# Patient Record
Sex: Male | Born: 1960 | Race: White | Hispanic: No | Marital: Married | Smoking: Current some day smoker
Health system: Southern US, Community
[De-identification: ages and names within clinical notes are randomized; demographics above are authoritative.]

## PROBLEM LIST (undated history)

## (undated) DIAGNOSIS — E119 Type 2 diabetes mellitus without complications: Secondary | ICD-10-CM

## (undated) DIAGNOSIS — I69391 Dysphagia following cerebral infarction: Secondary | ICD-10-CM

## (undated) DIAGNOSIS — I69328 Other speech and language deficits following cerebral infarction: Secondary | ICD-10-CM

## (undated) DIAGNOSIS — I639 Cerebral infarction, unspecified: Secondary | ICD-10-CM

## (undated) DIAGNOSIS — R51 Headache: Secondary | ICD-10-CM

## (undated) DIAGNOSIS — T8859XA Other complications of anesthesia, initial encounter: Secondary | ICD-10-CM

## (undated) DIAGNOSIS — R569 Unspecified convulsions: Secondary | ICD-10-CM

## (undated) DIAGNOSIS — T4145XA Adverse effect of unspecified anesthetic, initial encounter: Secondary | ICD-10-CM

## (undated) DIAGNOSIS — I10 Essential (primary) hypertension: Secondary | ICD-10-CM

## (undated) HISTORY — DX: Cerebral infarction, unspecified: I63.9

## (undated) HISTORY — PX: CYSTECTOMY: SUR359

## (undated) HISTORY — PX: TONSILLECTOMY: SUR1361

## (undated) HISTORY — DX: Other speech and language deficits following cerebral infarction: I69.328

## (undated) HISTORY — DX: Unspecified convulsions: R56.9

## (undated) HISTORY — DX: Type 2 diabetes mellitus without complications: E11.9

## (undated) HISTORY — DX: Dysphagia following cerebral infarction: I69.391

## (undated) HISTORY — PX: HERNIA REPAIR: SHX51

## (undated) HISTORY — PX: WISDOM TOOTH EXTRACTION: SHX21

## (undated) HISTORY — DX: Headache: R51

## (undated) HISTORY — DX: Essential (primary) hypertension: I10

---

## 2005-06-30 ENCOUNTER — Ambulatory Visit: Payer: Self-pay | Admitting: Internal Medicine

## 2005-09-15 HISTORY — PX: CHOLECYSTECTOMY: SHX55

## 2006-03-03 ENCOUNTER — Ambulatory Visit: Payer: Self-pay | Admitting: Internal Medicine

## 2006-04-13 ENCOUNTER — Encounter: Admission: RE | Admit: 2006-04-13 | Discharge: 2006-04-13 | Payer: Self-pay | Admitting: Endocrinology

## 2006-04-17 ENCOUNTER — Ambulatory Visit: Payer: Self-pay | Admitting: Internal Medicine

## 2006-06-04 ENCOUNTER — Ambulatory Visit (HOSPITAL_COMMUNITY): Admission: RE | Admit: 2006-06-04 | Discharge: 2006-06-04 | Payer: Self-pay | Admitting: General Surgery

## 2006-07-01 ENCOUNTER — Encounter (INDEPENDENT_AMBULATORY_CARE_PROVIDER_SITE_OTHER): Payer: Self-pay | Admitting: *Deleted

## 2006-07-01 ENCOUNTER — Ambulatory Visit (HOSPITAL_COMMUNITY): Admission: RE | Admit: 2006-07-01 | Discharge: 2006-07-02 | Payer: Self-pay | Admitting: General Surgery

## 2006-10-21 ENCOUNTER — Ambulatory Visit: Payer: Self-pay | Admitting: Internal Medicine

## 2006-10-21 LAB — CONVERTED CEMR LAB
ALT: 21 units/L (ref 0–53)
AST: 14 units/L (ref 0–37)
Albumin: 4.5 g/dL (ref 3.5–5.2)
Basophils Absolute: 0 10*3/uL (ref 0.0–0.1)
CO2: 24 meq/L (ref 19–32)
Calcium: 9.3 mg/dL (ref 8.4–10.5)
Eosinophils Relative: 2 % (ref 0–5)
Folate: >20 ng/mL
HCT: 45.9 % (ref 39.0–52.0)
Lymphocytes Relative: 39 % (ref 12–46)
Neutro Abs: 4 10*3/uL (ref 1.7–7.7)
Neutrophils Relative %: 50 % (ref 43–77)
Phenytoin Lvl: 10.9 ug/mL (ref 10.0–20.0)
Platelets: 309 10*3/uL (ref 150–400)
Potassium: 4.4 meq/L (ref 3.5–5.3)
RDW: 13 % (ref 11.5–14.0)
Sodium: 139 meq/L (ref 135–145)
Total Protein: 7.6 g/dL (ref 6.0–8.3)

## 2007-03-02 ENCOUNTER — Encounter: Payer: Self-pay | Admitting: Internal Medicine

## 2007-03-02 DIAGNOSIS — R209 Unspecified disturbances of skin sensation: Secondary | ICD-10-CM

## 2007-03-02 DIAGNOSIS — G40309 Generalized idiopathic epilepsy and epileptic syndromes, not intractable, without status epilepticus: Secondary | ICD-10-CM | POA: Insufficient documentation

## 2007-03-02 DIAGNOSIS — I6350 Cerebral infarction due to unspecified occlusion or stenosis of unspecified cerebral artery: Secondary | ICD-10-CM | POA: Insufficient documentation

## 2007-03-05 ENCOUNTER — Encounter (INDEPENDENT_AMBULATORY_CARE_PROVIDER_SITE_OTHER): Payer: Self-pay | Admitting: *Deleted

## 2007-08-03 ENCOUNTER — Encounter: Payer: Self-pay | Admitting: Internal Medicine

## 2008-05-16 ENCOUNTER — Encounter (INDEPENDENT_AMBULATORY_CARE_PROVIDER_SITE_OTHER): Payer: Self-pay | Admitting: *Deleted

## 2008-09-11 ENCOUNTER — Encounter: Payer: Self-pay | Admitting: Internal Medicine

## 2009-02-15 ENCOUNTER — Encounter: Payer: Self-pay | Admitting: Internal Medicine

## 2010-01-28 ENCOUNTER — Encounter: Payer: Self-pay | Admitting: Internal Medicine

## 2010-04-02 ENCOUNTER — Encounter (INDEPENDENT_AMBULATORY_CARE_PROVIDER_SITE_OTHER): Payer: Self-pay | Admitting: *Deleted

## 2010-06-09 ENCOUNTER — Emergency Department (HOSPITAL_COMMUNITY): Admission: EM | Admit: 2010-06-09 | Discharge: 2010-06-09 | Payer: Self-pay | Admitting: Emergency Medicine

## 2010-10-15 NOTE — Letter (Signed)
Summary: Primary Care Appointment Letter  [facility]  [address] [place], [place] [address]   Phone: [phone]  Fax: [phone]    04/02/2010 MRN: [id number]  Bradley Davila [address] [place], [place]  [address]  Dear [name],   Your Primary Care Physician Willow Ora, MD has indicated that:    __X_____it is time to schedule an appointment.    _______you missed your appointment on______ and need to call and          reschedule.    _______you need to have lab work done.    _______you need to schedule an appointment discuss lab or test results.    _______you need to call to reschedule your appointment that is                       scheduled on _________.     Please call our office as soon as possible. Our phone number is [phone]          _________. Please press option 1. Our office is open 8a-12noon and 1p-5p, Monday through Friday.     Thank you,    [facility] Sched[facility]

## 2010-10-15 NOTE — Letter (Signed)
Summary: [facility]  [facility]   Imported By: Lanelle Bal 03/25/2010 12:26:45  _____________________________________________________________________  External Attachment:    Type:   Image     Comment:   External Document

## 2010-11-28 LAB — DIFFERENTIAL
Basophils Absolute: 0 10*3/uL (ref 0.0–0.1)
Lymphocytes Relative: 9 % — ABNORMAL LOW (ref 12–46)
Neutro Abs: 12.9 10*3/uL — ABNORMAL HIGH (ref 1.7–7.7)

## 2010-11-28 LAB — BASIC METABOLIC PANEL
BUN: 13 mg/dL (ref 6–23)
Creatinine, Ser: 1.02 mg/dL (ref 0.4–1.5)
GFR calc non Af Amer: >60 mL/min (ref >60)
Potassium: 4 mEq/L (ref 3.5–5.1)

## 2010-11-28 LAB — CBC
HCT: 45.3 % (ref 39.0–52.0)
Platelets: 303 10*3/uL (ref 150–400)
RDW: 13.4 % (ref 11.5–15.5)
WBC: 14.8 10*3/uL — ABNORMAL HIGH (ref 4.0–10.5)

## 2010-11-28 LAB — GLUCOSE, CAPILLARY: Glucose-Capillary: 96 mg/dL (ref 70–99)

## 2011-01-31 NOTE — Op Note (Signed)
NAME:  NYRON, MOZER NO.:  [id number]   MEDICAL RECORD NO.:  [id number]          PATIENT TYPE:  OIB   LOCATION:  1607                         FACILITY:  [facility]   PHYSICIAN:  Leonie Man, M.D.   DATE OF BIRTH:  08-23-1961   DATE OF PROCEDURE:  07/01/2006  DATE OF DISCHARGE:  07/02/2006                                 OPERATIVE REPORT   PREOPERATIVE DIAGNOSIS:  Chronic calculous cholecystitis.   POSTOPERATIVE DIAGNOSIS:  Chronic calculous cholecystitis.   PROCEDURE:  Laparoscopic cholecystectomy with intraoperative cholangiogram.   SURGEON:  Leonie Man, M.D.   ASSISTANT:  Baruch Merl, M.D.   ANESTHESIA:  General.   INDICATION:  The patient is a 50 year old man with a congenital tongue  deformity, who presents with persistent epigastric pain, nausea and  vomiting, ultrasonic evaluation showing multiple gallstones within the  gallbladder.  The patient's liver function studies are within normal limits.  He comes to the operating room now after risks and potential benefits of  surgery have been discussed, all questions answered and consent obtained.   PROCEDURE:  Following the induction of satisfactory anesthesia, the patient  was positioned supinely.  The abdomen was prepped and draped to be included  in a sterile operative field.  Open laparoscopy was created at the umbilicus  with insufflation of the peritoneal cavity to a maximum of 14 mmHg pressure  using carbon dioxide.  Camera was inserted and visual exploration of the  abdomen carried out.  Gallbladder was moderately chronically scarred.  Liver  edges were slightly rounded.  There were no lesions on the liver.  The  duodenal sweep appeared to be normal.  All small or large intestine viewed  appeared to be normal.   Epigastric and lateral ports were placed; the gallbladder was then grasped  and retracted cephalad.  Dissection was carried down to the region of the  ampulla with isolation of the  cystic artery and cystic duct.  The cystic  duct was clipped proximally and cystic artery was doubly clipped.  Cystic  duct cholangiogram was then carried out with 1/2-strength Renografin under  fluoroscopic control, resulting cholangiogram showing free flow of contrast  into the duodenum, no filling defects within the extrahepatic ducts.  The  cholangiocatheter was then removed from the gallbladder and the cystic duct  was doubly clipped and transected, the cystic artery doubly clipped and  transected in the gallbladder dissected free from the liver bed using  electrocautery and maintaining hemostasis throughout the course of the  dissection.  At the end of the dissection, the gallbladder is placed in an  Endopouch and retrieved through the umbilical port without difficulty.  The  right upper quadrant and subhepatic space were thoroughly irrigated with  multiple aliquots of normal saline, additional bleeding points treated with  electrocautery.  The pneumoperitoneum was then allowed to deflate and the  trocars removed under direct vision.  Sponge and instrument counts were  verified.  All wounds were closed in layers as follows with the 3-0 Vicryl  and 4-0 Monocryl for the  umbilicus.  The epigastric and lateral flank  wounds were both closed with 4-  0 Monocryl.  All wounds were reinforced with Steri-Strips and sterile  dressings applied, anesthetic reversed and the patient removed from the  operating room to the recovery room in stable condition.  He tolerated the  procedure well.      Leonie Man, M.D.  Electronically Signed     PB/MEDQ  D:  07/01/2006  T:  07/03/2006  Job:  [id number]

## 2011-04-17 ENCOUNTER — Ambulatory Visit (INDEPENDENT_AMBULATORY_CARE_PROVIDER_SITE_OTHER): Payer: BC Managed Care – PPO | Admitting: Family Medicine

## 2011-04-17 ENCOUNTER — Encounter: Payer: Self-pay | Admitting: Family Medicine

## 2011-04-17 DIAGNOSIS — I6350 Cerebral infarction due to unspecified occlusion or stenosis of unspecified cerebral artery: Secondary | ICD-10-CM

## 2011-04-17 DIAGNOSIS — Z8673 Personal history of transient ischemic attack (TIA), and cerebral infarction without residual deficits: Secondary | ICD-10-CM

## 2011-04-17 DIAGNOSIS — H9190 Unspecified hearing loss, unspecified ear: Secondary | ICD-10-CM | POA: Insufficient documentation

## 2011-04-17 DIAGNOSIS — L989 Disorder of the skin and subcutaneous tissue, unspecified: Secondary | ICD-10-CM

## 2011-04-17 DIAGNOSIS — R21 Rash and other nonspecific skin eruption: Secondary | ICD-10-CM

## 2011-04-17 DIAGNOSIS — G40309 Generalized idiopathic epilepsy and epileptic syndromes, not intractable, without status epilepticus: Secondary | ICD-10-CM

## 2011-04-17 MED ORDER — EPINEPHRINE 0.3 MG/0.3ML IJ DEVI: 0.3000 mg | Freq: Once | INTRAMUSCULAR | Status: DC

## 2011-04-17 MED ORDER — NYSTATIN 100000 UNIT/GM EX CREA: TOPICAL_CREAM | Freq: Two times a day (BID) | CUTANEOUS | Status: AC

## 2011-04-17 NOTE — Progress Notes (Deleted)
  Subjective:    Patient ID: Bradley Davila, male    DOB: October 13, 1960, 50 y.o.   MRN: [id number]  HPI    Review of Systems     Objective:   Physical Exam        Assessment & Plan:

## 2011-04-17 NOTE — Patient Instructions (Signed)
See Shirlee Limerick about your referral before your leave today. Use the nystatin twice a day on your armpits.  When the rash is better, schedule a office visit for me to take of the spot on your back and armpit.  We'll request your records.  Take care.  Glad to see you.  I would get a flu shot each fall.

## 2011-04-18 ENCOUNTER — Encounter: Payer: Self-pay | Admitting: Family Medicine

## 2011-04-18 NOTE — Assessment & Plan Note (Signed)
Requesting records.   

## 2011-04-18 NOTE — Assessment & Plan Note (Signed)
Likely irritated SK with skin tags.  Return for removal after axillary rash resolved .

## 2011-04-18 NOTE — Assessment & Plan Note (Signed)
Per neuro 

## 2011-04-18 NOTE — Assessment & Plan Note (Signed)
Refer for ENT eval.  Use irrigation at home for wax.

## 2011-04-18 NOTE — Progress Notes (Signed)
New pt to est care.  Requesting records.  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  Didn't hit dev milestones and developed SZ d/o.  Last gran mal 05/2011.  Followed by Neuro at [facility].  Dysphagia and speech changed.  Dec in grip at baseline.  Trouble with closing mouth--> drooling.  Now functionally L handed.  H/o occ mild dysphagia, but no recent choking.   Skin lesions.  Wants eval.  Dec in hearing, prev with high frequency loss on screen, but I don't have records yet.  No change in hearing with wax removal prev  PMH and SH reviewed  ROS: See HPI, otherwise noncontributory.  Meds, vitals, and allergies reviewed.   Nad, hard of hearing, speech at baseline per family ncat Mmm rrr ctab Inc in tone on R side but with sym DTRs.  Sensation grossly intact x4 Red rash in B axilla with satellite lesions. Skin tags noted in axilla SKs noted on back but 1 lesions has variable pigmentation.  On L side of back, ~41mm across.   Tm obscured by wax x2

## 2011-04-18 NOTE — Assessment & Plan Note (Signed)
Likely fungal.  Use nystatin and return as above.

## 2011-04-20 ENCOUNTER — Encounter: Payer: Self-pay | Admitting: Family Medicine

## 2011-05-11 ENCOUNTER — Encounter: Payer: Self-pay | Admitting: Family Medicine

## 2011-05-11 DIAGNOSIS — G809 Cerebral palsy, unspecified: Secondary | ICD-10-CM | POA: Insufficient documentation

## 2011-07-03 ENCOUNTER — Encounter: Payer: Self-pay | Admitting: Family Medicine

## 2013-03-21 DIAGNOSIS — G40209 Localization-related (focal) (partial) symptomatic epilepsy and epileptic syndromes with complex partial seizures, not intractable, without status epilepticus: Secondary | ICD-10-CM | POA: Diagnosis not present

## 2013-03-30 ENCOUNTER — Ambulatory Visit (INDEPENDENT_AMBULATORY_CARE_PROVIDER_SITE_OTHER)
Admission: RE | Admit: 2013-03-30 | Discharge: 2013-03-30 | Disposition: A | Discharge status: Home / Self Care | Payer: BC Managed Care – PPO | Source: Ambulatory Visit | Attending: Family Medicine | Admitting: Family Medicine

## 2013-03-30 ENCOUNTER — Ambulatory Visit (INDEPENDENT_AMBULATORY_CARE_PROVIDER_SITE_OTHER): Payer: BC Managed Care – PPO | Admitting: Family Medicine

## 2013-03-30 ENCOUNTER — Encounter: Payer: Self-pay | Admitting: Family Medicine

## 2013-03-30 VITALS — BP 120/70 | HR 85 | Temp 98.2°F | Ht 66.0 in | Wt 237.2 lb

## 2013-03-30 DIAGNOSIS — M25569 Pain in unspecified knee: Secondary | ICD-10-CM | POA: Diagnosis not present

## 2013-03-30 DIAGNOSIS — M25561 Pain in right knee: Secondary | ICD-10-CM

## 2013-03-30 MED ORDER — MELOXICAM 15 MG PO TABS: 15.0000 mg | ORAL_TABLET | Freq: Every day | ORAL | Status: DC

## 2013-03-30 MED ORDER — EPINEPHRINE 0.3 MG/0.3ML IJ SOAJ: 0.3000 mg | Freq: Once | INTRAMUSCULAR | Status: DC

## 2013-03-30 NOTE — Progress Notes (Signed)
Nature conservation officer at [facility] [address] [place] [address] Phone: [phone] Fax: [phone]  Date:  03/30/2013   Name:  Bradley Davila   DOB:  [birth year]   MRN:  [id number] Gender: male Age: 52 y.o.  Primary Physician:  Crawford Givens, MD  Evaluating MD: Hannah Beat, MD   Chief Complaint: Knee Pain   History of Present Illness:  Bradley Davila is a 52 y.o. pleasant patient who presents with the following:  68 yp s/p childhood CVA and CP with R knee pain.  R knee: Hurting kind of all over.  Has been walking to lose weight.  Walking seems to make it hurt more, hurts afterward.   Sometimes knee will swell Ice to help with swelling No prior operations.  No mechanical buckling, falling.  No known trauma    Patient Active Problem List   Diagnosis Date Noted  . Cerebral palsy 05/11/2011  . Hearing loss 04/17/2011  . Rash 04/17/2011  . Skin lesion 04/17/2011  . 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 STROKE 03/02/2007    Past Medical History  Diagnosis Date  . 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 Seizures     h/o grand mal and staring episodes, followed by [facility] neuro  . Headache(784.0)   . Dysphagia as late effect of stroke   . 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 Cholecystectomy    . Cystectomy      on buttock, s/p removal    History   Social History  . Marital Status: Married    Spouse Name: N/A    Number of Children: N/A  . Years of Education: N/A   Occupational History  . Not on file.   Social History Main Topics  . Smoking status: Never Smoker   . Smokeless tobacco: Not on file  . Alcohol Use: No  . Drug Use: No  . Sexually Active: Not on file   Other Topics Concern  . Not on file   Social History Narrative   Married 2005, 1 daughter   Disabled due to cva    Family History    Problem Relation Age of Onset  . Hydrocephalus Mother   . Liver cancer Father   . Alcohol abuse Father   . Alcohol abuse Sister   . Heart disease Brother   . Colon cancer Neg Hx   . Prostate cancer Neg Hx     Allergies  Allergen Reactions  . Benadryl (Diphenhydramine Hcl)     Caused seizures.   . Yellow Jacket Venom (Honey Bee Venom)     Facial swelling after sting  . Carbamazepine     Inc in seizures  . Lamotrigine     Inc in seizures  . Phenobarbital     rash    Medication list has been reviewed and updated.  Outpatient Prescriptions Prior to Visit  Medication Sig Dispense Refill  . EPINEPHrine (EPIPEN) 0.3 mg/0.3 mL DEVI Inject 0.3 mLs (0.3 mg total) into the muscle once.  2 Device  1  . levETIRAcetam (KEPPRA) 1000 MG tablet Take 1,000 mg by mouth. Take one in am and 1 1/2 in evening       . oxcarbazepine (TRILEPTAL) 600 MG tablet Take 600 mg by mouth 2 (two) times daily.        Marland Kitchen  Misc Natural Products (GINKOGIN PO) Take 120 mg by mouth daily.         No facility-administered medications prior to visit.    Review of Systems:   GEN: No fevers, chills. Nontoxic. Primarily MSK c/o today. MSK: Detailed in the HPI GI: tolerating PO intake without difficulty Neuro: No numbness, parasthesias, or tingling associated. Otherwise the pertinent positives of the ROS are noted above.    Physical Examination: BP 120/70  Pulse 85  Temp(Src) 98.2 F (36.8 C) (Oral)  Ht 5\' 6"  (1.676 m)  Wt 237 lb 4 oz (107.616 kg)  BMI 38.31 kg/m2  SpO2 97%  Ideal Body Weight: Weight in (lb) to have BMI = 25: 154.6   GEN: WDWN, NAD, Non-toxic, Alert & Oriented x 3 HEENT: Atraumatic, Normocephalic.  Ears and Nose: No external deformity. EXTR: No clubbing/cyanosis/edema NEURO: Normal gait.  PSYCH: Normally interactive. Conversant. Not depressed or anxious appearing.  Calm demeanor.   Knee:  R Gait: Normal heel toe pattern ROM: 0-110 Effusion: mild-mod effusion Echymosis or edema:  none Patellar tendon NT Painful PLICA: neg Patellar grind: + Medial and lateral patellar facet loading: mild t medial and lateral joint lines: medial > lateral pain Mcmurray's pain Flexion-pinch pain Varus and valgus stress: stable Lachman: neg Ant and Post drawer: neg Hip abduction, IR, ER: WNL Hip flexion str: 5/5 Hip abd: 5/5 Quad: 5/5 VMO atrophy:No Hamstring concentric and eccentric: 5/5   Dg Knee Ap/lat W/sunrise Right  03/30/2013   *RADIOLOGY REPORT*  Clinical Data: Right knee pain after fall.  DG KNEE - 3 VIEWS  Comparison: None.  Findings: No acute bony or joint abnormality is identified.  Tiny osteophyte off the lateral patellar facet is incidentally noted. No joint effusion is seen.  IMPRESSION: No acute finding.   Original Report Authenticated By: Holley Dexter, M.D.    Assessment and Plan:  Right knee pain - Plan: DG Knee AP/LAT W/Sunrise Right  Conservative in this patient. More likely degenerative meniscal, some PF compartment component as well.   Ice, NSAIDs, fitted with a patellar-J brace  F/u 4-5 weeks if not improving  Orders Today:  Orders Placed This Encounter  Procedures  . DG Knee AP/LAT W/Sunrise Right    Standing Status: Future     Number of Occurrences: 1     Standing Expiration Date: 05/30/2014    Order Specific Question:  Reason for exam:    Answer:  R knee pain,    Order Specific Question:  Preferred imaging location?    Answer:  [facility]    Updated Medication List: (Includes new medications, updates to list, dose adjustments) Meds ordered this encounter  Medications  . meloxicam (MOBIC) 15 MG tablet    Sig: Take 1 tablet (15 mg total) by mouth daily.    Dispense:  30 tablet    Refill:  2  . EPINEPHrine (EPIPEN) 0.3 mg/0.3 mL SOAJ    Sig: Inject 0.3 mLs (0.3 mg total) into the muscle once.    Dispense:  2 Device    Refill:  0    Medications Discontinued: Medications Discontinued During This Encounter  Medication  Reason  . Misc Natural Products (GINKOGIN PO) Error  . EPINEPHrine (EPIPEN) 0.3 mg/0.3 mL DEVI       Signed, Delfino Friesen T. Suriah Peragine, MD 03/30/2013 3:57 PM

## 2013-07-15 ENCOUNTER — Ambulatory Visit (INDEPENDENT_AMBULATORY_CARE_PROVIDER_SITE_OTHER): Payer: BC Managed Care – PPO | Admitting: Family Medicine

## 2013-07-15 ENCOUNTER — Encounter: Payer: Self-pay | Admitting: Family Medicine

## 2013-07-15 VITALS — BP 122/82 | HR 95 | Temp 98.1°F | Wt 235.2 lb

## 2013-07-15 DIAGNOSIS — E119 Type 2 diabetes mellitus without complications: Secondary | ICD-10-CM | POA: Diagnosis not present

## 2013-07-15 DIAGNOSIS — R739 Hyperglycemia, unspecified: Secondary | ICD-10-CM

## 2013-07-15 DIAGNOSIS — R7309 Other abnormal glucose: Secondary | ICD-10-CM

## 2013-07-15 LAB — COMPREHENSIVE METABOLIC PANEL
ALT: 37 U/L (ref 0–53)
CO2: 28 mEq/L (ref 19–32)
Calcium: 9.2 mg/dL (ref 8.4–10.5)
Chloride: 99 mEq/L (ref 96–112)
GFR: 93.58 mL/min (ref >60.00)
Sodium: 135 mEq/L (ref 135–145)
Total Protein: 7.7 g/dL (ref 6.0–8.3)

## 2013-07-15 NOTE — Progress Notes (Signed)
Thirsty, sugars up to 200-300 at home, wife had checked.  Peeing more often.  Started in about 2 months ago.  No FCNAVD.  Had been drinking soda but they tried to cut that back.  Discussed path/phys and diet options.    Meds, vitals, and allergies reviewed.   ROS: See HPI.  Otherwise, noncontributory.  Drooling and speech at baseline nad rrr ctab abd soft Ext w/o edema

## 2013-07-15 NOTE — Patient Instructions (Signed)
Go to the lab on the way out.  We'll contact you with your lab report. Avoid sugary drinks and bread/pastries/chips/white pasta for now.

## 2013-07-17 ENCOUNTER — Other Ambulatory Visit: Payer: Self-pay | Admitting: Family Medicine

## 2013-07-17 DIAGNOSIS — E1169 Type 2 diabetes mellitus with other specified complication: Secondary | ICD-10-CM | POA: Insufficient documentation

## 2013-07-17 DIAGNOSIS — E119 Type 2 diabetes mellitus without complications: Secondary | ICD-10-CM

## 2013-07-17 DIAGNOSIS — E1165 Type 2 diabetes mellitus with hyperglycemia: Secondary | ICD-10-CM | POA: Insufficient documentation

## 2013-07-17 MED ORDER — METFORMIN HCL 500 MG PO TABS: ORAL_TABLET | ORAL | Status: DC

## 2013-07-17 NOTE — Assessment & Plan Note (Signed)
New dx, path/phys d/w pt and wife.  Will need low carb diet, close f/u; see notes on labs. >25 min spent with face to face with patient, >50% counseling and/or coordinating care.

## 2013-07-19 ENCOUNTER — Other Ambulatory Visit: Payer: Self-pay | Admitting: Family Medicine

## 2013-07-19 DIAGNOSIS — E119 Type 2 diabetes mellitus without complications: Secondary | ICD-10-CM

## 2013-08-23 ENCOUNTER — Ambulatory Visit: Payer: Medicare Other

## 2013-08-30 ENCOUNTER — Ambulatory Visit: Payer: Medicare Other

## 2013-09-06 ENCOUNTER — Ambulatory Visit: Payer: Medicare Other

## 2013-10-04 ENCOUNTER — Encounter: Payer: BC Managed Care – PPO | Attending: Family Medicine

## 2013-10-04 ENCOUNTER — Encounter (INDEPENDENT_AMBULATORY_CARE_PROVIDER_SITE_OTHER): Payer: Self-pay

## 2013-10-04 VITALS — Ht 66.0 in | Wt 227.0 lb

## 2013-10-04 DIAGNOSIS — Z713 Dietary counseling and surveillance: Secondary | ICD-10-CM | POA: Diagnosis not present

## 2013-10-04 DIAGNOSIS — E119 Type 2 diabetes mellitus without complications: Secondary | ICD-10-CM | POA: Diagnosis not present

## 2013-10-05 NOTE — Progress Notes (Signed)
Patient was seen on 10/04/13 for the first of a series of three diabetes self-management courses at [facility].  Current HbA1c: 10.0%  The following learning objectives were met by the patient during this class:  Describe diabetes  State some common risk factors for diabetes  Defines the role of glucose and insulin  Identifies type of diabetes and pathophysiology  Describe the relationship between diabetes and cardiovascular risk  State the members of the Healthcare Team  States the rationale for glucose monitoring  State when to test glucose  State their individual Target Range  State the importance of logging glucose readings  Describe how to interpret glucose readings  Identifies A1C target  Explain the correlation between A1c and eAG values  State symptoms and treatment of high blood glucose  State symptoms and treatment of low blood glucose  Explain proper technique for glucose testing  Identifies proper sharps disposal  Handouts given during class include:  Living Well with Diabetes book  Carb Counting and Meal Planning book  Meal Plan Card  Carbohydrate guide  Meal planning worksheet  Low Sodium Flavoring Tips  The diabetes portion plate  Z3G to eAG Conversion Chart  Diabetes Medications  Stress Management  Diabetes Recommended Care Schedule  Diabetes Success Plan  Core Class Satisfaction Survey  Follow-Up Plan:  Attend core 2

## 2013-10-11 ENCOUNTER — Other Ambulatory Visit (INDEPENDENT_AMBULATORY_CARE_PROVIDER_SITE_OTHER): Payer: BC Managed Care – PPO

## 2013-10-11 DIAGNOSIS — E119 Type 2 diabetes mellitus without complications: Secondary | ICD-10-CM | POA: Diagnosis not present

## 2013-10-11 LAB — HEMOGLOBIN A1C: HEMOGLOBIN A1C: 6.9 % — AB (ref 4.6–6.5)

## 2013-10-11 LAB — MICROALBUMIN / CREATININE URINE RATIO
Creatinine,U: 184.7 mg/dL
MICROALB UR: 3.3 mg/dL — AB (ref 0.0–1.9)
MICROALB/CREAT RATIO: 1.8 mg/g (ref 0.0–30.0)

## 2013-10-12 NOTE — Progress Notes (Signed)

## 2013-10-13 ENCOUNTER — Other Ambulatory Visit: Payer: Medicare Other

## 2013-10-17 ENCOUNTER — Encounter: Payer: Self-pay | Admitting: Family Medicine

## 2013-10-17 ENCOUNTER — Ambulatory Visit (INDEPENDENT_AMBULATORY_CARE_PROVIDER_SITE_OTHER): Payer: BC Managed Care – PPO | Admitting: Family Medicine

## 2013-10-17 VITALS — BP 118/74 | HR 68 | Temp 98.4°F | Wt 224.5 lb

## 2013-10-17 DIAGNOSIS — E119 Type 2 diabetes mellitus without complications: Secondary | ICD-10-CM | POA: Diagnosis not present

## 2013-10-17 DIAGNOSIS — G40209 Localization-related (focal) (partial) symptomatic epilepsy and epileptic syndromes with complex partial seizures, not intractable, without status epilepticus: Secondary | ICD-10-CM | POA: Diagnosis not present

## 2013-10-17 MED ORDER — METFORMIN HCL 500 MG PO TABS: ORAL_TABLET | ORAL | Status: DC

## 2013-10-17 NOTE — Patient Instructions (Addendum)
Call about getting an eye exam.   Recheck in about 3 months.  Cut back on the metformin (500mg  twice a day) if needed.  I would get a flu and pneumonia shot in the near future.  Take care.  Glad to see you.

## 2013-10-17 NOTE — Assessment & Plan Note (Signed)
Declined flu and PNA vaccine.  A1c improved, continue work on diet.  May need to cut metformin to 1000mg  a day, based on sugars and GI tolerance. Recheck in 3 months.  He agrees. Labs d/w pt.

## 2013-10-17 NOTE — Progress Notes (Signed)
Pre-visit discussion using our clinic review tool. No additional management support is needed unless otherwise documented below in the visit note.  Diabetes:  Using medications without difficulties:yes Hypoglycemic episodes:no Hyperglycemic episodes:no Feet problems:no Blood Sugars averaging: ~120-140s eye exam within last year: due, discussed.   A1c much improved.  Cut out soda and kool aid, feels much better.    Declined vaccination today, "I don't trust them."  PMH and SH reviewed  ROS: See HPI, otherwise noncontributory.  Meds, vitals, and allergies reviewed.    GEN: nad, alert and oriented, speech at baseline HEENT: mucous membranes moist NECK: supple w/o LA CV: rrr. PULM: ctab, no inc wob ABD: soft, +bs EXT: no edema  Diabetic foot exam: Normal inspection No skin breakdown No calluses  Normal DP pulses Normal sensation to light touch and monofilament Nails normal

## 2013-10-18 ENCOUNTER — Encounter: Payer: BC Managed Care – PPO | Attending: Family Medicine

## 2013-10-18 DIAGNOSIS — E119 Type 2 diabetes mellitus without complications: Secondary | ICD-10-CM | POA: Diagnosis not present

## 2013-10-18 DIAGNOSIS — Z713 Dietary counseling and surveillance: Secondary | ICD-10-CM | POA: Diagnosis not present

## 2013-10-18 NOTE — Progress Notes (Signed)
Patient was seen on 10/18/13 for the third of a series of three diabetes self-management courses at the [facility]. The following learning objectives were met by the patient during this class:    State the amount of activity recommended for healthy living   Describe activities suitable for individual needs   Identify ways to regularly incorporate activity into daily life   Identify barriers to activity and ways to over come these barriers  Identify diabetes medications being personally used and their primary action for lowering glucose and possible side effects   Describe role of stress on blood glucose and develop strategies to address psychosocial issues   Identify diabetes complications and ways to prevent them  Explain how to manage diabetes during illness   Evaluate success in meeting personal goal   Establish 2-3 goals that they will plan to diligently work on until they return for the  45-month follow-up visit  Goals:  Follow Diabetes Meal Plan as instructed  Aim for 15-30 mins of physical activity daily as tolerated  Bring food record and glucose log to your follow up visit  Your patient has established the following 4 month goals in their individualized success plan: I will count my carb choices at most meals and snacks I will increase my activity level at least 5 days a week I will take my diabetes medications as scheduled  I will stop smoking by 2 years  Your patient has identified these potential barriers to change:  None stated  Your patient has identified their diabetes self-care support plan as  None stated

## 2013-10-20 ENCOUNTER — Telehealth: Payer: Self-pay

## 2013-10-20 NOTE — Telephone Encounter (Signed)
Relevant patient education mailed to patient.  

## 2014-01-11 ENCOUNTER — Ambulatory Visit (INDEPENDENT_AMBULATORY_CARE_PROVIDER_SITE_OTHER): Payer: BC Managed Care – PPO | Admitting: Family Medicine

## 2014-01-11 ENCOUNTER — Encounter: Payer: Self-pay | Admitting: Family Medicine

## 2014-01-11 VITALS — BP 124/80 | HR 74 | Temp 98.4°F | Wt 227.0 lb

## 2014-01-11 DIAGNOSIS — R1032 Left lower quadrant pain: Secondary | ICD-10-CM | POA: Diagnosis not present

## 2014-01-11 MED ORDER — METRONIDAZOLE 500 MG PO TABS: 500.0000 mg | ORAL_TABLET | Freq: Three times a day (TID) | ORAL | Status: DC

## 2014-01-11 MED ORDER — CIPROFLOXACIN HCL 500 MG PO TABS
500.0000 mg | ORAL_TABLET | Freq: Two times a day (BID) | ORAL | Status: DC
Start: 2014-01-11 — End: 2014-02-24

## 2014-01-11 NOTE — Patient Instructions (Signed)
Start the antibiotics today.  Go to the lab on the way out.  We'll contact you with your lab report. Rosaria Ferries will call about your referral. Notify us if not improving in a few days.  If profound pain or bleeding, then to ER.  Take care.

## 2014-01-11 NOTE — Progress Notes (Signed)
Pre visit review using our clinic review tool, if applicable. No additional management support is needed unless otherwise documented below in the visit note.  Rectal bleeding.  He had some LLQ pain for a few days.  His sx resolved and then Monday of this week he had some blood in stool.  Going on for about three days.  Noted again today.  BRBPR.  No pain now.  No fevers.  No vomiting.  Hard stools recently.  No rectal pain.    Meds, vitals, and allergies reviewed.   ROS: See HPI.  Otherwise, noncontributory.  nad Speech at baseline rrr ctab abd soft, mildly ttp in LLQ w/o rebound, normal BS Ext well perfused.  Rectal exam w/o gross blood or hemorrhoids noted.

## 2014-01-11 NOTE — Assessment & Plan Note (Signed)
Concern for mild diverticulitis.  No hemorrhoid to assign the bleeding to.  D/w pt and wife.  Would start abx with routine cautions and refer pt to GI for colon cancer screening given the gross blood prev.  He agrees.

## 2014-01-12 LAB — CBC WITH DIFFERENTIAL/PLATELET
Basophils Absolute: 0.1 10*3/uL (ref 0.0–0.1)
Basophils Relative: 0.8 % (ref 0.0–3.0)
EOS PCT: 1.1 % (ref 0.0–5.0)
Eosinophils Absolute: 0.1 10*3/uL (ref 0.0–0.7)
HEMATOCRIT: 45.8 % (ref 39.0–52.0)
HEMOGLOBIN: 15.3 g/dL (ref 13.0–17.0)
LYMPHS ABS: 2.9 10*3/uL (ref 0.7–4.0)
Lymphocytes Relative: 24.6 % (ref 12.0–46.0)
MCHC: 33.4 g/dL (ref 30.0–36.0)
MCV: 86.8 fl (ref 78.0–100.0)
MONO ABS: 0.6 10*3/uL (ref 0.1–1.0)
MONOS PCT: 4.7 % (ref 3.0–12.0)
NEUTROS ABS: 8.1 10*3/uL — AB (ref 1.4–7.7)
Neutrophils Relative %: 68.8 % (ref 43.0–77.0)
Platelets: 383 10*3/uL (ref 150.0–400.0)
RBC: 5.28 Mil/uL (ref 4.22–5.81)
RDW: 13.6 % (ref 11.5–14.6)
WBC: 11.8 10*3/uL — AB (ref 4.5–10.5)

## 2014-01-12 LAB — COMPREHENSIVE METABOLIC PANEL
ALK PHOS: 78 U/L (ref 39–117)
ALT: 23 U/L (ref 0–53)
AST: 18 U/L (ref 0–37)
Albumin: 4.1 g/dL (ref 3.5–5.2)
BILIRUBIN TOTAL: 0.2 mg/dL — AB (ref 0.3–1.2)
BUN: 13 mg/dL (ref 6–23)
CO2: 30 meq/L (ref 19–32)
CREATININE: 1.1 mg/dL (ref 0.4–1.5)
Calcium: 9.2 mg/dL (ref 8.4–10.5)
Chloride: 101 mEq/L (ref 96–112)
GFR: 74.88 mL/min (ref >60.00)
Glucose, Bld: 106 mg/dL — ABNORMAL HIGH (ref 70–99)
Potassium: 4.5 mEq/L (ref 3.5–5.1)
SODIUM: 137 meq/L (ref 135–145)
Total Protein: 8 g/dL (ref 6.0–8.3)

## 2014-01-18 ENCOUNTER — Encounter: Payer: Self-pay | Admitting: Internal Medicine

## 2014-02-24 ENCOUNTER — Ambulatory Visit (INDEPENDENT_AMBULATORY_CARE_PROVIDER_SITE_OTHER): Payer: BC Managed Care – PPO | Admitting: Internal Medicine

## 2014-02-24 ENCOUNTER — Encounter: Payer: Self-pay | Admitting: Internal Medicine

## 2014-02-24 VITALS — BP 110/70 | HR 66 | Ht 66.0 in | Wt 227.4 lb

## 2014-02-24 DIAGNOSIS — E119 Type 2 diabetes mellitus without complications: Secondary | ICD-10-CM

## 2014-02-24 DIAGNOSIS — K625 Hemorrhage of anus and rectum: Secondary | ICD-10-CM

## 2014-02-24 DIAGNOSIS — R109 Unspecified abdominal pain: Secondary | ICD-10-CM

## 2014-02-24 MED ORDER — MOVIPREP 100 G PO SOLR: 1.0000 | Freq: Once | ORAL | Status: DC

## 2014-02-24 NOTE — Patient Instructions (Addendum)
You have been scheduled for a CT scan of the abdomen and pelvis at [facility] ([address]---this is in the same building as Press photographer).   You are scheduled on 03/02/2014 at 11:00am. You should arrive 15 minutes prior to your appointment time for registration. Please follow the written instructions below on the day of your exam:  WARNING: IF YOU ARE ALLERGIC TO IODINE/X-RAY DYE, PLEASE NOTIFY RADIOLOGY IMMEDIATELY AT [phone]! YOU WILL BE GIVEN A 13 HOUR PREMEDICATION PREP.  1) Do not eat or drink anything after 7:00am (4 hours prior to your test) 2) You have been given 2 bottles of oral contrast to drink. The solution may taste better if refrigerated, but do NOT add ice or any other liquid to this solution. Shake well before drinking.    Drink 1 bottle of contrast @ 9:00am (2 hours prior to your exam)  Drink 1 bottle of contrast @ 10:00am(1 hour prior to your exam)  You may take any medications as prescribed with a small amount of water except for the following: Metformin, Glucophage, Glucovance, Avandamet, Riomet, Fortamet, Actoplus Met, Janumet, Glumetza or Metaglip. The above medications must be held the day of the exam AND 48 hours after the exam.  The purpose of you drinking the oral contrast is to aid in the visualization of your intestinal tract. The contrast solution may cause some diarrhea. Before your exam is started, you will be given a small amount of fluid to drink. Depending on your individual set of symptoms, you may also receive an intravenous injection of x-ray contrast/dye. Plan on being at [facility] for 30 minutes or long, depending on the type of exam you are having performed.  If you have any questions regarding your exam or if you need to reschedule, you may call the CT department at [phone] between the hours of 8:00 am and 5:00 pm, Monday-Friday.   You have been scheduled for a colonoscopy with propofol. Please follow written  instructions given to you at your visit today.  Please pick up your prep kit at the pharmacy within the next 1-3 days. If you use inhalers (even only as needed), please bring them with you on the day of your procedure. Your physician has requested that you go to www.startemmi.com and enter the access code given to you at your visit today. This web site gives a general overview about your procedure. However, you should still follow specific instructions given to you by our office regarding your preparation for the procedure.   ________________________________________________________________________

## 2014-02-24 NOTE — Progress Notes (Signed)
HISTORY OF PRESENT ILLNESS:  Bradley Davila is a 53 y.o. 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 Damita Dunnings as a new GI patient regarding left lower quadrant pain, rectal bleeding, and the need for colonoscopy. He is accompanied by his wife. Patient reports having undergone a colonoscopy greater than 15 years ago. Apparently no abnormalities. In December 2014 he developed left lower quadrant pain. His diet was modified for a few days and that resolved. The pain returned in March along with rectal bleeding. Since that time he has had intermittent waves of lower abdominal discomfort. No nausea, vomiting, or weight loss. Review of outside laboratories from April 2015 revealed essentially normal CBC with differential and comprehensive metabolic panel. Hemoglobin A1c 6.9  REVIEW OF SYSTEMS:  All non-GI ROS negative except for depression, hearing problems, itching, muscle cramps, sleeping problems Past Medical History  Diagnosis Date  . 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 Seizures     h/o grand mal and staring episodes, followed by [facility] neuro  . Headache(784.0)   . Dysphagia as late effect of stroke   . 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 Cholecystectomy    . Cystectomy      on buttock, s/p removal    Social History Bradley Davila  reports that he has quit smoking. He has never used smokeless tobacco. He reports that he does not drink alcohol or use illicit drugs.  family history includes Alcohol abuse in his father and sister; Heart disease in his brother; Hydrocephalus in his mother; Liver cancer in his father. There is no history of Colon cancer or Prostate cancer.  Allergies  Allergen Reactions  . Benadryl [Diphenhydramine Hcl]    Caused seizures.   Jaquelyn Bitter Jacket Venom [Honey Bee Venom]     Facial swelling after sting  . Carbamazepine     Inc in seizures  . Lamotrigine     Inc in seizures  . Metformin And Related     Tolerates 1557m but not 20035ma day  . Phenobarbital     rash       PHYSICAL EXAMINATION: Vital signs: BP 110/70  Pulse 66  Ht _0  (1.676 m)  Wt 227 lb 6.4 oz (103.148 kg)  BMI 36.72 kg/m2  Constitutional: generally well-appearing, no acute distress Psychiatric: alert and oriented x3, cooperative Eyes: extraocular movements intact, anicteric, conjunctiva pink Mouth: oral pharynx moist, no lesions. Poor dentition Neck: supple no lymphadenopathy Cardiovascular: heart regular rate and rhythm, no murmur Lungs: clear to auscultation bilaterally Abdomen: soft, nontender, nondistended, no obvious ascites, no peritoneal signs, normal bowel sounds, no organomegaly Rectal: Deferred until colonoscopy Extremities: no lower extremity edema bilaterally Skin: no lesions on visible extremities Neuro:  Slurred speech.   ASSESSMENT:  #1. Six-month history of intermittent and progressive lower abdominal pain #2. Intermittent rectal bleeding #3. [place] colon[place] greater than [place] ago [place] #4. Multiple medical problems including diabetes  PLAN:  #1. Contrast-enhanced CT scan of the abdomen and pelvis #2. Colonoscopy thereafter. Movi prep prescribed.The nature of the procedure, as well as the risks, benefits, and alternatives were carefully and thoroughly reviewed with the patient. Ample time for discussion and questions allowed. The patient understood, was satisfied, and agreed to proceed. #3.  Would hold metformin the day of the procedure #4. Ongoing general care with Dr. Damita Dunnings

## 2014-02-28 ENCOUNTER — Encounter: Payer: Self-pay | Admitting: Internal Medicine

## 2014-03-02 ENCOUNTER — Ambulatory Visit (INDEPENDENT_AMBULATORY_CARE_PROVIDER_SITE_OTHER)
Admission: RE | Admit: 2014-03-02 | Discharge: 2014-03-02 | Disposition: A | Discharge status: Home / Self Care | Payer: BC Managed Care – PPO | Source: Ambulatory Visit | Attending: Internal Medicine | Admitting: Internal Medicine

## 2014-03-02 DIAGNOSIS — R109 Unspecified abdominal pain: Secondary | ICD-10-CM | POA: Diagnosis not present

## 2014-03-02 DIAGNOSIS — K429 Umbilical hernia without obstruction or gangrene: Secondary | ICD-10-CM | POA: Diagnosis not present

## 2014-03-02 MED ORDER — IOHEXOL 300 MG/ML  SOLN
100.0000 mL | Freq: Once | INTRAMUSCULAR | Status: AC | PRN
  Administered 2014-03-02: 100 mL via INTRAVENOUS

## 2014-03-15 ENCOUNTER — Telehealth: Payer: Self-pay | Admitting: Internal Medicine

## 2014-03-15 NOTE — Telephone Encounter (Signed)
Yes pt will need another previsit for updated instructions and to sign updated consents. Please schedule pt for previsit.

## 2014-03-21 ENCOUNTER — Encounter: Payer: Self-pay | Admitting: Internal Medicine

## 2014-03-21 ENCOUNTER — Encounter: Payer: BC Managed Care – PPO | Admitting: Internal Medicine

## 2014-03-21 NOTE — Telephone Encounter (Signed)
Left message for pt. Previsit scheduled for  Friday 06-02-14 at [place] to call back if this is not a good day or time.

## 2014-03-30 ENCOUNTER — Other Ambulatory Visit: Payer: Self-pay | Admitting: Family Medicine

## 2014-03-30 ENCOUNTER — Other Ambulatory Visit (INDEPENDENT_AMBULATORY_CARE_PROVIDER_SITE_OTHER): Payer: BC Managed Care – PPO

## 2014-03-30 DIAGNOSIS — E119 Type 2 diabetes mellitus without complications: Secondary | ICD-10-CM

## 2014-03-30 DIAGNOSIS — E1365 Other specified diabetes mellitus with hyperglycemia: Secondary | ICD-10-CM

## 2014-03-30 LAB — LIPID PANEL
Cholesterol: 200 mg/dL (ref 0–200)
HDL: 40.1 mg/dL (ref >39.00)
LDL Cholesterol: 132 mg/dL — ABNORMAL HIGH (ref 0–99)
NonHDL: 159.9
TRIGLYCERIDES: 140 mg/dL (ref 0.0–149.0)
Total CHOL/HDL Ratio: 5
VLDL: 28 mg/dL (ref 0.0–40.0)

## 2014-03-30 LAB — HEMOGLOBIN A1C: Hgb A1c MFr Bld: 6.9 % — ABNORMAL HIGH (ref 4.6–6.5)

## 2014-03-31 LAB — MICROALBUMIN / CREATININE URINE RATIO
CREATININE, URINE: 237 mg/dL
Microalb Creat Ratio: 13.3 mg/g (ref 0.0–30.0)
Microalb, Ur: 3.15 mg/dL — ABNORMAL HIGH (ref 0.00–1.89)

## 2014-04-02 ENCOUNTER — Encounter: Payer: Self-pay | Admitting: Family Medicine

## 2014-04-13 ENCOUNTER — Ambulatory Visit (INDEPENDENT_AMBULATORY_CARE_PROVIDER_SITE_OTHER): Payer: BC Managed Care – PPO | Admitting: Family Medicine

## 2014-04-13 ENCOUNTER — Encounter: Payer: Self-pay | Admitting: Family Medicine

## 2014-04-13 VITALS — BP 114/76 | HR 80 | Temp 98.3°F | Wt 225.8 lb

## 2014-04-13 DIAGNOSIS — R21 Rash and other nonspecific skin eruption: Secondary | ICD-10-CM

## 2014-04-13 DIAGNOSIS — H6121 Impacted cerumen, right ear: Secondary | ICD-10-CM

## 2014-04-13 DIAGNOSIS — E119 Type 2 diabetes mellitus without complications: Secondary | ICD-10-CM

## 2014-04-13 DIAGNOSIS — H6120 Impacted cerumen, unspecified ear: Secondary | ICD-10-CM | POA: Diagnosis not present

## 2014-04-13 MED ORDER — FLUOCINONIDE 0.05 % EX CREA: 1.0000 "application " | TOPICAL_CREAM | Freq: Two times a day (BID) | CUTANEOUS | Status: DC

## 2014-04-13 MED ORDER — LISINOPRIL 2.5 MG PO TABS: 2.5000 mg | ORAL_TABLET | Freq: Every day | ORAL | Status: DC

## 2014-04-13 NOTE — Progress Notes (Signed)
Pre visit review using our clinic review tool, if applicable. No additional management support is needed unless otherwise documented below in the visit note.  Continues to have irritation on the chest wall from drool.  Still with episodic axillary irritation, worse with deodorants. Tried mult types.  Used betamethasone with some relief.  Asking about options.   DM2.  A1c controlled.  Labs d/w pt. LDL not at goal and MALB up slightly.  D/w pt. MALB likely more important. No HTN.  Not on ACE yet.  Due for eye exam, discussed.    R ear feels clogged.  PMH and SH reviewed  ROS: See HPI, otherwise noncontributory.  Meds, vitals, and allergies reviewed.   nad ncat R ear canal clogged with wax, partially removed with curette and irrigation, hearing improved per patient report.  No complications. Drooling, at baseline Neck supple.  rrr ctab Blanching irritated rash on the chest and B axilla.  abd soft Ext w/o edema

## 2014-04-13 NOTE — Patient Instructions (Signed)
Use debrox and warm water in your right ear.   Start taking lisinopril and use the new cream on your armpit and chest.  If it doesn't help a lot and if the rash doesn't resolve, then stop using it.  Take care.

## 2014-04-14 ENCOUNTER — Encounter: Payer: Self-pay | Admitting: Family Medicine

## 2014-04-14 DIAGNOSIS — H6120 Impacted cerumen, unspecified ear: Secondary | ICD-10-CM | POA: Insufficient documentation

## 2014-04-14 NOTE — Assessment & Plan Note (Signed)
Partially resolved, he'll use debrox and irritation in the shower at home.  He agrees.

## 2014-04-14 NOTE — Assessment & Plan Note (Addendum)
Add on ACE, lisinopril 2.5mg  a day.  D/w pt.  We can work on LDL later, depending on his course. D/w pt.  He agrees. >25 minutes spent in face to face time with patient, >50% spent in counselling or coordination of care.  PNA vaccine encouraged.

## 2014-04-14 NOTE — Assessment & Plan Note (Signed)
Change to lidex with cautions, if not improved, then stop med.  He agrees.

## 2014-04-19 DIAGNOSIS — G40209 Localization-related (focal) (partial) symptomatic epilepsy and epileptic syndromes with complex partial seizures, not intractable, without status epilepticus: Secondary | ICD-10-CM | POA: Diagnosis not present

## 2014-06-07 ENCOUNTER — Ambulatory Visit (AMBULATORY_SURGERY_CENTER): Payer: BC Managed Care – PPO

## 2014-06-07 VITALS — Ht 66.0 in | Wt 226.0 lb

## 2014-06-07 DIAGNOSIS — R109 Unspecified abdominal pain: Secondary | ICD-10-CM

## 2014-06-07 NOTE — Progress Notes (Signed)
No allergies to eggs or soy No home oxygen No past problems with anesthesia No diet/weigth loss meds  Has email  Emmi instructions given for colonoscopy

## 2014-06-16 ENCOUNTER — Encounter: Payer: Self-pay | Admitting: Internal Medicine

## 2014-06-16 ENCOUNTER — Telehealth: Payer: Self-pay

## 2014-06-16 ENCOUNTER — Ambulatory Visit (AMBULATORY_SURGERY_CENTER): Payer: BC Managed Care – PPO | Admitting: Internal Medicine

## 2014-06-16 VITALS — BP 157/73 | HR 81 | Temp 96.5°F | Resp 24 | Ht 66.0 in | Wt 226.0 lb

## 2014-06-16 DIAGNOSIS — Z8673 Personal history of transient ischemic attack (TIA), and cerebral infarction without residual deficits: Secondary | ICD-10-CM | POA: Diagnosis not present

## 2014-06-16 DIAGNOSIS — R1032 Left lower quadrant pain: Secondary | ICD-10-CM

## 2014-06-16 DIAGNOSIS — E119 Type 2 diabetes mellitus without complications: Secondary | ICD-10-CM | POA: Diagnosis not present

## 2014-06-16 DIAGNOSIS — K625 Hemorrhage of anus and rectum: Secondary | ICD-10-CM

## 2014-06-16 DIAGNOSIS — R1030 Lower abdominal pain, unspecified: Secondary | ICD-10-CM | POA: Diagnosis not present

## 2014-06-16 DIAGNOSIS — D122 Benign neoplasm of ascending colon: Secondary | ICD-10-CM

## 2014-06-16 DIAGNOSIS — D120 Benign neoplasm of cecum: Secondary | ICD-10-CM

## 2014-06-16 DIAGNOSIS — D125 Benign neoplasm of sigmoid colon: Secondary | ICD-10-CM | POA: Diagnosis not present

## 2014-06-16 DIAGNOSIS — K635 Polyp of colon: Secondary | ICD-10-CM

## 2014-06-16 DIAGNOSIS — R109 Unspecified abdominal pain: Secondary | ICD-10-CM | POA: Diagnosis not present

## 2014-06-16 MED ORDER — SODIUM CHLORIDE 0.9 % IV SOLN: 500.0000 mL | INTRAVENOUS | Status: DC

## 2014-06-16 NOTE — Telephone Encounter (Signed)
Lm on vm 

## 2014-06-16 NOTE — Patient Instructions (Signed)
YOU HAD AN ENDOSCOPIC PROCEDURE TODAY AT [facility]: Refer to the procedure report that was given to you for any specific questions about what was found during the examination.  If the procedure report does not answer your questions, please call your gastroenterologist to clarify.  If you requested that your care partner not be given the details of your procedure findings, then the procedure report has been included in a sealed envelope for you to review at your convenience later.  YOU SHOULD EXPECT: Some feelings of bloating in the abdomen. Passage of more gas than usual.  Walking can help get rid of the air that was put into your GI tract during the procedure and reduce the bloating. If you had a lower endoscopy (such as a colonoscopy or flexible sigmoidoscopy) you may notice spotting of blood in your stool or on the toilet paper. If you underwent a bowel prep for your procedure, then you may not have a normal bowel movement for a few days.  DIET: Your first meal following the procedure should be a light meal and then it is ok to progress to your normal diet.  A half-sandwich or bowl of soup is an example of a good first meal.  Heavy or fried foods are harder to digest and may make you feel nauseous or bloated.  Likewise meals heavy in dairy and vegetables can cause extra gas to form and this can also increase the bloating.  Drink plenty of fluids but you should avoid alcoholic beverages for 24 hours.  ACTIVITY: Your care partner should take you home directly after the procedure.  You should plan to take it easy, moving slowly for the rest of the day.  You can resume normal activity the day after the procedure however you should NOT DRIVE or use heavy machinery for 24 hours (because of the sedation medicines used during the test).    SYMPTOMS TO REPORT IMMEDIATELY: A gastroenterologist can be reached at any hour.  During normal business hours, 8:30 AM to 5:00 PM Monday through Friday,  call [phone].  After hours and on weekends, please call the GI answering service at [phone] who will take a message and have the physician on call contact you.   Following lower endoscopy (colonoscopy or flexible sigmoidoscopy):  Excessive amounts of blood in the stool  Significant tenderness or worsening of abdominal pains  Swelling of the abdomen that is new, acute  Fever of 100F or higher    FOLLOW UP: If any biopsies were taken you will be contacted by phone or by letter within the next 1-3 weeks.  Call your gastroenterologist if you have not heard about the biopsies in 3 weeks.  Our staff will call the home number listed on your records the next business day following your procedure to check on you and address any questions or concerns that you may have at that time regarding the information given to you following your procedure. This is a courtesy call and so if there is no answer at the home number and we have not heard from you through the emergency physician on call, we will assume that you have returned to your regular daily activities without incident.  Polyps, diverticulosis, high fiber diet information given.  Dr. Henrene Pastor will advise you about next colonoscopy after pathology is reviewed.  Dr. Blanch Media office will get you an appointment with a general surgeon for consult re: hernia.  SIGNATURES/CONFIDENTIALITY: You and/or your care partner have signed paperwork  which will be entered into your electronic medical record.  These signatures attest to the fact that that the information above on your After Visit Summary has been reviewed and is understood.  Full responsibility of the confidentiality of this discharge information lies with you and/or your care-partner.

## 2014-06-16 NOTE — Progress Notes (Signed)
A/ox3 pleased with MAC, report to April RN 

## 2014-06-16 NOTE — Op Note (Signed)
[facility]  [address] [place], [id number]   COLONOSCOPY PROCEDURE REPORT  PATIENT: Bradley Davila, Bradley Davila  MR#: [id number] BIRTHDATE: March 23, 1961 , 57  yrs. old GENDER: male ENDOSCOPIST: Eustace Quail, MD REFERRED AF:[id number] Duncan, M.D. PROCEDURE DATE:  06/16/2014 PROCEDURE:   Colonoscopy with snare polypectomy x 4 First Screening Colonoscopy - Avg.  risk and is 50 yrs.  old or older - No.  Prior Negative Screening - Now for repeat screening. N/A  History of Adenoma - Now for follow-up colonoscopy & has been > or = to 3 yrs.  N/A  Polyps Removed Today? Yes. ASA CLASS:   Class II INDICATIONS:abdominal pain in the lower left quadrant and rectal bleeding.   Negative CT; Inguinal hernias MEDICATIONS: Monitored anesthesia care and Propofol 300 mg IV  DESCRIPTION OF PROCEDURE:   After the risks benefits and alternatives of the procedure were thoroughly explained, informed consent was obtained.  The digital rectal exam revealed no abnormalities of the rectum.   The LB [id number]  endoscope was introduced through the anus and advanced to the cecum, which was identified by both the appendix and ileocecal valve. No adverse events experienced.   The quality of the prep was excellent, using MoviPrep  The instrument was then slowly withdrawn as the colon was fully examined.  COLON FINDINGS: Four polyps ranging between 3-73mm in size were found in the sigmoid colon, ascending colon, and cecum (2).  A polypectomy was performed with a cold snare.  The resection was complete, the polyp tissue was completely retrieved and sent to histology.   There was moderate diverticulosis noted in the sigmoid colon.   The examination was otherwise normal.  Retroflexed views revealed internal hemorrhoids. The time to cecum=1 minutes 23 seconds.  Withdrawal time=13 minutes 48 seconds.  The scope was withdrawn and the procedure completed. COMPLICATIONS: There were no immediate  complications.  ENDOSCOPIC IMPRESSION: 1.   Four polyps were found in the sigmoid colon, ascending colon, and cecum; polypectomy was performed with a cold snare 2.   Moderate diverticulosis was noted in the sigmoid colon 3.   The examination was otherwise normal  RECOMMENDATIONS: 1. Repeat colonoscopy in 5 years if polyp adenomatous; otherwise 10 years 2. General surgical referral "left lower quadrant discomfort, question related to inguinal hernia"  eSigned:  Eustace Quail, MD 06/16/2014 12:49 PM   cc: Elsie Stain, MD and The Patient

## 2014-06-16 NOTE — Progress Notes (Signed)
Called to room to assist during endoscopic procedure.  Patient ID and intended procedure confirmed with present staff. Received instructions for my participation in the procedure from the performing physician.  

## 2014-06-19 ENCOUNTER — Telehealth: Payer: Self-pay | Admitting: *Deleted

## 2014-06-19 NOTE — Telephone Encounter (Signed)
  Follow up Call-  Call back number 06/16/2014  Post procedure Call Back phone  # [phone]  Permission to leave phone message No     Patient questions:  Do you have a fever, pain , or abdominal swelling? No. Pain Score  0 *  Have you tolerated food without any problems? Yes.    Have you been able to return to your normal activities? Yes.    Do you have any questions about your discharge instructions: Diet   No. Medications  No. Follow up visit  No.  Do you have questions or concerns about your Care? No.  Actions: * If pain score is 4 or above: No action needed, pain <4.

## 2014-06-21 ENCOUNTER — Encounter: Payer: Self-pay | Admitting: Internal Medicine

## 2014-09-04 ENCOUNTER — Other Ambulatory Visit: Payer: Self-pay | Admitting: Family Medicine

## 2014-09-14 LAB — HM DIABETES EYE EXAM

## 2014-10-02 ENCOUNTER — Encounter: Payer: Self-pay | Admitting: Family Medicine

## 2014-10-02 ENCOUNTER — Ambulatory Visit (INDEPENDENT_AMBULATORY_CARE_PROVIDER_SITE_OTHER): Payer: Commercial Managed Care - HMO | Admitting: Family Medicine

## 2014-10-02 VITALS — BP 106/74 | HR 86 | Temp 98.5°F | Wt 235.2 lb

## 2014-10-02 DIAGNOSIS — E119 Type 2 diabetes mellitus without complications: Secondary | ICD-10-CM

## 2014-10-02 MED ORDER — LISINOPRIL 2.5 MG PO TABS: 2.5000 mg | ORAL_TABLET | Freq: Every day | ORAL | Status: DC

## 2014-10-02 MED ORDER — METFORMIN HCL 500 MG PO TABS: ORAL_TABLET | ORAL | Status: DC

## 2014-10-02 NOTE — Patient Instructions (Signed)
Go to the lab on the way out.  We'll contact you with your lab report. Don't change your meds for now.  Let's get together in about 5-6 months with labs ahead of time.  Take care.  Glad to see you.

## 2014-10-02 NOTE — Progress Notes (Signed)
Pre visit review using our clinic review tool, if applicable. No additional management support is needed unless otherwise documented below in the visit note.  Prev seen at [facility] for [place] d/o, note reviewed and no changes in meds made at that visit.  Compliant with meds.    Diabetes:  Using medications without difficulties:yes, 1 metformin in Am and 1-2 in Pm.  Hypoglycemic episodes:no Hyperglycemic episodes:no Feet problems:no Blood Sugars averaging: usually ~100 or lower in AM, 130-190 later in the day eye exam within last year: yes, 09/14/14.  Due for f/u labs.    Some itching in the B axilla and midaxillary line and the back of the neck, prn lidex helps, no new soaps or triggers known. Seems to be worse in winter.  He limits use of lidex.  D/w pt about steroid cautions.   PMH and SH reviewed  Meds, vitals, and allergies reviewed.   ROS: See HPI.  Otherwise negative.    GEN: nad, alert and oriented, speech at baseline HEENT: mucous membranes moist NECK: supple w/o LA CV: rrr. PULM: ctab, no inc wob ABD: soft, +bs EXT: no edema SKIN: no acute rash  Diabetic foot exam: Normal inspection No skin breakdown No calluses  Normal DP pulses Normal sensation to light touch and monofilament Nails normal

## 2014-10-03 ENCOUNTER — Encounter: Payer: Self-pay | Admitting: Family Medicine

## 2014-10-03 LAB — MICROALBUMIN / CREATININE URINE RATIO
Creatinine,U: 148.9 mg/dL
MICROALB UR: 3.9 mg/dL — AB (ref 0.0–1.9)
Microalb Creat Ratio: 2.6 mg/g (ref 0.0–30.0)

## 2014-10-03 LAB — HEMOGLOBIN A1C: HEMOGLOBIN A1C: 7.3 % — AB (ref 4.6–6.5)

## 2014-10-03 NOTE — Assessment & Plan Note (Signed)
Recheck labs today, will recheck lipids later on since he isn't fasting.  No change in meds today.  Diet d/w pt.  Vaccination d/w pt, declined.   See notes on labs.  Still okay to continue prn topical steroid for now.  Benefit>risk, no chronic skin changes from steroid noted.  >25 minutes spent in face to face time with patient, >50% spent in counselling or coordination of care.

## 2014-12-13 DIAGNOSIS — G40209 Localization-related (focal) (partial) symptomatic epilepsy and epileptic syndromes with complex partial seizures, not intractable, without status epilepticus: Secondary | ICD-10-CM | POA: Diagnosis not present

## 2015-03-30 ENCOUNTER — Encounter: Payer: Self-pay | Admitting: Family Medicine

## 2015-03-30 ENCOUNTER — Ambulatory Visit (INDEPENDENT_AMBULATORY_CARE_PROVIDER_SITE_OTHER): Payer: Commercial Managed Care - HMO | Admitting: Family Medicine

## 2015-03-30 VITALS — BP 124/68 | HR 96 | Temp 98.4°F | Wt 231.2 lb

## 2015-03-30 DIAGNOSIS — E1165 Type 2 diabetes mellitus with hyperglycemia: Secondary | ICD-10-CM | POA: Diagnosis not present

## 2015-03-30 DIAGNOSIS — M7041 Prepatellar bursitis, right knee: Secondary | ICD-10-CM | POA: Diagnosis not present

## 2015-03-30 DIAGNOSIS — M7651 Patellar tendinitis, right knee: Secondary | ICD-10-CM

## 2015-03-30 DIAGNOSIS — IMO0001 Reserved for inherently not codable concepts without codable children: Secondary | ICD-10-CM

## 2015-03-30 DIAGNOSIS — E119 Type 2 diabetes mellitus without complications: Secondary | ICD-10-CM

## 2015-03-30 LAB — HEMOGLOBIN A1C: Hgb A1c MFr Bld: 8 % — ABNORMAL HIGH (ref 4.6–6.5)

## 2015-03-30 NOTE — Patient Instructions (Signed)
Go to the lab on the way out.  We'll contact you with your lab report. Use a jumpers knee band and ice the area.   This should get better.  Take care.  Glad to see you.

## 2015-03-30 NOTE — Progress Notes (Addendum)
Pre visit review using our clinic review tool, if applicable. No additional management support is needed unless otherwise documented below in the visit note.  Dm2.  He quit kool aid but is drinking soda.  Variable sugars at home. occ up to 200s.  Due for A1c.  D/w pt.   ED noted.    R knee pain.  Better now.  Anterior inferior pain.  Burning pain.  Started about 1 month ago.  No rash.  No bruising.  Was slightly puffy prev.  Tried an old knee brace, hot and cold patches, w/o much relief.  Getting off his knee helped.  He felt an occ knee pop and then the knee pain improved.    PMH and SH reviewed  ROS: See HPI, otherwise noncontributory.  Meds, vitals, and allergies reviewed.   nad ncat Speech at baseline R knee with normal inspection, not bruised or puffy.   ACL MCL LCL feel solid.  No locking or clicking Normal ROM, no pain on patellar manipulation.  R quad ligament tender and sore with testing.

## 2015-04-02 ENCOUNTER — Encounter: Payer: Self-pay | Admitting: *Deleted

## 2015-04-02 ENCOUNTER — Encounter: Payer: Self-pay | Admitting: Family Medicine

## 2015-04-02 DIAGNOSIS — M7650 Patellar tendinitis, unspecified knee: Secondary | ICD-10-CM | POA: Insufficient documentation

## 2015-04-02 NOTE — Assessment & Plan Note (Signed)
Worsened.  A1c worse, needs work on diet and recheck in 3 months before a visit.  No change in meds at this point.  See notes on labs.

## 2015-04-02 NOTE — Assessment & Plan Note (Signed)
Get OTC knee strap and should improve.  D/w pt.  Can ice as needed.

## 2015-06-23 ENCOUNTER — Other Ambulatory Visit: Payer: Self-pay | Admitting: Family Medicine

## 2015-06-23 DIAGNOSIS — IMO0001 Reserved for inherently not codable concepts without codable children: Secondary | ICD-10-CM

## 2015-06-23 DIAGNOSIS — E1165 Type 2 diabetes mellitus with hyperglycemia: Principal | ICD-10-CM

## 2015-06-28 ENCOUNTER — Other Ambulatory Visit (INDEPENDENT_AMBULATORY_CARE_PROVIDER_SITE_OTHER): Payer: Commercial Managed Care - HMO

## 2015-06-28 DIAGNOSIS — IMO0001 Reserved for inherently not codable concepts without codable children: Secondary | ICD-10-CM

## 2015-06-28 DIAGNOSIS — E1165 Type 2 diabetes mellitus with hyperglycemia: Secondary | ICD-10-CM

## 2015-06-28 LAB — LIPID PANEL
CHOLESTEROL: 226 mg/dL — AB (ref 0–200)
HDL: 41.1 mg/dL (ref >39.00)
NONHDL: 185.36
TRIGLYCERIDES: 205 mg/dL — AB (ref 0.0–149.0)
Total CHOL/HDL Ratio: 6
VLDL: 41 mg/dL — ABNORMAL HIGH (ref 0.0–40.0)

## 2015-06-28 LAB — COMPREHENSIVE METABOLIC PANEL
ALK PHOS: 78 U/L (ref 39–117)
ALT: 30 U/L (ref 0–53)
AST: 18 U/L (ref 0–37)
Albumin: 4.1 g/dL (ref 3.5–5.2)
BILIRUBIN TOTAL: 0.3 mg/dL (ref 0.2–1.2)
BUN: 14 mg/dL (ref 6–23)
CO2: 30 mEq/L (ref 19–32)
CREATININE: 1.07 mg/dL (ref 0.40–1.50)
Calcium: 9.5 mg/dL (ref 8.4–10.5)
Chloride: 100 mEq/L (ref 96–112)
GFR: 76.08 mL/min (ref >60.00)
Glucose, Bld: 154 mg/dL — ABNORMAL HIGH (ref 70–99)
Potassium: 4.3 mEq/L (ref 3.5–5.1)
SODIUM: 138 meq/L (ref 135–145)
Total Protein: 7.8 g/dL (ref 6.0–8.3)

## 2015-06-28 LAB — LDL CHOLESTEROL, DIRECT: Direct LDL: 163 mg/dL

## 2015-06-28 LAB — HEMOGLOBIN A1C: Hgb A1c MFr Bld: 7.5 % — ABNORMAL HIGH (ref 4.6–6.5)

## 2015-06-29 ENCOUNTER — Other Ambulatory Visit: Payer: Commercial Managed Care - HMO

## 2015-07-03 ENCOUNTER — Ambulatory Visit: Payer: Commercial Managed Care - HMO | Admitting: Family Medicine

## 2015-07-05 ENCOUNTER — Ambulatory Visit (INDEPENDENT_AMBULATORY_CARE_PROVIDER_SITE_OTHER): Payer: Commercial Managed Care - HMO | Admitting: Family Medicine

## 2015-07-05 ENCOUNTER — Encounter: Payer: Self-pay | Admitting: Family Medicine

## 2015-07-05 VITALS — BP 108/60 | HR 83 | Temp 98.6°F | Wt 229.0 lb

## 2015-07-05 DIAGNOSIS — E1165 Type 2 diabetes mellitus with hyperglycemia: Secondary | ICD-10-CM

## 2015-07-05 DIAGNOSIS — IMO0001 Reserved for inherently not codable concepts without codable children: Secondary | ICD-10-CM

## 2015-07-05 DIAGNOSIS — E119 Type 2 diabetes mellitus without complications: Secondary | ICD-10-CM | POA: Diagnosis not present

## 2015-07-05 NOTE — Progress Notes (Signed)
Pre visit review using our clinic review tool, if applicable. No additional management support is needed unless otherwise documented below in the visit note.  He is going to see the neuro clinic again in 2 months.    Diabetes:  Using medications without difficulties:yes Hypoglycemic episodes: rarely, if prolonged fasting Hyperglycemic episodes: occ but improved from prev.  Feet problems: rare tingling but some more cramping.   Blood Sugars averaging: usually lower than prev this summer.   eye exam within last year: yes A1c some better with diet changes.  Not at goal but improved.   More water, less kool aid.   We agreed to recheck in a few months w/o med change.   Weight is down some.  D/w pt.   Flu shot declined.    We talked about possible statin use in the future.    Meds, vitals, and allergies reviewed.   ROS: See HPI.  Otherwise negative.    GEN: nad, alert and oriented, speech at baseline.   HEENT: mucous membranes moist NECK: supple w/o LA CV: rrr. PULM: ctab, no inc wob ABD: soft, +bs EXT: no edema SKIN: no acute rash

## 2015-07-05 NOTE — Patient Instructions (Signed)
Recheck in about 3-4 months, labs before the visit.  Sugar was some better, but not to goal yet.  Keep working on your [employer] and diet.   Take care.  Glad to see you.

## 2015-07-05 NOTE — Assessment & Plan Note (Signed)
A1c some better with diet changes.  Not at goal but improved.   More water, less kool aid recently.  We agreed to recheck in a few months w/o med change.   Weight is down some.  D/w pt.   Flu shot declined.   He agrees with plan.  He'll likely need statin added at some point, but the plan was to get A1c down first.  He agrees.  Labs d/w pt, esp re: chol.

## 2015-10-02 ENCOUNTER — Other Ambulatory Visit (INDEPENDENT_AMBULATORY_CARE_PROVIDER_SITE_OTHER): Payer: Commercial Managed Care - HMO

## 2015-10-02 DIAGNOSIS — E119 Type 2 diabetes mellitus without complications: Secondary | ICD-10-CM

## 2015-10-02 LAB — HEMOGLOBIN A1C: Hgb A1c MFr Bld: 8.1 % — ABNORMAL HIGH (ref 4.6–6.5)

## 2015-10-05 ENCOUNTER — Encounter: Payer: Self-pay | Admitting: Family Medicine

## 2015-10-05 ENCOUNTER — Ambulatory Visit: Payer: Commercial Managed Care - HMO | Admitting: Family Medicine

## 2015-10-05 ENCOUNTER — Ambulatory Visit (INDEPENDENT_AMBULATORY_CARE_PROVIDER_SITE_OTHER): Payer: Commercial Managed Care - HMO | Admitting: Family Medicine

## 2015-10-05 VITALS — BP 110/84 | HR 80 | Temp 98.4°F | Wt 229.8 lb

## 2015-10-05 DIAGNOSIS — Z119 Encounter for screening for infectious and parasitic diseases, unspecified: Secondary | ICD-10-CM

## 2015-10-05 DIAGNOSIS — E1165 Type 2 diabetes mellitus with hyperglycemia: Secondary | ICD-10-CM

## 2015-10-05 DIAGNOSIS — IMO0001 Reserved for inherently not codable concepts without codable children: Secondary | ICD-10-CM

## 2015-10-05 NOTE — Assessment & Plan Note (Signed)
Diet is "fair"  "it was the holidays" A1c up, d/w pt.   Limited exercise.   Recheck in about 3 months.  He'll work on diet and exercise in the meantime.  He agrees.  Okay for outpatient f/u.

## 2015-10-05 NOTE — Progress Notes (Signed)
Pre visit review using our clinic review tool, if applicable. No additional management support is needed unless otherwise documented below in the visit note.  Diabetes:  Using medications without difficulties:yes, on lower dose of metformin.  See allergy list.   Hypoglycemic episodes:no Hyperglycemic episodes:no Feet problems: tingling and itching when his sugar is higher.  Blood Sugars averaging:  Usually under 120 in the AM, higher later in the day.  eye exam within last year: f/u pending.   Diet is "fair"  "it was the holidays" A1c up, d/w pt.   Limited exercise.    HIV prev neg 2005 per patient report.  Pt opts in for HCV screening.  D/w pt re: routine screening.    Declined flu and PNA vaccine, encouraged both.   PMH and SH reviewed  Meds, vitals, and allergies reviewed.   ROS: See HPI.  Otherwise negative.    GEN: nad, alert and oriented, speech at baseline.   HEENT: mucous membranes moist NECK: supple w/o LA CV: rrr. PULM: ctab, no inc wob ABD: soft, +bs EXT: no edema SKIN: no acute rash  Diabetic foot exam: Normal inspection No skin breakdown No calluses  Normal DP pulses Dec sensation to light touch and monofilament on R foot at baseline- that is likely not from DM2.  Normal sensation on the L foot.  Nails normal

## 2015-10-05 NOTE — Patient Instructions (Signed)
Don't change your meds for now.  Recheck labs in about 3 months before a visit.  I'll await your eye appointment notes.  Keep work on your [employer], diet and exercise as tolerated.  Take care.

## 2016-01-24 ENCOUNTER — Other Ambulatory Visit: Payer: Self-pay | Admitting: Family Medicine

## 2016-05-09 ENCOUNTER — Other Ambulatory Visit: Payer: Self-pay | Admitting: *Deleted

## 2016-05-09 MED ORDER — GLUCOSE BLOOD VI STRP: ORAL_STRIP | 3 refills | Status: DC

## 2016-05-09 MED ORDER — TRUE METRIX LEVEL 1 LOW VI SOLN: 3 refills | Status: DC

## 2016-05-09 MED ORDER — TRUEPLUS LANCETS 33G MISC: 3 refills | Status: DC

## 2016-05-09 MED ORDER — BD SWAB SINGLE USE REGULAR PADS: MEDICATED_PAD | 3 refills | Status: DC

## 2016-05-09 MED ORDER — TRUE METRIX AIR GLUCOSE METER W/DEVICE KIT: 1.0000 | PACK | Freq: Every day | 0 refills | Status: DC

## 2016-05-21 ENCOUNTER — Other Ambulatory Visit: Payer: Self-pay | Admitting: Family Medicine

## 2016-05-22 NOTE — Telephone Encounter (Signed)
Patient advised to schedule OV prior to further refills.

## 2016-09-04 DIAGNOSIS — G40209 Localization-related (focal) (partial) symptomatic epilepsy and epileptic syndromes with complex partial seizures, not intractable, without status epilepticus: Secondary | ICD-10-CM | POA: Diagnosis not present

## 2016-09-04 DIAGNOSIS — F419 Anxiety disorder, unspecified: Secondary | ICD-10-CM | POA: Diagnosis not present

## 2016-11-14 ENCOUNTER — Telehealth: Payer: Self-pay | Admitting: Family Medicine

## 2016-11-14 NOTE — Telephone Encounter (Signed)
Spoke to pt. Would like me to call back after 5 to schedule AWV

## 2016-12-16 NOTE — Telephone Encounter (Signed)
Spoke to pt. He will have his wife call tomorrow to schedule cpe/awv.  Does not need to see Lesia.

## 2017-01-02 ENCOUNTER — Ambulatory Visit (INDEPENDENT_AMBULATORY_CARE_PROVIDER_SITE_OTHER): Payer: Medicare HMO | Admitting: Family Medicine

## 2017-01-02 ENCOUNTER — Encounter: Payer: Self-pay | Admitting: Family Medicine

## 2017-01-02 ENCOUNTER — Other Ambulatory Visit (INDEPENDENT_AMBULATORY_CARE_PROVIDER_SITE_OTHER): Payer: Medicare HMO

## 2017-01-02 VITALS — BP 128/82 | HR 87 | Temp 98.4°F | Wt 226.2 lb

## 2017-01-02 DIAGNOSIS — E119 Type 2 diabetes mellitus without complications: Secondary | ICD-10-CM

## 2017-01-02 DIAGNOSIS — M7651 Patellar tendinitis, right knee: Secondary | ICD-10-CM | POA: Diagnosis not present

## 2017-01-02 DIAGNOSIS — IMO0001 Reserved for inherently not codable concepts without codable children: Secondary | ICD-10-CM

## 2017-01-02 DIAGNOSIS — E1165 Type 2 diabetes mellitus with hyperglycemia: Secondary | ICD-10-CM

## 2017-01-02 DIAGNOSIS — E118 Type 2 diabetes mellitus with unspecified complications: Secondary | ICD-10-CM | POA: Diagnosis not present

## 2017-01-02 DIAGNOSIS — Z1159 Encounter for screening for other viral diseases: Secondary | ICD-10-CM

## 2017-01-02 DIAGNOSIS — G40309 Generalized idiopathic epilepsy and epileptic syndromes, not intractable, without status epilepticus: Secondary | ICD-10-CM

## 2017-01-02 DIAGNOSIS — IMO0002 Reserved for concepts with insufficient information to code with codable children: Secondary | ICD-10-CM

## 2017-01-02 DIAGNOSIS — G40909 Epilepsy, unspecified, not intractable, without status epilepticus: Secondary | ICD-10-CM

## 2017-01-02 LAB — HEMOGLOBIN A1C: Hgb A1c MFr Bld: 8.8 % — ABNORMAL HIGH (ref 4.6–6.5)

## 2017-01-02 MED ORDER — MELOXICAM 15 MG PO TABS: 15.0000 mg | ORAL_TABLET | ORAL | 3 refills | Status: DC | PRN

## 2017-01-02 MED ORDER — METFORMIN HCL 500 MG PO TABS: ORAL_TABLET | ORAL | 3 refills | Status: DC

## 2017-01-02 NOTE — Patient Instructions (Addendum)
Call about an eye exam when possible.   Recheck labs in about 3 months before a visit.  Take meloxicam with food.  Use an OTC knee sleeve.   Take care.  Glad to see you.

## 2017-01-02 NOTE — Progress Notes (Signed)
Diabetes:  Using medications without difficulties: he is taking max tolerated dose of metformin, decrease the dose as needed to limit diarrhea.  Hypoglycemic episodes: no Hyperglycemic episodes: occ elevations, up to 180, rarely over 200 Feet problems: some cramping, pain, tingling.   Blood Sugars averaging: see above eye exam within last year: d/w pt, due.   A1c up, d/w pt.   Diet d/w pt, variable adherent.  He is going to get more active and is considering weight watchers.  Agreed to recheck in 3 months.   He declined pneumonia vaccine.  R knee pain. He is taking meloxicam as needed for knee pain. Ongoing, sometimes worse than others. No trauma. No bruising.  Seizure disorder. Has been seen at [facility]. Would like to transfer closer. No new events. Compliant with medications. He was happy with his care at [facility] but it would be easier for him to get established with M.D. who was closer.  Discussed with patient.  PMH and SH reviewed  Meds, vitals, and allergies reviewed.   ROS: Per HPI unless specifically indicated in ROS section. No FCNAVD.    GEN: nad, alert and oriented, speech at baseline.  HEENT: mucous membranes moist NECK: supple w/o LA CV: rrr. PULM: ctab, no inc wob ABD: soft, +bs EXT: no edema SKIN: no acute rash Right knee with normal range of motion but some crepitus noted. Joint lines not tender to palpation but he has some discomfort with patellar manipulation medially and laterally. The patellar tendon is slightly tender. Not puffy otherwise. Not bruised.  Diabetic foot exam: Normal inspection No skin breakdown No calluses  Normal DP pulses Normal sensation to light touch but dec sens to monofilament prev Nails normal

## 2017-01-02 NOTE — Progress Notes (Signed)
Pre visit review using our clinic review tool, if applicable. No additional management support is needed unless otherwise documented below in the visit note. 

## 2017-01-04 DIAGNOSIS — Z1159 Encounter for screening for other viral diseases: Secondary | ICD-10-CM | POA: Insufficient documentation

## 2017-01-04 NOTE — Assessment & Plan Note (Signed)
Reasonable to continue meloxicam. Can ice as needed. Using the sleep. Update me if not better.

## 2017-01-04 NOTE — Assessment & Plan Note (Signed)
History of. No recent events. Compliant with medications. He wants to get set up a local doctor. Refer. No change in medications. Appreciate neurology input.

## 2017-01-04 NOTE — Assessment & Plan Note (Signed)
See notes on labs. 

## 2017-01-04 NOTE — Assessment & Plan Note (Signed)
A1c up, d/w pt.   Diet d/w pt, variable adherent.  He is going to get more active and is considering weight watchers.  Agreed to recheck in 3 months.   We'll adjust his medications at that point if he is not improved. He did want to start extra medications today.

## 2017-01-06 ENCOUNTER — Encounter: Payer: Self-pay | Admitting: *Deleted

## 2017-01-06 ENCOUNTER — Telehealth: Payer: Self-pay | Admitting: *Deleted

## 2017-01-06 LAB — HEPATITIS C ANTIBODY: HCV Ab: NEGATIVE

## 2017-01-06 NOTE — Telephone Encounter (Signed)
Received a faxed form from [employer] requesting clarification on Meloxicam Form is on your desk.

## 2017-01-07 NOTE — Telephone Encounter (Signed)
I will work on the hardcopy.  Thanks. 

## 2017-01-08 ENCOUNTER — Other Ambulatory Visit: Payer: Self-pay | Admitting: *Deleted

## 2017-01-11 ENCOUNTER — Other Ambulatory Visit: Payer: Self-pay | Admitting: Family Medicine

## 2017-01-11 MED ORDER — MELOXICAM 15 MG PO TABS: 15.0000 mg | ORAL_TABLET | ORAL | 3 refills | Status: DC | PRN

## 2017-01-13 NOTE — Telephone Encounter (Signed)
[facility] pharmacy left requesting cb with frequency of how often can take meloxicam. Ref # [id number].

## 2017-01-14 MED ORDER — MELOXICAM 15 MG PO TABS: 15.0000 mg | ORAL_TABLET | Freq: Every day | ORAL | 3 refills | Status: DC | PRN

## 2017-01-14 NOTE — Addendum Note (Signed)
Addended by: Tonia Ghent on: 01/14/2017 11:41 AM   Modules accepted: Orders

## 2017-01-14 NOTE — Telephone Encounter (Signed)
Resent.  Thanks.

## 2017-03-10 ENCOUNTER — Ambulatory Visit (INDEPENDENT_AMBULATORY_CARE_PROVIDER_SITE_OTHER): Payer: Medicare HMO | Admitting: Neurology

## 2017-03-10 ENCOUNTER — Encounter: Payer: Self-pay | Admitting: Neurology

## 2017-03-10 VITALS — BP 130/82 | HR 83 | Resp 20 | Ht 66.0 in | Wt 224.0 lb

## 2017-03-10 DIAGNOSIS — G40409 Other generalized epilepsy and epileptic syndromes, not intractable, without status epilepticus: Secondary | ICD-10-CM | POA: Diagnosis not present

## 2017-03-10 DIAGNOSIS — G808 Other cerebral palsy: Secondary | ICD-10-CM | POA: Insufficient documentation

## 2017-03-10 MED ORDER — LEVETIRACETAM 1000 MG PO TABS: ORAL_TABLET | ORAL | 3 refills | Status: DC

## 2017-03-10 MED ORDER — OXCARBAZEPINE 600 MG PO TABS: 600.0000 mg | ORAL_TABLET | Freq: Two times a day (BID) | ORAL | 3 refills | Status: DC

## 2017-03-10 NOTE — Progress Notes (Signed)
Provider:  Larey Seat, M D  Referring Provider: Tonia Ghent, MD Primary Care Physician:  Tonia Ghent, MD  Chief Complaint  Patient presents with  . New Patient (Initial Visit)    has seizures every 3 weeks, on keppra and trileptal    HPI:  Bradley Davila is a 56 y.o. male  Is seen here as a referral/ revisit  from Dr. Damita Dunnings for A transfer of care. I have seen Mr. 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 In addition he has diabetes and has developed depression, he underwent a cholecystectomy while still being my patient in 2007. In the meantime he had been followed by my former Environmental manager O'Donovan  at [facility], but would like to return to the general neurology practice here for reasons of proximity, as well as easier emergency care. Dr. Ginny Forth noted that the patient's seizure disorder has been very well controlled and has actually just refilled medications for the last years without any additional diagnostic studies being undertaken. For this reason I will continue his current medications.   I will try to obtain his paper records form years ago- if they still exist.   Review of Systems: Out of a complete 14 system review, the patient complains of only the following symptoms, and all other reviewed systems are negative.  Last seizure witnessed 07/08/2013.   Social History   Social History  . Marital status: Married    Spouse name: Clarene Critchley  . Number of children: 1  . Years of education: N/A   Occupational History  .  Not Employed   Social History Main Topics  . Smoking status: Former Research scientist (life sciences)  . Smokeless tobacco: Never Used  . Alcohol use No  . Drug use: No  . Sexual activity: Not on file   Other Topics Concern  . Not on file   Social History Narrative   Married  2005, 1 daughter   Disabled due to cva    Family History  Problem Relation Age of Onset  . Hydrocephalus Mother   . Liver cancer Father   . Alcohol abuse Father   . Alcohol abuse Sister   . Heart disease Brother   . Colon cancer Neg Hx   . Prostate cancer Neg Hx     Past Medical History:  Diagnosis Date  . 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 Diabetes mellitus without complication ([place])   . Dysphagia as late effect of stroke   . Headache(784.0)   . Hypertension   . Seizures ([place])    h/o grand mal and staring episodes, followed by [facility] neuro  . 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 Stroke [facility])    childhood    Past Surgical History:  Procedure Laterality Date  . CHOLECYSTECTOMY  2007  . CYSTECTOMY     on buttock, s/p removal    Current Outpatient Prescriptions  Medication Sig Dispense Refill  . Alcohol Swabs (B-D SINGLE USE SWABS REGULAR) PADS Use as instructed to test blood sugar once daily or as needed.  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 100 each 3  . Blood Glucose Calibration (TRUE METRIX LEVEL 1) Low SOLN Use as instructed to test glucometer.  Diagnosis:  E11.69  Non insulin dependent. 1 each 3  . Blood Glucose Monitoring Suppl (TRUE METRIX AIR GLUCOSE METER) w/Device KIT Use as instructed to check blood sugar once daily or as needed.Diagnosis: E11.65Non insulin dependent.    . Blood Glucose Monitoring Suppl (TRUE METRIX AIR GLUCOSE METER) w/Device KIT 1 kit by In Vitro route daily. Use as instructed to check blood sugar once daily or as needed.  Diagnosis: E11.65  Non insulin dependent. 1 kit 0  . EPINEPHrine 0.3 mg/0.3 mL IJ SOAJ injection Inject 0.3 mg into the muscle as needed.    . fluocinonide cream (LIDEX) 0.05 % Apply 1 application topically 2 (two) times daily. 30 g 1  . glucose blood (TRUE METRIX BLOOD GLUCOSE TEST) test strip Use as instructed to test blood sugar  once daily or as needed.  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 100 each 3  . levETIRAcetam (KEPPRA) 1000 MG tablet Take 1,000 mg by mouth. Take one in am and 1 1/2 in evening     . lisinopril (ZESTRIL) 2.5 MG tablet Take 1 tablet (2.5 mg total) by mouth daily. 90 tablet 3  . meloxicam (MOBIC) 15 MG tablet Take 1 tablet (15 mg total) by mouth daily as needed for pain (with food). 90 tablet 3  . metFORMIN (GLUCOPHAGE) 500 MG tablet TAKE 1 TABLET IN THE MORNING  AND TAKE 1 TO 2 TABLETS IN THE EVENING 270 tablet 3  . oxcarbazepine (TRILEPTAL) 600 MG tablet Take 600 mg by mouth 2 (two) times daily.      . TRUEPLUS LANCETS 33G MISC Use as instructed to test blood sugar once daily or as needed.  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 100 each 3   No current facility-administered medications for this visit.     Allergies as of 03/10/2017 - Review Complete 03/10/2017  Allergen Reaction Noted  . Benadryl [diphenhydramine hcl]  04/17/2011  . Yellow jacket venom [honey bee venom]  04/17/2011  . Carbamazepine  05/11/2011  . Lamotrigine  05/11/2011  . Metformin and related  10/17/2013  . Phenobarbital  05/11/2011    Vitals: BP 130/82   Pulse 83   Resp 20   Ht 5\' 6"  (1.676 m)   Wt 224 lb (101.6 kg)   BMI 36.15 kg/m  Last Weight:  Wt Readings from Last 1 Encounters:  03/10/17 224 lb (101.6 kg)   Last Height:   Ht Readings from Last 1 Encounters:  03/10/17 5\' 6"  (1.676 m)    Physical exam:  General: The patient is awake, alert and appears not in acute distress. The patient is cleanly dressed. . Head: Normocephalic, atraumatic. Neck is supple. Mallampati 4, neck circumference: 17.5 Cardiovascular:  Regular rate and rhythm , without  murmurs or carotid bruit, and without distended neck veins. Respiratory: Lungs are clear to auscultation. Skin:  Without evidence of edema, or rash Trunk: BMI is severely  elevated and patient  has a leaning  Posture. Right sided weakness.    Neurologic exam : The patient is awake and alert, oriented to place and time.  Memory subjective  described as intact. There is a normal attention span & concentration ability. Speech is fluent with dysarthria, dysphonia but not aphasia. Mood and affect are appropriate.  Cranial nerves: Pupils are equal and briskly reactive to light. Extraocular movements ; the patient's eyes do not track movement in a coordinated fashion, he does have some nystagmus horizontally little eye bobbing with gaze to the right, up toward  and downward gaze however seem to be coordinated and conjugate. Visual fields by finger perimetry are intact. Hearing to finger rub intact.  Facial sensation intact to fine touch. Facial motor weakness on the left  tongue and uvula move midline. Tongue protrusion into either cheek is devaited to the right . Shoulder shrug is normal.   Motor exam: Mr. Gwenlyn Found has a increased muscle tone over the hemiparetic right body side, which would be his dominant side.  Sensory:  Fine touch, pinprick and vibration were affected throughout the right body.   Coordination: Rapid alternating movements in the fingers/hands were normal. Finger-to-nose maneuver  normal without evidence of ataxia, dysmetria or tremor.  Gait and station: Patient walks without assistance.   Deep tendon reflexes: in the upper and lower extremities are spastic on the left.   Assessment:  After physical and neurologic examination, review of laboratory studies, imaging, neurophysiology testing and pre-existing records, assessment is that of :  1)Dysphonia, dysarthria and a tendency to drool affects the left facial side and  mild spasticity affects right body side.  2) Epilepsy well controlled on the current medication regimen which I will continue epilepsy is also thought to be a sequela of cerebral palsy. The patient takes oxcarbazepine 600 mg twice daily lisinopril for proteinuria / blood pressure 2.5 mg daily , metformin  1 tablet in the morning 2 tablets at night,  meloxicam 15 mg once a day for pain, only taken with food. Keppra generic levetiracetam 1000 mg 1 tablet in the morning and 1.5 tablets in the evening.  Plan:  Treatment plan and additional workup :  Keppra and Oxcarbazepine to be refilled.     Asencion Partridge Yazen Rosko MD 03/10/2017

## 2017-03-31 ENCOUNTER — Other Ambulatory Visit (INDEPENDENT_AMBULATORY_CARE_PROVIDER_SITE_OTHER): Payer: Medicare HMO

## 2017-03-31 DIAGNOSIS — E119 Type 2 diabetes mellitus without complications: Secondary | ICD-10-CM

## 2017-03-31 DIAGNOSIS — Z1159 Encounter for screening for other viral diseases: Secondary | ICD-10-CM

## 2017-03-31 LAB — COMPREHENSIVE METABOLIC PANEL
ALK PHOS: 62 U/L (ref 39–117)
ALT: 18 U/L (ref 0–53)
AST: 12 U/L (ref 0–37)
Albumin: 4 g/dL (ref 3.5–5.2)
BILIRUBIN TOTAL: 0.3 mg/dL (ref 0.2–1.2)
BUN: 17 mg/dL (ref 6–23)
CALCIUM: 9.2 mg/dL (ref 8.4–10.5)
CO2: 27 meq/L (ref 19–32)
Chloride: 100 mEq/L (ref 96–112)
Creatinine, Ser: 0.9 mg/dL (ref 0.40–1.50)
GFR: 92.31 mL/min (ref >60.00)
Glucose, Bld: 167 mg/dL — ABNORMAL HIGH (ref 70–99)
POTASSIUM: 4.2 meq/L (ref 3.5–5.1)
Sodium: 136 mEq/L (ref 135–145)
TOTAL PROTEIN: 7.2 g/dL (ref 6.0–8.3)

## 2017-03-31 LAB — LDL CHOLESTEROL, DIRECT: Direct LDL: 172 mg/dL

## 2017-03-31 LAB — LIPID PANEL
Cholesterol: 237 mg/dL — ABNORMAL HIGH (ref 0–200)
HDL: 40 mg/dL (ref >39.00)
NonHDL: 197.36
TRIGLYCERIDES: 313 mg/dL — AB (ref 0.0–149.0)
Total CHOL/HDL Ratio: 6
VLDL: 62.6 mg/dL — AB (ref 0.0–40.0)

## 2017-03-31 LAB — HEMOGLOBIN A1C: Hgb A1c MFr Bld: 7.9 % — ABNORMAL HIGH (ref 4.6–6.5)

## 2017-04-01 LAB — HEPATITIS C ANTIBODY: HCV Ab: NEGATIVE

## 2017-04-03 ENCOUNTER — Ambulatory Visit: Payer: Medicare HMO | Admitting: Family Medicine

## 2017-04-10 ENCOUNTER — Encounter: Payer: Self-pay | Admitting: Family Medicine

## 2017-04-10 ENCOUNTER — Ambulatory Visit (INDEPENDENT_AMBULATORY_CARE_PROVIDER_SITE_OTHER): Payer: Medicare HMO | Admitting: Family Medicine

## 2017-04-10 VITALS — BP 122/80 | HR 88 | Temp 98.5°F | Wt 223.5 lb

## 2017-04-10 DIAGNOSIS — H6121 Impacted cerumen, right ear: Secondary | ICD-10-CM | POA: Diagnosis not present

## 2017-04-10 DIAGNOSIS — L989 Disorder of the skin and subcutaneous tissue, unspecified: Secondary | ICD-10-CM

## 2017-04-10 DIAGNOSIS — E118 Type 2 diabetes mellitus with unspecified complications: Secondary | ICD-10-CM

## 2017-04-10 DIAGNOSIS — E1165 Type 2 diabetes mellitus with hyperglycemia: Secondary | ICD-10-CM

## 2017-04-10 DIAGNOSIS — Z9119 Patient's noncompliance with other medical treatment and regimen: Secondary | ICD-10-CM | POA: Diagnosis not present

## 2017-04-10 DIAGNOSIS — Z91199 Patient's noncompliance with other medical treatment and regimen due to unspecified reason: Secondary | ICD-10-CM

## 2017-04-10 DIAGNOSIS — L299 Pruritus, unspecified: Secondary | ICD-10-CM | POA: Diagnosis not present

## 2017-04-10 DIAGNOSIS — K429 Umbilical hernia without obstruction or gangrene: Secondary | ICD-10-CM | POA: Diagnosis not present

## 2017-04-10 DIAGNOSIS — IMO0002 Reserved for concepts with insufficient information to code with codable children: Secondary | ICD-10-CM

## 2017-04-10 NOTE — Patient Instructions (Addendum)
Irrigate your ear canals during a shower.  Bradley Davila will call about your referral. Take care.  Glad to see you.  Recheck in about 3 months labs ahead of time.

## 2017-04-10 NOTE — Progress Notes (Signed)
Diabetes:  Using medications without difficulties: yes Hypoglycemic episodes: no Hyperglycemic episodes: no Feet problems: some tingling and pain in the R foot, not acute, present most of the time.  No L foot sx.   Blood Sugars averaging: ~180 at baseline.  eye exam within last year: due, d/w pt.  Scheduled for September.    HLD.  Declined statin start, d/w pt.    Allergy sx, worse outside, presumed to be from grass.  Diffuse itching.  He also has variable R ear hearing changes, which may be ETD/allergy related/due to cerumen impaction.   He is skin lesion noted on the left thigh. His about 1 cm across. Not tender. It has been present for years.  PMH and SH reviewed.   Vital signs, Meds and allergies reviewed.  ROS: Per HPI unless specifically indicated in ROS section   GEN: nad, alert and oriented, speech at baseline. HEENT: mucous membranes moist- bilateral cerumen impaction noted. I was able to remove approximately 100% on the R, 75% L.  this was with irrigation and curette. He did not have any injury to the left ear canal but the ear canal itself was chapped likely from having wax pressed against the tissue for an extended period of time. He felt much better and it made sense to only remove a portion of the wax so that he could continue irrigation at home over the next few days. NECK: supple w/o LA CV: rrr. PULM: ctab, no inc wob ABD: soft, +bs EXT: no edema SKIN: no acute rash Umbilical hernia noted, soft.  1 cm uniform smooth bordered brown lesion noted on the left medial thigh. Not tender. No ulceration.

## 2017-04-12 DIAGNOSIS — K429 Umbilical hernia without obstruction or gangrene: Secondary | ICD-10-CM | POA: Insufficient documentation

## 2017-04-12 DIAGNOSIS — Z91199 Patient's noncompliance with other medical treatment and regimen due to unspecified reason: Secondary | ICD-10-CM | POA: Insufficient documentation

## 2017-04-12 DIAGNOSIS — Z9119 Patient's noncompliance with other medical treatment and regimen: Secondary | ICD-10-CM | POA: Insufficient documentation

## 2017-04-12 DIAGNOSIS — L299 Pruritus, unspecified: Secondary | ICD-10-CM | POA: Insufficient documentation

## 2017-04-12 MED ORDER — LORATADINE 5 MG/5ML PO SYRP: 2.0000 mg | ORAL_SOLUTION | Freq: Every day | ORAL | Status: DC | PRN

## 2017-04-12 NOTE — Assessment & Plan Note (Signed)
See above. See after visit summary.

## 2017-04-12 NOTE — Assessment & Plan Note (Signed)
Presumed to be from environmental allergy. Discussed with patient and wife about options. Reasonable to try a low dose of Claritin to see if he can tolerate it and to see if it helps. He has a pretty clear history in that he will have itching after exposure to cut grass when working in the yard. He had previous intolerance to Benadryl but may be able to tolerate Claritin.

## 2017-04-12 NOTE — Assessment & Plan Note (Signed)
Benign-appearing, observe. Discussed with patient. He agrees.

## 2017-04-12 NOTE — Assessment & Plan Note (Signed)
Nontender. Easily reduced. Refer to surgery.

## 2017-04-12 NOTE — Assessment & Plan Note (Addendum)
A1c improved. Continue current medications. He worked on diet in the meantime. Labs discussed with patient in general. Declined statin start. Recheck in a few months. He agrees. >25 minutes spent in face to face time with patient, >50% spent in counselling or coordination of care.

## 2017-04-12 NOTE — Assessment & Plan Note (Signed)
This was a pleasant conversation but for the record the patient has declined routine vaccination such as pneumonia vaccine on multiple occasions. For charting purposes, and for record-keeping, he is noncompliant in this aspect.

## 2017-05-27 ENCOUNTER — Other Ambulatory Visit: Payer: Self-pay | Admitting: Surgery

## 2017-05-27 DIAGNOSIS — K429 Umbilical hernia without obstruction or gangrene: Secondary | ICD-10-CM | POA: Diagnosis not present

## 2017-07-05 ENCOUNTER — Other Ambulatory Visit: Payer: Self-pay | Admitting: Family Medicine

## 2017-07-05 DIAGNOSIS — E118 Type 2 diabetes mellitus with unspecified complications: Principal | ICD-10-CM

## 2017-07-05 DIAGNOSIS — E1165 Type 2 diabetes mellitus with hyperglycemia: Secondary | ICD-10-CM

## 2017-07-05 DIAGNOSIS — IMO0002 Reserved for concepts with insufficient information to code with codable children: Secondary | ICD-10-CM

## 2017-07-10 ENCOUNTER — Other Ambulatory Visit (INDEPENDENT_AMBULATORY_CARE_PROVIDER_SITE_OTHER): Payer: Medicare HMO

## 2017-07-10 DIAGNOSIS — E1165 Type 2 diabetes mellitus with hyperglycemia: Secondary | ICD-10-CM

## 2017-07-10 DIAGNOSIS — E118 Type 2 diabetes mellitus with unspecified complications: Secondary | ICD-10-CM | POA: Diagnosis not present

## 2017-07-10 DIAGNOSIS — IMO0002 Reserved for concepts with insufficient information to code with codable children: Secondary | ICD-10-CM

## 2017-07-10 LAB — HEMOGLOBIN A1C: HEMOGLOBIN A1C: 8.6 % — AB (ref 4.6–6.5)

## 2017-07-13 ENCOUNTER — Other Ambulatory Visit: Payer: Self-pay

## 2017-07-13 NOTE — Telephone Encounter (Signed)
[employer] faxes r/x clarification on patient's meloxicam.   This writer returned fax to [phone] stating that Meloxicam 15mg  1 po qd prn pain (with food) #90, 3ref sent in on 01/14/17.  Unsure of what clarification is needed, all info is present.

## 2017-07-14 ENCOUNTER — Other Ambulatory Visit: Payer: Self-pay | Admitting: *Deleted

## 2017-07-14 ENCOUNTER — Ambulatory Visit (INDEPENDENT_AMBULATORY_CARE_PROVIDER_SITE_OTHER): Payer: Medicare HMO | Admitting: Family Medicine

## 2017-07-14 ENCOUNTER — Encounter: Payer: Self-pay | Admitting: Family Medicine

## 2017-07-14 DIAGNOSIS — E118 Type 2 diabetes mellitus with unspecified complications: Secondary | ICD-10-CM

## 2017-07-14 DIAGNOSIS — E1165 Type 2 diabetes mellitus with hyperglycemia: Secondary | ICD-10-CM

## 2017-07-14 DIAGNOSIS — IMO0002 Reserved for concepts with insufficient information to code with codable children: Secondary | ICD-10-CM

## 2017-07-14 MED ORDER — METFORMIN HCL ER (MOD) 1000 MG PO TB24: 1000.0000 mg | ORAL_TABLET | Freq: Every day | ORAL | 3 refills | Status: DC

## 2017-07-14 MED ORDER — GLUCOSE BLOOD VI STRP: ORAL_STRIP | 3 refills | Status: DC

## 2017-07-14 NOTE — Patient Instructions (Signed)
Try 1000 then 2000mg  of extended release metformin.  See what you can do with diet and exercise.  Recheck in about 3 months.  Take care.  Glad to see you.

## 2017-07-14 NOTE — Progress Notes (Signed)
Diabetes:  Using medications without difficulties: still with some diarrhea with metformin.  D/w about extended release formulation.   Hypoglycemic episodes:no sx Hyperglycemic episodes: no sx Feet problems: some tinging prev but not recently.   Blood Sugars averaging: not checked often eye exam within last year: had to cancel his scheduled OV.  He'll call about f/u.   D/w pt about diet and exercise.  Encouraged.   A1c d/w pt.   D/w pt about taste changes that may be med related and he'll f/u with neuro about that.   He has surgery pending, d/w pt esp about flu shot.  vaccination declined by patient.    Meds, vitals, and allergies reviewed.   ROS: Per HPI unless specifically indicated in ROS section   GEN: nad, alert and oriented, speech at baseline.   HEENT: mucous membranes moist NECK: supple w/o LA CV: rrr. PULM: ctab, no inc wob ABD: soft, +bs, abdominal hernia noted. Soft. EXT: no edema  Diabetic foot exam: Normal inspection No skin breakdown No calluses  Normal DP pulses Dec sensation to light touch and monofilament on L foot noted.  Nails normal

## 2017-07-15 NOTE — Assessment & Plan Note (Signed)
eye exam within last year: had to cancel his scheduled eye exam.  He'll call about f/u.   D/w pt about diet and exercise.  Encouraged.   A1c d/w pt.   D/w pt about taste changes that may be med related and he'll f/u with neuro about that.   He has surgery pending, d/w pt esp about flu shot.  vaccination declined by patient.   Reasonable to try extended-release metformin to see if he can tolerate that better. That along with more work on diet and exercise may improve his A1c. See orders. All discussed. All questions answered.  See AVS.

## 2017-07-20 ENCOUNTER — Other Ambulatory Visit: Payer: Self-pay | Admitting: Family Medicine

## 2017-07-20 ENCOUNTER — Telehealth: Payer: Self-pay | Admitting: *Deleted

## 2017-07-20 NOTE — Telephone Encounter (Signed)
Copied from [facility] #[id number]. Topic: Quick Communication - See Telephone Encounter >> Jul 20, 2017  4:16 PM Cecelia Byars, NT wrote: CRM for notification. See Telephone encounter for: [facility] needs [facility] sent [facility] [place] [facility] does not [facility] the metformin extended release ,[facility] 500 mcg  of the extended release   Please call wife back at [phone] 07/20/17.

## 2017-07-20 NOTE — Telephone Encounter (Signed)
See phone note from [facility] regarding medication and [facility].

## 2017-07-21 ENCOUNTER — Telehealth: Payer: Self-pay | Admitting: Family Medicine

## 2017-07-21 MED ORDER — METFORMIN HCL ER (MOD) 1000 MG PO TB24: 1000.0000 mg | ORAL_TABLET | Freq: Every day | ORAL | 3 refills | Status: DC

## 2017-07-21 NOTE — Telephone Encounter (Signed)
Pharmacist called to ask if the pt's Rx for Glumetza can be changed to Glucophage ER, due to difficulty in obtaining Glumetza, and of the high cost of this medication.  Advised will send message to Dr. Damita Dunnings.

## 2017-07-21 NOTE — Telephone Encounter (Signed)
Patient notified by telephone that script has been sent to the pharmacy per Dr. Duncan. 

## 2017-07-21 NOTE — Telephone Encounter (Signed)
[facility] [place].  Thanks.

## 2017-07-22 MED ORDER — METFORMIN HCL ER 500 MG PO TB24: 1000.0000 mg | ORAL_TABLET | Freq: Every day | ORAL | 0 refills | Status: DC

## 2017-07-22 MED ORDER — METFORMIN HCL ER (MOD) 1000 MG PO TB24: 1000.0000 mg | ORAL_TABLET | Freq: Every day | ORAL | 3 refills | Status: DC

## 2017-07-22 NOTE — Telephone Encounter (Signed)
Yes, sent. Thanks.  ?

## 2017-07-22 NOTE — Telephone Encounter (Signed)
Please, yes. Sent.  Thanks.

## 2017-07-22 NOTE — Telephone Encounter (Signed)
Spoke to pharmacist Greggory Stallion) at [facility] and was advised that patient's insurance will not cover the 1000 ER, but will cover the 500 ER. Can new script be sent over for the 500 ER with directions?

## 2017-08-03 ENCOUNTER — Encounter (HOSPITAL_COMMUNITY): Payer: Self-pay

## 2017-08-03 ENCOUNTER — Other Ambulatory Visit: Payer: Self-pay

## 2017-08-03 ENCOUNTER — Encounter (HOSPITAL_COMMUNITY)
Admission: RE | Admit: 2017-08-03 | Discharge: 2017-08-03 | Disposition: A | Discharge status: Home / Self Care | Payer: Medicare HMO | Source: Ambulatory Visit | Attending: Surgery | Admitting: Surgery

## 2017-08-03 DIAGNOSIS — Z811 Family history of alcohol abuse and dependence: Secondary | ICD-10-CM | POA: Diagnosis not present

## 2017-08-03 DIAGNOSIS — Z809 Family history of malignant neoplasm, unspecified: Secondary | ICD-10-CM | POA: Diagnosis not present

## 2017-08-03 DIAGNOSIS — I69321 Dysphasia following cerebral infarction: Secondary | ICD-10-CM | POA: Diagnosis not present

## 2017-08-03 DIAGNOSIS — F419 Anxiety disorder, unspecified: Secondary | ICD-10-CM | POA: Diagnosis not present

## 2017-08-03 DIAGNOSIS — Z79899 Other long term (current) drug therapy: Secondary | ICD-10-CM | POA: Diagnosis not present

## 2017-08-03 DIAGNOSIS — Z833 Family history of diabetes mellitus: Secondary | ICD-10-CM | POA: Diagnosis not present

## 2017-08-03 DIAGNOSIS — Z86010 Personal history of colonic polyps: Secondary | ICD-10-CM | POA: Diagnosis not present

## 2017-08-03 DIAGNOSIS — Z794 Long term (current) use of insulin: Secondary | ICD-10-CM | POA: Diagnosis not present

## 2017-08-03 DIAGNOSIS — E119 Type 2 diabetes mellitus without complications: Secondary | ICD-10-CM | POA: Diagnosis not present

## 2017-08-03 DIAGNOSIS — I739 Peripheral vascular disease, unspecified: Secondary | ICD-10-CM | POA: Diagnosis not present

## 2017-08-03 DIAGNOSIS — Z8261 Family history of arthritis: Secondary | ICD-10-CM | POA: Diagnosis not present

## 2017-08-03 DIAGNOSIS — I10 Essential (primary) hypertension: Secondary | ICD-10-CM | POA: Diagnosis not present

## 2017-08-03 DIAGNOSIS — G40909 Epilepsy, unspecified, not intractable, without status epilepticus: Secondary | ICD-10-CM | POA: Diagnosis not present

## 2017-08-03 DIAGNOSIS — Z820 Family history of epilepsy and other diseases of the nervous system: Secondary | ICD-10-CM | POA: Diagnosis not present

## 2017-08-03 DIAGNOSIS — K429 Umbilical hernia without obstruction or gangrene: Secondary | ICD-10-CM | POA: Diagnosis not present

## 2017-08-03 DIAGNOSIS — Z87891 Personal history of nicotine dependence: Secondary | ICD-10-CM | POA: Diagnosis not present

## 2017-08-03 DIAGNOSIS — Z8249 Family history of ischemic heart disease and other diseases of the circulatory system: Secondary | ICD-10-CM | POA: Diagnosis not present

## 2017-08-03 DIAGNOSIS — Z9049 Acquired absence of other specified parts of digestive tract: Secondary | ICD-10-CM | POA: Diagnosis not present

## 2017-08-03 DIAGNOSIS — Z818 Family history of other mental and behavioral disorders: Secondary | ICD-10-CM | POA: Diagnosis not present

## 2017-08-03 DIAGNOSIS — F329 Major depressive disorder, single episode, unspecified: Secondary | ICD-10-CM | POA: Diagnosis not present

## 2017-08-03 HISTORY — DX: Adverse effect of unspecified anesthetic, initial encounter: T41.45XA

## 2017-08-03 HISTORY — DX: Other complications of anesthesia, initial encounter: T88.59XA

## 2017-08-03 LAB — GLUCOSE, CAPILLARY: Glucose-Capillary: 228 mg/dL — ABNORMAL HIGH (ref 65–99)

## 2017-08-03 LAB — BASIC METABOLIC PANEL
Anion gap: 9 (ref 5–15)
BUN: 12 mg/dL (ref 6–20)
CHLORIDE: 100 mmol/L — AB (ref 101–111)
CO2: 25 mmol/L (ref 22–32)
Calcium: 9.1 mg/dL (ref 8.9–10.3)
Creatinine, Ser: 0.93 mg/dL (ref 0.61–1.24)
GFR calc Af Amer: >60 mL/min (ref >60)
GFR calc non Af Amer: >60 mL/min (ref >60)
GLUCOSE: 194 mg/dL — AB (ref 65–99)
POTASSIUM: 4.2 mmol/L (ref 3.5–5.1)
Sodium: 134 mmol/L — ABNORMAL LOW (ref 135–145)

## 2017-08-03 LAB — CBC
HEMATOCRIT: 44.8 % (ref 39.0–52.0)
HEMOGLOBIN: 15.3 g/dL (ref 13.0–17.0)
MCH: 29.5 pg (ref 26.0–34.0)
MCHC: 34.2 g/dL (ref 30.0–36.0)
MCV: 86.3 fL (ref 78.0–100.0)
Platelets: 334 10*3/uL (ref 150–400)
RBC: 5.19 MIL/uL (ref 4.22–5.81)
RDW: 12.7 % (ref 11.5–15.5)
WBC: 10.6 10*3/uL — ABNORMAL HIGH (ref 4.0–10.5)

## 2017-08-03 NOTE — Pre-Procedure Instructions (Signed)
Bradley Davila  08/03/2017      [place] ([facility]), [place] - [place] [address] [place] ([place]) [place] [address] Phone: [phone] Fax: [phone]  [facility] [place], [place] [address] Phone: [phone] Fax: [phone]  [id number] [place] [address] [place] [address] Phone: [phone] Fax: [phone]    Your procedure is scheduled on Wednesday November 21.  Report to [facility] Admitting at 6:30 A.M.  Call this number if you have problems the morning of surgery:  [phone]   Remember:  Do not eat food or drink liquids after midnight.  Take these medicines the morning of surgery with A SIP OF WATER:   levetiracetam (Keppra) Loratadine (claritin) oxcarbazepine (trileptal)  STOP taking any Aspirin products (unless otherwise instructed by your surgeon), meloxicam (mobic), Aleve, Naproxen, Ibuprofen, Motrin, Advil, Goody's, BC's, all herbal medications, fish oil, and all vitamins  DO NOT TAKE metformin (glucophage) the day of surgery     How to Manage Your Diabetes Before and After Surgery  Why is it important to control my blood sugar before and after surgery? . Improving blood sugar levels before and after surgery helps healing and can limit problems. . A way of improving blood sugar control is eating a healthy diet by: o  Eating less sugar and carbohydrates o  Increasing activity/exercise o  Talking with your doctor about reaching your blood sugar goals . High blood sugars (greater than 180 mg/dL) can raise your risk of infections and slow your recovery, so you will need to focus on controlling your diabetes during the weeks before surgery. . Make sure that the doctor who takes care of your diabetes knows about your planned surgery including the date and location.  How do I manage my  blood sugar before surgery? . Check your blood sugar at least 4 times a day, starting 2 days before surgery, to make sure that the level is not too high or low. o Check your blood sugar the morning of your surgery when you wake up and every 2 hours until you get to the Short Stay unit. . If your blood sugar is less than 70 mg/dL, you will need to treat for low blood sugar: o Do not take insulin. o Treat a low blood sugar (less than 70 mg/dL) with  cup of clear juice (cranberry or apple), 4 glucose tablets, OR glucose gel. Recheck blood sugar in 15 minutes after treatment (to make sure it is greater than 70 mg/dL). If your blood sugar is not greater than 70 mg/dL on recheck, call [phone] o  for further instructions. . Report your blood sugar to the short stay nurse when you get to Short Stay.  . If you are admitted to the hospital after surgery: o Your blood sugar will be checked by the staff and you will probably be given insulin after surgery (instead of oral diabetes medicines) to make sure you have good blood sugar levels. o The goal for blood sugar control after surgery is 80-180 mg/dL.             Do not wear jewelry, make-up or nail polish.  Do not wear lotions, powders, or perfumes, or deoderant.  Do not shave 48 hours prior to surgery.  Men may shave face and neck.  Do not bring valuables to the hospital.  [facility] is not responsible for any belongings or valuables.  Contacts, dentures or bridgework may not be worn into surgery.  Leave your suitcase in the car.  After surgery it may be brought to your room.  For patients admitted to the hospital, discharge time will be determined by your treatment team.  Patients discharged the day of surgery will not be allowed to drive home.   Special instructions:    [facility]- Preparing For Surgery  Before surgery, you can play an important role. Because skin is not sterile, your skin needs to be as free of germs as  possible. You can reduce the number of germs on your skin by washing with CHG (chlorahexidine gluconate) Soap before surgery.  CHG is an antiseptic cleaner which kills germs and bonds with the skin to continue killing germs even after washing.  Please do not use if you have an allergy to CHG or antibacterial soaps. If your skin becomes reddened/irritated stop using the CHG.  Do not shave (including legs and underarms) for at least 48 hours prior to first CHG shower. It is OK to shave your face.  Please follow these instructions carefully.   1. Shower the NIGHT BEFORE SURGERY and the MORNING OF SURGERY with CHG.   2. If you chose to wash your hair, wash your hair first as usual with your normal shampoo.  3. After you shampoo, rinse your hair and body thoroughly to remove the shampoo.  4. Use CHG as you would any other liquid soap. You can apply CHG directly to the skin and wash gently with a scrungie or a clean washcloth.   5. Apply the CHG Soap to your body ONLY FROM THE NECK DOWN.  Do not use on open wounds or open sores. Avoid contact with your eyes, ears, mouth and genitals (private parts). Wash Face and genitals (private parts)  with your normal soap.  6. Wash thoroughly, paying special attention to the area where your surgery will be performed.  7. Thoroughly rinse your body with warm water from the neck down.  8. DO NOT shower/wash with your normal soap after using and rinsing off the CHG Soap.  9. Pat yourself dry with a CLEAN TOWEL.  10. Wear CLEAN PAJAMAS to bed the night before surgery, wear comfortable clothes the morning of surgery  11. Place CLEAN SHEETS on your bed the night of your first shower and DO NOT SLEEP WITH PETS.    Day of Surgery: Do not apply any deodorants/lotions. Please wear clean clothes to the hospital/surgery center.      Please read over the following fact sheets that you were given. Coughing and Deep Breathing and Surgical Site Infection  Prevention

## 2017-08-03 NOTE — Progress Notes (Signed)
PCP - Elsie Stain Cardiologist - denies Neurologist- formerly Dr. Ginny Forth (see care everywhere), currently sees Velia Meyer with [facility]. Last seen in June. Pt reports "small" seizures every few weeks, no grand mal seizure since October 2014.   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 Pt with speech impediment.   EKG - 08/03/2017   Sleep Study - pt refused to do sleep study previously. Has not been diagnosed with OSA.    Fasting Blood Sugar - 160s, pt reports occasionally going above 200. Pt currently switched to extended release metformin and states he hasn't figure out which time of day is most beneficial for him to take this medicine yet. Currently takes one in the morning and one after lunch. Last A1c 8.6. Pt educated to continue checking CBG prior to surgery and notify PCP if over 200 consistently.   Chart forwarded to anesthesia d/t elevated A1c, history of seizures.   Patient denies shortness of breath, fever, cough and chest pain at PAT appointment  Patient verbalized understanding of instructions that were given to them at the PAT appointment. Patient was also instructed that they will need to review over the PAT instructions again at home before surgery.

## 2017-08-03 NOTE — Progress Notes (Signed)
   08/03/17 1528  OBSTRUCTIVE SLEEP APNEA  Have you ever been diagnosed with sleep apnea through a sleep study? No  Do you snore loudly (loud enough to be heard through closed doors)?  0  Do you often feel tired, fatigued, or sleepy during the daytime (such as falling asleep during driving or talking to someone)? 0  Has anyone observed you stop breathing during your sleep? 0  Do you have, or are you being treated for high blood pressure? 1  BMI more than 35 kg/m2? 1  Age > 50 (1-yes) 1  Neck circumference greater than:Male 16 inches or larger, Male 17inches or larger? 1  Male Gender (Yes=1) 1  Obstructive Sleep Apnea Score 5  Score 5 or greater  Results sent to PCP

## 2017-08-04 NOTE — Progress Notes (Signed)
Anesthesia Chart Review:  Pt is a 56 year old male scheduled for umbilical hernia repair, insertion of mesh on 08/05/2017 with Coralie Keens, MD  - PCP is Elsie Stain, MD - Neurologist is Larey Seat, MD who sees pt for seizures. Initial visit 03/10/17 (previous neurology care at [facility]; notes in care everywhere).   PMH includes:  CVA (at birth, has speech and language deficits, dysphagia), HTN (not currently on meds), DM, seizures. Former smoker. BMI 36  Medications include: Keppra, metformin  BP 134/84   Pulse 66   Temp 36.8 C   Resp 20   Ht 5\' 6"  (1.676 m)   Wt 223 lb 14.4 oz (101.6 kg)   SpO2 97%   BMI 36.14 kg/m   Preoperative labs reviewed.   - Glucose 194. HbA1c was 8.6 on 07/10/17  EKG 08/03/17: NSR. Nonspecific ST abnormality   If no changes, I anticipate pt can proceed with surgery as scheduled.   Willeen Cass, FNP-BC [facility]/Anesthesiology Phone: [phone] 08/04/2017 9:59 AM

## 2017-08-04 NOTE — H&P (Signed)
Bradley Davila  Location: [facility] Patient #: [id number] DOB: February 24, 1961 Married / Language: English / Race: White Male   History of Present Illness   The patient is a 56 year old male who presents with an umbilical hernia. This is a pleasant gentleman referred by Dr. Elsie Stain for evaluation of a symptomatic umbilical hernia. He has had a hernia for several years. It is getting larger and causing discomfort. The discomfort is moderate in intensity and worse when lifting. He has had no obstructive symptoms. He denies nausea and vomiting. 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 He is otherwise without complaints. He has had a previous laparoscopic cholecystectomy in 2007   Past Surgical History  Colon Polyp Removal - Colonoscopy  Gallbladder Surgery - Laparoscopic   Diagnostic Studies History  Colonoscopy  1-5 years ago  Allergies CarBAMazepine *ANTICONVULSANTS*  DiphenhydrAMINE HCl *ANTIHISTAMINES*  LamoTRIgine *ANTICONVULSANTS*  MetFORMIN HCl *ANTIDIABETICS*  PHENobarbital *HYPNOTICS/SEDATIVES/SLEEP DISORDER AGENTS*  Venomil Honey Bee Venom *ALLERGENIC EXTRACTS/BIOLOGICALS MISC*  Allergies Reconciled   Social History Alcohol use  Occasional alcohol use. Caffeine use  Carbonated beverages. No drug use  Tobacco use  Current some day smoker.  Family History  Alcohol Abuse  Father, Mother, Sister. Arthritis  Mother. Cancer  Brother, Father. Depression  Sister. Diabetes Mellitus  Mother. Heart Disease  Brother. Seizure disorder  Sister.  Other Problems  Anxiety Disorder  Cerebrovascular Accident  Depression  Diabetes Mellitus  Diverticulosis  Seizure Disorder  Umbilical Hernia Repair     Review of Systems   General Present- Fatigue. Not Present- Appetite Loss, Chills, Fever, Night Sweats, Weight  Gain and Weight Loss. Skin Present- Dryness. Not Present- Change in Wart/Mole, Hives, Jaundice, New Lesions, Non-Healing Wounds, Rash and Ulcer. HEENT Present- Hearing Loss, Ringing in the Ears and Wears glasses/contact lenses. Not Present- Earache, Hoarseness, Nose Bleed, Oral Ulcers, Seasonal Allergies, Sinus Pain, Sore Throat, Visual Disturbances and Yellow Eyes. Respiratory Present- Snoring. Not Present- Bloody sputum, Chronic Cough, Difficulty Breathing and Wheezing. Breast Not Present- Breast Mass, Breast Pain, Nipple Discharge and Skin Changes. Cardiovascular Present- Leg Cramps. Not Present- Chest Pain, Difficulty Breathing Lying Down, Palpitations, Rapid Heart Rate, Shortness of Breath and Swelling of Extremities. Gastrointestinal Present- Bloating and Change in Bowel Habits. Not Present- Abdominal Pain, Bloody Stool, Chronic diarrhea, Constipation, Difficulty Swallowing, Excessive gas, Gets full quickly at meals, Hemorrhoids, Indigestion, Nausea, Rectal Pain and Vomiting. Male Genitourinary Present- Frequency. Not Present- Blood in Urine, Change in Urinary Stream, Impotence, Nocturia, Painful Urination, Urgency and Urine Leakage. Musculoskeletal Present- Back Pain, Joint Pain and Joint Stiffness. Not Present- Muscle Pain, Muscle Weakness and Swelling of Extremities. Neurological Present- Seizures. Not Present- Decreased Memory, Fainting, Headaches, Numbness, Tingling, Tremor, Trouble walking and Weakness. Psychiatric Present- Anxiety. Not Present- Bipolar, Change in Sleep Pattern, Depression, Fearful and Frequent crying. Hematology Not Present- Blood Thinners, Easy Bruising, Excessive bleeding, Gland problems, HIV and Persistent Infections.  Vitals   Weight: 225.4 lb Height: 67in Body Surface Area: 2.13 m Body Mass Index: 35.3 kg/m  Temp.: 98.40F  Pulse: 88 (Regular)  P.OX: 98% (Room air) BP: 124/84 (Sitting, Left Arm, Standard)    Physical Exam  General Mental  Status-Alert. General Appearance-Consistent with stated age. Hydration-Well hydrated. Voice-Normal.  Head and Neck Head-normocephalic, atraumatic with no lesions or palpable masses. Trachea-midline.  Eye Eyeball - Bilateral-Extraocular movements intact. Sclera/Conjunctiva - Bilateral-No scleral icterus.  Chest  and Lung Exam Chest and lung exam reveals -quiet, even and easy respiratory effort with no use of accessory muscles and on auscultation, normal breath sounds, no adventitious sounds and normal vocal resonance. Inspection Chest Wall - Normal. Back - normal.  Cardiovascular Cardiovascular examination reveals -normal heart sounds, regular rate and rhythm with no murmurs and normal pedal pulses bilaterally.  Abdomen Inspection Skin - Scar - no surgical scars. Hernias - Umbilical hernia - Reducible. Note: He has a moderate-sized hernia at the umbilicus. It is slightly difficult to reduce but is reducible. Palpation/Percussion Palpation and Percussion of the abdomen reveal - Soft, Non Tender, No Rebound tenderness, No Rigidity (guarding) and No hepatosplenomegaly. Auscultation Auscultation of the abdomen reveals - Bowel sounds normal.  Neurologic Neurologic evaluation reveals -alert and oriented x 3 with no impairment of recent or remote memory. Mental Status-Normal. Note: He has a speech deficit   Musculoskeletal - Did not examine.    Assessment & Plan   UMBILICAL HERNIA (Q76.2)  Impression: This is a enlarging symptomatic umbilical hernia. Repair with mesh is recommended. I discussed this with the patient and his wife in detail. We discussed both the laparoscopic and open repairs with mesh. He wished to proceed with an open umbilical hernia repair with mesh. We discussed the risks which includes but is not limited to bleeding, infection, injury to surrounding structures, use of mesh, postoperative recovery, hernia recurrence, cardiopulmonary  issues, neurologic issues, etc. He understands and wished to proceed with surgery which will be scheduled

## 2017-08-05 ENCOUNTER — Observation Stay (HOSPITAL_COMMUNITY)
Admission: RE | Admit: 2017-08-05 | Discharge: 2017-08-06 | Disposition: A | Discharge status: Home / Self Care | Payer: Medicare HMO | Source: Ambulatory Visit | Attending: Surgery | Admitting: Surgery

## 2017-08-05 ENCOUNTER — Encounter (HOSPITAL_COMMUNITY): Payer: Self-pay | Admitting: *Deleted

## 2017-08-05 ENCOUNTER — Telehealth: Payer: Self-pay | Admitting: Family Medicine

## 2017-08-05 ENCOUNTER — Ambulatory Visit (HOSPITAL_COMMUNITY): Payer: Medicare HMO | Admitting: Certified Registered Nurse Anesthetist

## 2017-08-05 ENCOUNTER — Encounter (HOSPITAL_COMMUNITY)
Admission: RE | Disposition: A | Discharge status: Home / Self Care | Payer: Self-pay | Source: Ambulatory Visit | Attending: Surgery

## 2017-08-05 ENCOUNTER — Ambulatory Visit (HOSPITAL_COMMUNITY): Payer: Medicare HMO | Admitting: Emergency Medicine

## 2017-08-05 DIAGNOSIS — F419 Anxiety disorder, unspecified: Secondary | ICD-10-CM | POA: Diagnosis not present

## 2017-08-05 DIAGNOSIS — E119 Type 2 diabetes mellitus without complications: Secondary | ICD-10-CM | POA: Diagnosis not present

## 2017-08-05 DIAGNOSIS — K429 Umbilical hernia without obstruction or gangrene: Principal | ICD-10-CM | POA: Diagnosis present

## 2017-08-05 DIAGNOSIS — G40909 Epilepsy, unspecified, not intractable, without status epilepticus: Secondary | ICD-10-CM | POA: Insufficient documentation

## 2017-08-05 DIAGNOSIS — Z8249 Family history of ischemic heart disease and other diseases of the circulatory system: Secondary | ICD-10-CM | POA: Insufficient documentation

## 2017-08-05 DIAGNOSIS — Z9049 Acquired absence of other specified parts of digestive tract: Secondary | ICD-10-CM | POA: Diagnosis not present

## 2017-08-05 DIAGNOSIS — I69321 Dysphasia following cerebral infarction: Secondary | ICD-10-CM | POA: Diagnosis not present

## 2017-08-05 DIAGNOSIS — L989 Disorder of the skin and subcutaneous tissue, unspecified: Secondary | ICD-10-CM | POA: Diagnosis not present

## 2017-08-05 DIAGNOSIS — Z811 Family history of alcohol abuse and dependence: Secondary | ICD-10-CM | POA: Insufficient documentation

## 2017-08-05 DIAGNOSIS — Z809 Family history of malignant neoplasm, unspecified: Secondary | ICD-10-CM | POA: Insufficient documentation

## 2017-08-05 DIAGNOSIS — Z87891 Personal history of nicotine dependence: Secondary | ICD-10-CM | POA: Insufficient documentation

## 2017-08-05 DIAGNOSIS — Z86010 Personal history of colonic polyps: Secondary | ICD-10-CM | POA: Insufficient documentation

## 2017-08-05 DIAGNOSIS — Z818 Family history of other mental and behavioral disorders: Secondary | ICD-10-CM | POA: Insufficient documentation

## 2017-08-05 DIAGNOSIS — Z820 Family history of epilepsy and other diseases of the nervous system: Secondary | ICD-10-CM | POA: Insufficient documentation

## 2017-08-05 DIAGNOSIS — Z794 Long term (current) use of insulin: Secondary | ICD-10-CM | POA: Insufficient documentation

## 2017-08-05 DIAGNOSIS — Z8261 Family history of arthritis: Secondary | ICD-10-CM | POA: Insufficient documentation

## 2017-08-05 DIAGNOSIS — Z79899 Other long term (current) drug therapy: Secondary | ICD-10-CM | POA: Diagnosis not present

## 2017-08-05 DIAGNOSIS — I639 Cerebral infarction, unspecified: Secondary | ICD-10-CM | POA: Diagnosis not present

## 2017-08-05 DIAGNOSIS — Z833 Family history of diabetes mellitus: Secondary | ICD-10-CM | POA: Insufficient documentation

## 2017-08-05 DIAGNOSIS — F329 Major depressive disorder, single episode, unspecified: Secondary | ICD-10-CM | POA: Diagnosis not present

## 2017-08-05 DIAGNOSIS — I10 Essential (primary) hypertension: Secondary | ICD-10-CM | POA: Insufficient documentation

## 2017-08-05 DIAGNOSIS — I739 Peripheral vascular disease, unspecified: Secondary | ICD-10-CM | POA: Insufficient documentation

## 2017-08-05 HISTORY — PX: INSERTION OF MESH: SHX5868

## 2017-08-05 HISTORY — PX: UMBILICAL HERNIA REPAIR: SHX196

## 2017-08-05 LAB — GLUCOSE, CAPILLARY
GLUCOSE-CAPILLARY: 192 mg/dL — AB (ref 65–99)
Glucose-Capillary: 182 mg/dL — ABNORMAL HIGH (ref 65–99)
Glucose-Capillary: 209 mg/dL — ABNORMAL HIGH (ref 65–99)
Glucose-Capillary: 192 mg/dL — ABNORMAL HIGH (ref 65–99)

## 2017-08-05 SURGERY — REPAIR, HERNIA, UMBILICAL, ADULT
Anesthesia: General | Site: Abdomen

## 2017-08-05 MED ORDER — DEXAMETHASONE SODIUM PHOSPHATE 10 MG/ML IJ SOLN
INTRAMUSCULAR | Status: AC
  Filled 2017-08-05: qty 1

## 2017-08-05 MED ORDER — TRAMADOL HCL 50 MG PO TABS: 50.0000 mg | ORAL_TABLET | Freq: Four times a day (QID) | ORAL | Status: DC | PRN

## 2017-08-05 MED ORDER — ONDANSETRON HCL 4 MG/2ML IJ SOLN
INTRAMUSCULAR | Status: AC
  Filled 2017-08-05: qty 2

## 2017-08-05 MED ORDER — PHENYLEPHRINE 40 MCG/ML (10ML) SYRINGE FOR IV PUSH (FOR BLOOD PRESSURE SUPPORT)
PREFILLED_SYRINGE | INTRAVENOUS | Status: AC
  Filled 2017-08-05: qty 10

## 2017-08-05 MED ORDER — CHLORHEXIDINE GLUCONATE CLOTH 2 % EX PADS: 6.0000 | MEDICATED_PAD | Freq: Once | CUTANEOUS | Status: DC

## 2017-08-05 MED ORDER — ENOXAPARIN SODIUM 40 MG/0.4ML ~~LOC~~ SOLN
40.0000 mg | SUBCUTANEOUS | Status: DC
  Administered 2017-08-06: 40 mg via SUBCUTANEOUS
  Filled 2017-08-05: qty 0.4

## 2017-08-05 MED ORDER — LEVETIRACETAM 500 MG PO TABS
1500.0000 mg | ORAL_TABLET | Freq: Every day | ORAL | Status: DC
  Administered 2017-08-05: 1500 mg via ORAL
  Filled 2017-08-05: qty 3

## 2017-08-05 MED ORDER — INSULIN ASPART 100 UNIT/ML ~~LOC~~ SOLN: 0.0000 [IU] | Freq: Every day | SUBCUTANEOUS | Status: DC

## 2017-08-05 MED ORDER — ONDANSETRON HCL 4 MG/2ML IJ SOLN
INTRAMUSCULAR | Status: DC | PRN
Start: 2017-08-05 — End: 2017-08-05
  Administered 2017-08-05: 4 mg via INTRAVENOUS

## 2017-08-05 MED ORDER — ROCURONIUM BROMIDE 100 MG/10ML IV SOLN
INTRAVENOUS | Status: DC | PRN
  Administered 2017-08-05: 30 mg via INTRAVENOUS

## 2017-08-05 MED ORDER — LIDOCAINE HCL (CARDIAC) 20 MG/ML IV SOLN
INTRAVENOUS | Status: DC | PRN
  Administered 2017-08-05: 100 mg via INTRAVENOUS

## 2017-08-05 MED ORDER — METHOCARBAMOL 500 MG PO TABS: 500.0000 mg | ORAL_TABLET | Freq: Four times a day (QID) | ORAL | Status: DC | PRN

## 2017-08-05 MED ORDER — OXYCODONE HCL 5 MG PO TABS: 5.0000 mg | ORAL_TABLET | Freq: Four times a day (QID) | ORAL | 0 refills | Status: DC | PRN

## 2017-08-05 MED ORDER — HYDROMORPHONE HCL 1 MG/ML IJ SOLN: 0.2500 mg | INTRAMUSCULAR | Status: DC | PRN

## 2017-08-05 MED ORDER — LIDOCAINE HCL (CARDIAC) 20 MG/ML IV SOLN: INTRAVENOUS | Status: DC | PRN

## 2017-08-05 MED ORDER — BUPIVACAINE-EPINEPHRINE 0.25% -1:200000 IJ SOLN
INTRAMUSCULAR | Status: AC
  Filled 2017-08-05: qty 1

## 2017-08-05 MED ORDER — DEXAMETHASONE SODIUM PHOSPHATE 10 MG/ML IJ SOLN
INTRAMUSCULAR | Status: DC | PRN
  Administered 2017-08-05: 5 mg via INTRAVENOUS

## 2017-08-05 MED ORDER — HYDROMORPHONE HCL 1 MG/ML IJ SOLN: 1.0000 mg | INTRAMUSCULAR | Status: DC | PRN

## 2017-08-05 MED ORDER — OXYCODONE HCL 5 MG PO TABS
5.0000 mg | ORAL_TABLET | ORAL | Status: DC | PRN
  Administered 2017-08-05: 10 mg via ORAL
  Administered 2017-08-05: 5 mg via ORAL
  Administered 2017-08-06: 10 mg via ORAL
  Filled 2017-08-05: qty 2
  Filled 2017-08-05: qty 1

## 2017-08-05 MED ORDER — OXCARBAZEPINE 300 MG PO TABS
600.0000 mg | ORAL_TABLET | Freq: Two times a day (BID) | ORAL | Status: DC
  Administered 2017-08-05 – 2017-08-06 (×2): 600 mg via ORAL
  Filled 2017-08-05 (×2): qty 2

## 2017-08-05 MED ORDER — CEFAZOLIN SODIUM-DEXTROSE 2-4 GM/100ML-% IV SOLN
INTRAVENOUS | Status: AC
  Filled 2017-08-05: qty 100

## 2017-08-05 MED ORDER — LEVETIRACETAM 500 MG PO TABS
1000.0000 mg | ORAL_TABLET | Freq: Every day | ORAL | Status: DC
  Administered 2017-08-06: 1000 mg via ORAL
  Filled 2017-08-05: qty 2

## 2017-08-05 MED ORDER — OXYCODONE HCL 5 MG PO TABS
ORAL_TABLET | ORAL | Status: AC
  Filled 2017-08-05: qty 2

## 2017-08-05 MED ORDER — PHENYLEPHRINE 40 MCG/ML (10ML) SYRINGE FOR IV PUSH (FOR BLOOD PRESSURE SUPPORT)
PREFILLED_SYRINGE | INTRAVENOUS | Status: DC | PRN
  Administered 2017-08-05: 120 ug via INTRAVENOUS
  Administered 2017-08-05 (×2): 80 ug via INTRAVENOUS

## 2017-08-05 MED ORDER — BUPIVACAINE-EPINEPHRINE (PF) 0.25% -1:200000 IJ SOLN
INTRAMUSCULAR | Status: AC
  Filled 2017-08-05: qty 30

## 2017-08-05 MED ORDER — SUCCINYLCHOLINE CHLORIDE 200 MG/10ML IV SOSY
PREFILLED_SYRINGE | INTRAVENOUS | Status: AC
  Filled 2017-08-05: qty 10

## 2017-08-05 MED ORDER — ROCURONIUM BROMIDE 10 MG/ML (PF) SYRINGE
PREFILLED_SYRINGE | INTRAVENOUS | Status: AC
  Filled 2017-08-05: qty 5

## 2017-08-05 MED ORDER — SODIUM CHLORIDE 0.9 % IV SOLN
INTRAVENOUS | Status: DC
Start: 2017-08-05 — End: 2017-08-06
  Administered 2017-08-05: 14:00:00 via INTRAVENOUS

## 2017-08-05 MED ORDER — 0.9 % SODIUM CHLORIDE (POUR BTL) OPTIME
TOPICAL | Status: DC | PRN
  Administered 2017-08-05: 1000 mL

## 2017-08-05 MED ORDER — FENTANYL CITRATE (PF) 250 MCG/5ML IJ SOLN
INTRAMUSCULAR | Status: AC
  Filled 2017-08-05: qty 5

## 2017-08-05 MED ORDER — CEFAZOLIN SODIUM-DEXTROSE 2-4 GM/100ML-% IV SOLN
2.0000 g | INTRAVENOUS | Status: AC
  Administered 2017-08-05: 2 g via INTRAVENOUS

## 2017-08-05 MED ORDER — SUGAMMADEX SODIUM 200 MG/2ML IV SOLN
INTRAVENOUS | Status: DC | PRN
  Administered 2017-08-05: 200 mg via INTRAVENOUS

## 2017-08-05 MED ORDER — ONDANSETRON HCL 4 MG/2ML IJ SOLN: 4.0000 mg | Freq: Four times a day (QID) | INTRAMUSCULAR | Status: DC | PRN

## 2017-08-05 MED ORDER — PROPOFOL 10 MG/ML IV BOLUS
INTRAVENOUS | Status: DC | PRN
  Administered 2017-08-05: 200 mg via INTRAVENOUS

## 2017-08-05 MED ORDER — LIDOCAINE 2% (20 MG/ML) 5 ML SYRINGE
INTRAMUSCULAR | Status: AC
  Filled 2017-08-05: qty 5

## 2017-08-05 MED ORDER — LACTATED RINGERS IV SOLN
INTRAVENOUS | Status: DC | PRN
  Administered 2017-08-05: 08:00:00 via INTRAVENOUS

## 2017-08-05 MED ORDER — INSULIN ASPART 100 UNIT/ML ~~LOC~~ SOLN
0.0000 [IU] | Freq: Three times a day (TID) | SUBCUTANEOUS | Status: DC
  Administered 2017-08-05: 5 [IU] via SUBCUTANEOUS
  Administered 2017-08-06: 3 [IU] via SUBCUTANEOUS

## 2017-08-05 MED ORDER — ONDANSETRON 4 MG PO TBDP: 4.0000 mg | ORAL_TABLET | Freq: Four times a day (QID) | ORAL | Status: DC | PRN

## 2017-08-05 MED ORDER — SUGAMMADEX SODIUM 200 MG/2ML IV SOLN
INTRAVENOUS | Status: AC
  Filled 2017-08-05: qty 2

## 2017-08-05 MED ORDER — FENTANYL CITRATE (PF) 100 MCG/2ML IJ SOLN
INTRAMUSCULAR | Status: DC | PRN
  Administered 2017-08-05: 100 ug via INTRAVENOUS

## 2017-08-05 MED ORDER — BUPIVACAINE-EPINEPHRINE 0.25% -1:200000 IJ SOLN
INTRAMUSCULAR | Status: DC | PRN
  Administered 2017-08-05: 20 mL

## 2017-08-05 MED ORDER — SUCCINYLCHOLINE CHLORIDE 200 MG/10ML IV SOSY
PREFILLED_SYRINGE | INTRAVENOUS | Status: DC | PRN
  Administered 2017-08-05: 140 mg via INTRAVENOUS

## 2017-08-05 MED ORDER — KETOROLAC TROMETHAMINE 30 MG/ML IJ SOLN
INTRAMUSCULAR | Status: DC | PRN
  Administered 2017-08-05: 30 mg via INTRAVENOUS

## 2017-08-05 MED ORDER — PROPOFOL 10 MG/ML IV BOLUS
INTRAVENOUS | Status: AC
  Filled 2017-08-05: qty 40

## 2017-08-05 MED ORDER — MIDAZOLAM HCL 2 MG/2ML IJ SOLN
INTRAMUSCULAR | Status: AC
  Filled 2017-08-05: qty 2

## 2017-08-05 SURGICAL SUPPLY — 46 items
ADH SKN CLS APL DERMABOND .7 (GAUZE/BANDAGES/DRESSINGS) ×1
BLADE CLIPPER SURG (BLADE) ×2 IMPLANT
BLADE SURG 10 STRL SS (BLADE) ×3 IMPLANT
BLADE SURG 15 STRL LF DISP TIS (BLADE) ×1 IMPLANT
BLADE SURG 15 STRL SS (BLADE) ×3
CANISTER SUCT 3000ML PPV (MISCELLANEOUS) IMPLANT
CHLORAPREP W/TINT 26ML (MISCELLANEOUS) ×3 IMPLANT
COVER SURGICAL LIGHT HANDLE (MISCELLANEOUS) ×3 IMPLANT
DECANTER SPIKE VIAL GLASS SM (MISCELLANEOUS) ×3 IMPLANT
DERMABOND ADVANCED (GAUZE/BANDAGES/DRESSINGS) ×2
DERMABOND ADVANCED .7 DNX12 (GAUZE/BANDAGES/DRESSINGS) ×1 IMPLANT
DRAPE LAPAROTOMY 100X72 PEDS (DRAPES) ×3 IMPLANT
DRAPE UTILITY XL STRL (DRAPES) ×3 IMPLANT
ELECT CAUTERY BLADE 6.4 (BLADE) ×3 IMPLANT
ELECT REM PT RETURN 9FT ADLT (ELECTROSURGICAL) ×3
ELECTRODE REM PT RTRN 9FT ADLT (ELECTROSURGICAL) ×1 IMPLANT
GLOVE BIOGEL PI IND STRL 6 (GLOVE) IMPLANT
GLOVE BIOGEL PI IND STRL 8.5 (GLOVE) IMPLANT
GLOVE BIOGEL PI INDICATOR 6 (GLOVE) ×2
GLOVE BIOGEL PI INDICATOR 8.5 (GLOVE) ×2
GLOVE SURG SIGNA 7.5 PF LTX (GLOVE) ×3 IMPLANT
GLOVE SURG SS PI 6.0 STRL IVOR (GLOVE) ×2 IMPLANT
GOWN STRL REUS W/ TWL LRG LVL3 (GOWN DISPOSABLE) ×1 IMPLANT
GOWN STRL REUS W/ TWL XL LVL3 (GOWN DISPOSABLE) ×1 IMPLANT
GOWN STRL REUS W/TWL LRG LVL3 (GOWN DISPOSABLE) ×3
GOWN STRL REUS W/TWL XL LVL3 (GOWN DISPOSABLE) ×3
KIT BASIN OR (CUSTOM PROCEDURE TRAY) ×3 IMPLANT
KIT ROOM TURNOVER OR (KITS) ×3 IMPLANT
MESH VENTRALEX ST 2.5 CRC MED (Mesh General) ×2 IMPLANT
NDL HYPO 25GX1X1/2 BEV (NEEDLE) ×1 IMPLANT
NEEDLE HYPO 25GX1X1/2 BEV (NEEDLE) ×3 IMPLANT
NS IRRIG 1000ML POUR BTL (IV SOLUTION) ×3 IMPLANT
PACK SURGICAL SETUP 50X90 (CUSTOM PROCEDURE TRAY) ×3 IMPLANT
PAD ARMBOARD 7.5X6 YLW CONV (MISCELLANEOUS) ×3 IMPLANT
PENCIL BUTTON HOLSTER BLD 10FT (ELECTRODE) ×3 IMPLANT
SPONGE LAP 18X18 X RAY DECT (DISPOSABLE) ×3 IMPLANT
SUT MNCRL AB 4-0 PS2 18 (SUTURE) ×3 IMPLANT
SUT NOVA NAB DX-16 0-1 5-0 T12 (SUTURE) ×5 IMPLANT
SUT VIC AB 3-0 SH 27 (SUTURE) ×3
SUT VIC AB 3-0 SH 27X BRD (SUTURE) ×1 IMPLANT
SYR CONTROL 10ML LL (SYRINGE) ×3 IMPLANT
TOWEL OR 17X24 6PK STRL BLUE (TOWEL DISPOSABLE) ×3 IMPLANT
TOWEL OR 17X26 10 PK STRL BLUE (TOWEL DISPOSABLE) ×3 IMPLANT
TUBE CONNECTING 12'X1/4 (SUCTIONS)
TUBE CONNECTING 12X1/4 (SUCTIONS) IMPLANT
YANKAUER SUCT BULB TIP NO VENT (SUCTIONS) IMPLANT

## 2017-08-05 NOTE — Op Note (Signed)
UMBILICAL HERNIA REPAIR, INSERTION OF MESH  Procedure Note  Bradley Davila 08/05/2017   Pre-op Diagnosis: UMBILICAL HERNIA     Post-op Diagnosis: same  Procedure(s): UMBILICAL HERNIA REPAIR INSERTION OF MESH  Surgeon(s): Coralie Keens, MD  Anesthesia: General  Staff:  Circulator: Paulette Blanch, RN Scrub Person: Delrae Rend Circulator Assistant: Fuller Plan, RN  Estimated Blood Loss: Minimal               Findings: The patient was found to have approximately a 2-3 cm fascial defect above the umbilicus containing omentum.  The hernia defect was repaired with a 6.4 round ventral patch from Bard  Procedure: The patient was brought to the operating room and identified as the correct patient.  He was placed supine on the operating table and general anesthesia was induced.  His abdomen was then prepped and draped in the usual sterile fashion.  I made a small longitudinal incision just above the umbilicus over the palpable hernia.  I took this down to the hernia sac.  I excised the sac and was found to contain omentum.  I freed up all the omental attachments to the peritoneum circumferentially around the fascial opening with the cautery.  I then brought a 6.4 cm round Prolene ventral patch from Bard onto the field.  It was placed through the fascial opening and then pulled up against the peritoneal surface with the stay ties.  I said the mesh in place circumferentially with #1 Novafil sutures.  I think that the stay ties.  I then closed the fascia over the top of the mesh with several figure-of-eight #1 Novafil sutures.  Wide coverage of the fascial defect appeared to be achieved.  I then anesthetized the skin and fascia with Marcaine.  Hemostasis appeared to be achieved.  I then closed the subtenons tissue with interrupted 3-0 Vicryl sutures and closed the skin with a running 4-0 Monocryl.  Dermabond was then applied.  The patient tolerated the procedure well.  All the counts were  correct at the end of the procedure.  The patient was then extubated in the operating room and taken in a stable condition to the recovery room.          [name] A   Date: 08/05/2017  Time: 9:13 AM

## 2017-08-05 NOTE — Anesthesia Procedure Notes (Signed)
Procedure Name: Intubation Date/Time: 08/05/2017 8:33 AM Performed by: Candis Shine, CRNA Pre-anesthesia Checklist: Patient identified, Emergency Drugs available, Suction available and Patient being monitored Patient Re-evaluated:Patient Re-evaluated prior to induction Oxygen Delivery Method: Circle System Utilized Preoxygenation: Pre-oxygenation with 100% oxygen Induction Type: IV induction Laryngoscope Size: Glidescope and 4 Grade View: Grade I Tube type: Oral Tube size: 7.5 mm Number of attempts: 1 Airway Equipment and Method: Stylet Placement Confirmation: ETT inserted through vocal cords under direct vision,  positive ETCO2 and breath sounds checked- equal and bilateral Secured at: 22 cm Tube secured with: Tape Dental Injury: Teeth and Oropharynx as per pre-operative assessment  Difficulty Due To: Difficulty was unanticipated and Difficult Airway- due to limited oral opening Comments: Elective glidescope intubation d/t small mouth opening/poor dentition

## 2017-08-05 NOTE — Transfer of Care (Signed)
Immediate Anesthesia Transfer of Care Note  Patient: Bradley Davila  Procedure(s) Performed: UMBILICAL HERNIA REPAIR (N/A Abdomen) INSERTION OF MESH (N/A Abdomen)  Patient Location: PACU  Anesthesia Type:General  Level of Consciousness: awake, alert  and oriented  Airway & Oxygen Therapy: Patient Spontanous Breathing and Patient connected to nasal cannula oxygen  Post-op Assessment: Report given to RN and Post -op Vital signs reviewed and stable  Post vital signs: Reviewed and stable  Last Vitals:  Vitals:   08/05/17 0702 08/05/17 0920  BP: (!) 166/86 131/78  Pulse: 88 88  Resp: 18 20  Temp: 36.7 C 36.4 C  SpO2: 97% 94%    Last Pain:  Vitals:   08/05/17 0702  TempSrc: Oral  PainSc:       Patients Stated Pain Goal: 2 (69/62/[id number])  Complications: No apparent anesthesia complications

## 2017-08-05 NOTE — Anesthesia Preprocedure Evaluation (Signed)
Anesthesia Evaluation  Patient identified by MRN, date of birth, ID band Patient awake    Reviewed: Allergy & Precautions, NPO status , reviewed documented beta blocker date and time   Airway Mallampati: II       Dental   Pulmonary former smoker,    breath sounds clear to auscultation       Cardiovascular hypertension, + Peripheral Vascular Disease   Rhythm:Regular Rate:Normal     Neuro/Psych    GI/Hepatic   Endo/Other  diabetes  Renal/GU      Musculoskeletal   Abdominal   Peds  Hematology   Anesthesia Other Findings   Reproductive/Obstetrics                             Anesthesia Physical Anesthesia Plan  ASA: III  Anesthesia Plan: General   Post-op Pain Management:    Induction: Intravenous  PONV Risk Score and Plan: 2 and Treatment may vary due to age or medical condition, Ondansetron, Dexamethasone and Midazolam  Airway Management Planned: Oral ETT  Additional Equipment:   Intra-op Plan:   Post-operative Plan: Extubation in OR  Informed Consent: I have reviewed the patients History and Physical, chart, labs and discussed the procedure including the risks, benefits and alternatives for the proposed anesthesia with the patient or authorized representative who has indicated his/her understanding and acceptance.   Dental advisory given  Plan Discussed with: Anesthesiologist and CRNA  Anesthesia Plan Comments:         Anesthesia Quick Evaluation

## 2017-08-05 NOTE — Discharge Instructions (Signed)
[facility] _______[facility] ° °UMBILICAL OR INGUINAL HERNIA REPAIR: POST OP INSTRUCTIONS ° °Always review your discharge instruction sheet given to you by the facility where your surgery was performed. °IF YOU HAVE DISABILITY OR FAMILY LEAVE FORMS, YOU MUST BRING THEM TO THE OFFICE FOR PROCESSING.   °DO NOT GIVE THEM TO YOUR DOCTOR. ° °1. A  prescription for pain medication may be given to you upon discharge.  Take your pain medication as prescribed, if needed.  If narcotic pain medicine is not needed, then you may take acetaminophen (Tylenol) or ibuprofen (Advil) as needed. °2. Take your usually prescribed medications unless otherwise directed. °If you need a refill on your pain medication, please contact your pharmacy.  They will contact our office to request authorization. Prescriptions will not be filled after 5 pm or on week-ends. °3. You should follow a light diet the first 24 hours after arrival home, such as soup and crackers, etc.  Be sure to include lots of fluids daily.  Resume your normal diet the day after surgery. °4.Most patients will experience some swelling and bruising around the umbilicus or in the groin and scrotum.  Ice packs and reclining will help.  Swelling and bruising can take several days to resolve.  °6. It is common to experience some constipation if taking pain medication after surgery.  Increasing fluid intake and taking a stool softener (such as Colace) will usually help or prevent this problem from occurring.  A mild laxative (Milk of Magnesia or Miralax) should be taken according to package directions if there are no bowel movements after 48 hours. °7. Unless discharge instructions indicate otherwise, you may remove your bandages 24-48 hours after surgery, and you may shower at that time.  You may have steri-strips (small skin tapes) in place directly over the incision.  These strips should be left on the skin for 7-10 days.  If your surgeon used skin glue on the  incision, you may shower in 24 hours.  The glue will flake off over the next 2-3 weeks.  Any sutures or staples will be removed at the office during your follow-up visit. °8. ACTIVITIES:  You may resume regular (light) daily activities beginning the next day--such as daily self-care, walking, climbing stairs--gradually increasing activities as tolerated.  You may have sexual intercourse when it is comfortable.  Refrain from any heavy lifting or straining until approved by your doctor. ° °a.You may drive when you are no longer taking prescription pain medication, you can comfortably wear a seatbelt, and you can safely maneuver your car and apply brakes. °b.RETURN TO WORK:   °_____________________________________________ ° °9.You should see your doctor in the office for a follow-up appointment approximately 2-3 weeks after your surgery.  Make sure that you call for this appointment within a day or two after you arrive home to insure a convenient appointment time. °10.OTHER INSTRUCTIONS: _________________________ °   _____________________________________ ° °WHEN TO CALL YOUR DOCTOR: °1. Fever over 101.0 °2. Inability to urinate °3. Nausea and/or vomiting °4. Extreme swelling or bruising °5. Continued bleeding from incision. °6. Increased pain, redness, or drainage from the incision ° °The clinic staff is available to answer your questions during regular business hours.  Please don’t hesitate to call and ask to speak to one of the nurses for clinical concerns.  If you have a medical emergency, go to the nearest emergency room or call 911.  A surgeon from [facility] is always on call at the hospital ° ° °  [address], [address], [place], [place]  [address] ? ° [address] [address], , [place]   [address] °[phone] ? [phone] ? FAX [phone] °Web site: [email or web address] °

## 2017-08-05 NOTE — Interval H&P Note (Signed)
History and Physical Interval Note:no change in H and P  08/05/2017 7:54 AM  Bradley Davila  has presented today for surgery, with the diagnosis of umbilical hernia  The various methods of treatment have been discussed with the patient and family. After consideration of risks, benefits and other options for treatment, the patient has consented to  Procedure(s): UMBILICAL HERNIA REPAIR WITH MESH (N/A) INSERTION OF MESH (N/A) as a surgical intervention .  The patient's history has been reviewed, patient examined, no change in status, stable for surgery.  I have reviewed the patient's chart and labs.  Questions were answered to the patient's satisfaction.     Brenisha Tsui A

## 2017-08-05 NOTE — Telephone Encounter (Signed)
OSA screening noted but sig neg responses:  Do you snore loudly (loud enough to be heard through closed doors)?  0  Do you often feel tired, fatigued, or sleepy during the daytime (such as falling asleep during driving or talking to someone)? 0   Has anyone observed you stop breathing during your sleep? 0   Can follow clinically.

## 2017-08-05 NOTE — Anesthesia Postprocedure Evaluation (Signed)
Anesthesia Post Note  Patient: Bradley Davila  Procedure(s) Performed: UMBILICAL HERNIA REPAIR (N/A Abdomen) INSERTION OF MESH (N/A Abdomen)     Patient location during evaluation: PACU Anesthesia Type: General Level of consciousness: awake Pain management: pain level controlled Vital Signs Assessment: post-procedure vital signs reviewed and stable Respiratory status: spontaneous breathing Cardiovascular status: stable Anesthetic complications: no    Last Vitals:  Vitals:   08/05/17 1020 08/05/17 1050  BP: 120/77 111/73  Pulse: 85 81  Resp: 15 17  Temp: 36.4 C   SpO2: 93% 99%    Last Pain:  Vitals:   08/05/17 1020  TempSrc:   PainSc: Asleep                 Rodnisha Blomgren

## 2017-08-06 ENCOUNTER — Other Ambulatory Visit: Payer: Self-pay

## 2017-08-06 DIAGNOSIS — G40909 Epilepsy, unspecified, not intractable, without status epilepticus: Secondary | ICD-10-CM | POA: Diagnosis not present

## 2017-08-06 DIAGNOSIS — Z79899 Other long term (current) drug therapy: Secondary | ICD-10-CM | POA: Diagnosis not present

## 2017-08-06 DIAGNOSIS — Z794 Long term (current) use of insulin: Secondary | ICD-10-CM | POA: Diagnosis not present

## 2017-08-06 DIAGNOSIS — F419 Anxiety disorder, unspecified: Secondary | ICD-10-CM | POA: Diagnosis not present

## 2017-08-06 DIAGNOSIS — Z9049 Acquired absence of other specified parts of digestive tract: Secondary | ICD-10-CM | POA: Diagnosis not present

## 2017-08-06 DIAGNOSIS — F329 Major depressive disorder, single episode, unspecified: Secondary | ICD-10-CM | POA: Diagnosis not present

## 2017-08-06 DIAGNOSIS — I69321 Dysphasia following cerebral infarction: Secondary | ICD-10-CM | POA: Diagnosis not present

## 2017-08-06 DIAGNOSIS — E119 Type 2 diabetes mellitus without complications: Secondary | ICD-10-CM | POA: Diagnosis not present

## 2017-08-06 DIAGNOSIS — K429 Umbilical hernia without obstruction or gangrene: Secondary | ICD-10-CM | POA: Diagnosis not present

## 2017-08-06 LAB — GLUCOSE, CAPILLARY: GLUCOSE-CAPILLARY: 166 mg/dL — AB (ref 65–99)

## 2017-08-06 MED ORDER — ACETAMINOPHEN 325 MG PO TABS: ORAL_TABLET | ORAL | Status: DC

## 2017-08-06 MED ORDER — ACETAMINOPHEN 325 MG PO TABS: ORAL_TABLET | ORAL | Status: AC

## 2017-08-06 MED ORDER — ACETAMINOPHEN 325 MG PO TABS: 650.0000 mg | ORAL_TABLET | Freq: Four times a day (QID) | ORAL | Status: DC | PRN

## 2017-08-06 NOTE — Progress Notes (Signed)
Patient discharged to home with instructions and prescription. 

## 2017-08-06 NOTE — Progress Notes (Signed)
1 Day Post-Op    CC: Umbilical hernia  Subjective: Patient is tolerating breakfast but not very hungry.  He reports he is up walking a couple times.  Sore but no significant issues.  Incision looks good.  Objective: Vital signs in last 24 hours: Temp:  [97.6 F (36.4 C)-98.5 F (36.9 C)] 98.5 F (36.9 C) (11/22 0616) Pulse Rate:  [81-107] 92 (11/22 0616) Resp:  [0-20] 18 (11/22 0616) BP: (111-155)/(71-85) 127/74 (11/22 0616) SpO2:  [91 %-99 %] 91 % (11/22 0616) Weight:  [101.6 kg (224 lb)] 101.6 kg (224 lb) (11/21 1322) Last BM Date: 08/05/17 932PO 653 IV 450 urine Afebrile, VSS Glucose is up  No labs   Intake/Output from previous day: 11/21 0701 - 11/22 0700 In: 1585.3 [P.O.:932; I.V.:653.3] Out: 453 [Urine:450; Blood:3] Intake/Output this shift: No intake/output data recorded.  General appearance: alert, cooperative and no distress Resp: clear to auscultation bilaterally GI: Large abdomen, slightly distended and sore.  Tolerating breakfast.  Lab Results:  Recent Labs    08/03/17 1550  WBC 10.6*  HGB 15.3  HCT 44.8  PLT 334    BMET Recent Labs    08/03/17 1550  NA 134*  K 4.2  CL 100*  CO2 25  GLUCOSE 194*  BUN 12  CREATININE 0.93  CALCIUM 9.1   PT/INR No results for input(s): LABPROT, INR in the last 72 hours.  No results for input(s): AST, ALT, ALKPHOS, BILITOT, PROT, ALBUMIN in the last 168 hours.   Lipase  No results found for: LIPASE   Prior to Admission medications   Medication Sig Start Date End Date Taking? Authorizing Provider  EPINEPHrine 0.3 mg/0.3 mL IJ SOAJ injection Inject 0.3 mg into the muscle as needed. 03/30/13  Yes Copland, Frederico Hamman, MD  fluocinonide cream (LIDEX) 3.53 % Apply 1 application topically 2 (two) times daily. 04/13/14  Yes Tonia Ghent, MD  levETIRAcetam (KEPPRA) 1000 MG tablet Take one in am and 1.5 tab.  in evening by mouth. Patient taking differently: Take 1,000-1,500 mg See admin instructions by mouth.  Take one tablet (1023m) in the morning and one and half tablets (15067m in the evening. 03/10/17  Yes Dohmeier, CaAsencion PartridgeMD  meloxicam (MOBIC) 15 MG tablet Take 1 tablet (15 mg total) by mouth daily as needed for pain (with food). 01/14/17  Yes DuTonia GhentMD  metFORMIN (GLUCOPHAGE-XR) 500 MG 24 hr tablet Take 2-4 tablets (1,000-2,000 mg total) daily with breakfast by mouth. 07/22/17  Yes DuTonia GhentMD  oxcarbazepine (TRILEPTAL) 600 MG tablet Take 1 tablet (600 mg total) by mouth 2 (two) times daily. 03/10/17  Yes Dohmeier, CaAsencion PartridgeMD  Alcohol Swabs (B-D SINGLE USE SWABS REGULAR) PADS Use as instructed to test blood sugar once daily or as needed.  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 05/09/16   DuTonia GhentMD  Blood Glucose Calibration (TRUE METRIX LEVEL 1) Low SOLN Use as instructed to test glucometer.  Diagnosis:  E11.69  Non insulin dependent. 05/09/16   DuTonia GhentMD  Blood Glucose Monitoring Suppl (TRUE METRIX AIR GLUCOSE METER) w/Device KIT Use as instructed to check blood sugar once daily or as needed.Diagnosis: E11.65Non insulin dependent.    [provider]  Blood Glucose Monitoring Suppl (TRUE METRIX AIR GLUCOSE METER) w/Device KIT 1 kit by In Vitro route daily. Use as instructed to check blood sugar once daily or as needed.  Diagnosis: E11.65  Non insulin dependent. 05/09/16   DuTonia GhentMD  glucose blood (TRUE METRIX BLOOD GLUCOSE TEST) test strip Use as instructed to test blood sugar once daily or as needed.  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 07/14/17   Tonia Ghent, MD  lisinopril (ZESTRIL) 2.5 MG tablet Take 1 tablet (2.5 mg total) by mouth daily. Patient not taking: Reported on 08/03/2017 10/02/14   Tonia Ghent, MD  loratadine (CLARITIN) 5 MG/5ML syrup Take 2-10 mLs (2-10 mg total) by mouth daily as needed for allergies or rhinitis. 04/12/17   Tonia Ghent, MD  oxyCODONE (OXY IR/ROXICODONE) 5 MG immediate release tablet Take  1-2 tablets (5-10 mg total) by mouth every 6 (six) hours as needed for moderate pain, severe pain or breakthrough pain. 08/05/17   Coralie Keens, MD  TRUEPLUS LANCETS 33G MISC Use as instructed to test blood sugar once daily or as needed.  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 05/09/16   Tonia Ghent, MD    Medications: . enoxaparin (LOVENOX) injection  40 mg Subcutaneous Q24H  . insulin aspart  0-15 Units Subcutaneous TID WC  . insulin aspart  0-5 Units Subcutaneous QHS  . levETIRAcetam  1,000 mg Oral Daily  . levETIRAcetam  1,500 mg Oral QHS  . oxcarbazepine  600 mg Oral BID   . sodium chloride 50 mL/hr at 08/06/17 0545   Anti-infectives (From admission, onward)   Start     Dose/Rate Route Frequency Ordered Stop   08/05/17 0815  ceFAZolin (ANCEF) IVPB 2g/100 mL premix     2 g 200 mL/hr over 30 Minutes Intravenous On call to O.R. 08/05/17 0643 08/05/17 0837   08/05/17 0648  ceFAZolin (ANCEF) 2-4 GM/100ML-% IVPB    Comments:  Scronce, Trina   : cabinet override      08/05/17 0648 08/05/17 0837      Assessment/Plan Umbilical hernia  S/P umbilical hernia repair with insertion of mesh, 08/05/17, DR. Coralie Keens  Active problems: Anxiety Disorder  Cerebrovascular Accident  Depression  Diabetes Mellitus -patient reports sugars are variable at home. Diverticulosis  Seizure Disorder   FEN:  IV fluids/carb mod diet ID:  Pre op  DVT:  Lovnenox  Plan: Discharge home this a.m.,  follow-up with Dr. Ninfa Linden.     LOS: 0 days    Rosezella Kronick 08/06/2017 [phone]

## 2017-08-07 ENCOUNTER — Encounter (HOSPITAL_COMMUNITY): Payer: Self-pay | Admitting: Surgery

## 2017-08-10 NOTE — Discharge Summary (Signed)
Physician Discharge Summary  Patient ID: EDMUND HOLCOMB MRN: [id number] DOB/AGE: Feb 11, 1961 56 y.o.  Admit date: 08/05/2017 Discharge date: 08/10/2017  Admission Diagnoses:  Discharge Diagnoses:  Active Problems:   Umbilical hernia history of seizure disorder  Discharged Condition: good  Hospital Course: uneventful post op recovery.  Discharged home pod #1  Consults: None  Significant Diagnostic Studies:   Treatments: surgery: umbilical hernia repair with mesh  Discharge Exam: Blood pressure 127/74, pulse 92, temperature 98.5 F (36.9 C), resp. rate 18, height '5\' 6"'$  (1.676 m), weight 101.6 kg (224 lb), SpO2 91 %. General appearance: alert, cooperative and no distress Incision/Wound:abdomen soft, incision clean  Disposition: 01-Home or Self Care   Allergies as of 08/06/2017      Reactions   Benadryl [diphenhydramine Hcl]    Caused seizures.    Yellow Jacket Venom [honey Bee Venom]    Facial swelling after sting   Carbamazepine    Inc in seizures   Lamotrigine    Inc in seizures   Metformin And Related    Tolerates '1500mg'$  but not '2000mg'$  a day   Phenobarbital    rash      Medication List    TAKE these medications   acetaminophen 325 MG tablet Commonly known as:  TYLENOL You can take 2 tablets every 6 hours as needed for pain. Use this first then the prescribed pain medication.   DO NOT TAKE MORE THAN 4000 MG OF TYLENOL PER DAY.  IT CAN HARM YOUR LIVER.   B-D SINGLE USE SWABS REGULAR Pads Use as instructed to test blood sugar once daily or as needed.  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   EPINEPHrine 0.3 mg/0.3 mL Soaj injection Commonly known as:  EPI-PEN Inject 0.3 mg into the muscle as needed.   fluocinonide cream 0.05 % Commonly known as:  LIDEX Apply 1 application topically 2 (two) times daily.   glucose blood test strip Commonly known as:  TRUE METRIX BLOOD GLUCOSE TEST Use as instructed to test blood sugar once daily or as needed.   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   levETIRAcetam 1000 MG tablet Commonly known as:  KEPPRA Take one in am and 1.5 tab.  in evening by mouth. What changed:    how much to take  how to take this  when to take this  additional instructions   lisinopril 2.5 MG tablet Commonly known as:  ZESTRIL Take 1 tablet (2.5 mg total) by mouth daily.   loratadine 5 MG/5ML syrup Commonly known as:  CLARITIN Take 2-10 mLs (2-10 mg total) by mouth daily as needed for allergies or rhinitis.   meloxicam 15 MG tablet Commonly known as:  MOBIC Take 1 tablet (15 mg total) by mouth daily as needed for pain (with food).   metFORMIN 500 MG 24 hr tablet Commonly known as:  GLUCOPHAGE-XR Take 2-4 tablets (1,000-2,000 mg total) daily with breakfast by mouth.   oxcarbazepine 600 MG tablet Commonly known as:  TRILEPTAL Take 1 tablet (600 mg total) by mouth 2 (two) times daily.   oxyCODONE 5 MG immediate release tablet Commonly known as:  Oxy IR/ROXICODONE Take 1-2 tablets (5-10 mg total) by mouth every 6 (six) hours as needed for moderate pain, severe pain or breakthrough pain.   TRUE METRIX AIR GLUCOSE METER w/Device Kit Use as instructed to check blood sugar once daily or as needed.Diagnosis: E11.65Non insulin dependent.   TRUE METRIX AIR GLUCOSE METER w/Device Kit 1 kit by In Vitro  route daily. Use as instructed to check blood sugar once daily or as needed.  Diagnosis: E11.65  Non insulin dependent.   TRUE METRIX LEVEL 1 Low Soln Use as instructed to test glucometer.  Diagnosis:  E11.69  Non insulin dependent.   TRUEPLUS LANCETS 33G Misc Use as instructed to test blood sugar once daily or as needed.  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      Follow-up Information    Coralie Keens, MD. Schedule an appointment as soon as possible for a visit in 3 week(s).   Specialty:  General Surgery Contact information: [address] [place]  [phone]           Signed: Harl Bowie 08/10/2017, 9:10 AM

## 2017-09-03 ENCOUNTER — Ambulatory Visit: Payer: Medicare HMO | Admitting: Adult Health

## 2017-10-12 ENCOUNTER — Other Ambulatory Visit: Payer: Self-pay | Admitting: Family Medicine

## 2017-10-12 DIAGNOSIS — E118 Type 2 diabetes mellitus with unspecified complications: Principal | ICD-10-CM

## 2017-10-12 DIAGNOSIS — IMO0002 Reserved for concepts with insufficient information to code with codable children: Secondary | ICD-10-CM

## 2017-10-12 DIAGNOSIS — E1165 Type 2 diabetes mellitus with hyperglycemia: Secondary | ICD-10-CM

## 2017-10-16 ENCOUNTER — Encounter: Payer: Self-pay | Admitting: Family Medicine

## 2017-10-16 ENCOUNTER — Ambulatory Visit (INDEPENDENT_AMBULATORY_CARE_PROVIDER_SITE_OTHER): Payer: Medicare HMO

## 2017-10-16 ENCOUNTER — Ambulatory Visit (INDEPENDENT_AMBULATORY_CARE_PROVIDER_SITE_OTHER): Payer: Medicare HMO | Admitting: Family Medicine

## 2017-10-16 VITALS — BP 124/86 | HR 77 | Temp 97.5°F | Ht 66.0 in | Wt 220.2 lb

## 2017-10-16 DIAGNOSIS — Z0000 Encounter for general adult medical examination without abnormal findings: Secondary | ICD-10-CM | POA: Diagnosis not present

## 2017-10-16 DIAGNOSIS — E118 Type 2 diabetes mellitus with unspecified complications: Secondary | ICD-10-CM

## 2017-10-16 DIAGNOSIS — R399 Unspecified symptoms and signs involving the genitourinary system: Secondary | ICD-10-CM

## 2017-10-16 DIAGNOSIS — IMO0002 Reserved for concepts with insufficient information to code with codable children: Secondary | ICD-10-CM

## 2017-10-16 DIAGNOSIS — R21 Rash and other nonspecific skin eruption: Secondary | ICD-10-CM | POA: Diagnosis not present

## 2017-10-16 DIAGNOSIS — G40309 Generalized idiopathic epilepsy and epileptic syndromes, not intractable, without status epilepticus: Secondary | ICD-10-CM

## 2017-10-16 DIAGNOSIS — E1165 Type 2 diabetes mellitus with hyperglycemia: Secondary | ICD-10-CM

## 2017-10-16 LAB — LIPID PANEL
CHOLESTEROL: 223 mg/dL — AB (ref 0–200)
HDL: 47 mg/dL (ref >39.00)
LDL CALC: 144 mg/dL — AB (ref 0–99)
NonHDL: 176.16
TRIGLYCERIDES: 163 mg/dL — AB (ref 0.0–149.0)
Total CHOL/HDL Ratio: 5
VLDL: 32.6 mg/dL (ref 0.0–40.0)

## 2017-10-16 LAB — COMPREHENSIVE METABOLIC PANEL
ALBUMIN: 4.4 g/dL (ref 3.5–5.2)
ALK PHOS: 69 U/L (ref 39–117)
ALT: 38 U/L (ref 0–53)
AST: 22 U/L (ref 0–37)
BUN: 14 mg/dL (ref 6–23)
CALCIUM: 9.4 mg/dL (ref 8.4–10.5)
CHLORIDE: 100 meq/L (ref 96–112)
CO2: 29 mEq/L (ref 19–32)
CREATININE: 0.95 mg/dL (ref 0.40–1.50)
GFR: 86.56 mL/min (ref >60.00)
Glucose, Bld: 153 mg/dL — ABNORMAL HIGH (ref 70–99)
Potassium: 4 mEq/L (ref 3.5–5.1)
Sodium: 138 mEq/L (ref 135–145)
Total Bilirubin: 0.4 mg/dL (ref 0.2–1.2)
Total Protein: 8.2 g/dL (ref 6.0–8.3)

## 2017-10-16 LAB — HEMOGLOBIN A1C: Hgb A1c MFr Bld: 8.8 % — ABNORMAL HIGH (ref 4.6–6.5)

## 2017-10-16 MED ORDER — GLUCOSE BLOOD VI STRP: ORAL_STRIP | 3 refills | Status: DC

## 2017-10-16 MED ORDER — NYSTATIN 100000 UNIT/GM EX POWD: Freq: Three times a day (TID) | CUTANEOUS | 2 refills | Status: DC

## 2017-10-16 NOTE — Patient Instructions (Signed)
Bradley Davila , Thank you for taking time to come for your Medicare Wellness Visit. I appreciate your ongoing commitment to your health goals. Please review the following plan we discussed and let me know if I can assist you in the future.   These are the goals we discussed: Goals    . DIET - INCREASE WATER INTAKE     Starting 10/16/2017, I will attempt to drink at least 6 glasses of water daily.        This is a list of the screening recommended for you and due dates:  Health Maintenance  Topic Date Due  . Pneumococcal vaccine (1) 04/15/2018*  . Flu Shot  04/15/2018*  . Eye exam for diabetics  12/14/2018*  . Hemoglobin A1C  04/15/2018  . Complete foot exam   07/14/2018  . Colon Cancer Screening  06/17/2019  . Tetanus Vaccine  06/09/2020  .  Hepatitis C: One time screening is recommended by Center for Disease Control  (CDC) for  adults born from 50 through 1965.   Completed  . HIV Screening  Completed  *Topic was postponed. The date shown is not the original due date.   Preventive Care for Adults  A healthy lifestyle and preventive care can promote health and wellness. Preventive health guidelines for adults include the following key practices.  . A routine yearly physical is a good way to check with your health care provider about your health and preventive screening. It is a chance to share any concerns and updates on your health and to receive a thorough exam.  . Visit your dentist for a routine exam and preventive care every 6 months. Brush your teeth twice a day and floss once a day. Good oral hygiene prevents tooth decay and gum disease.  . The frequency of eye exams is based on your age, health, family medical history, use  of contact lenses, and other factors. Follow your health care provider's recommendations for frequency of eye exams.  . Eat a healthy diet. Foods like vegetables, fruits, whole grains, low-fat dairy products, and lean protein foods contain the nutrients you  need without too many calories. Decrease your intake of foods high in solid fats, added sugars, and salt. Eat the right amount of calories for you. Get information about a proper diet from your health care provider, if necessary.  . Regular physical exercise is one of the most important things you can do for your health. Most adults should get at least 150 minutes of moderate-intensity exercise (any activity that increases your heart rate and causes you to sweat) each week. In addition, most adults need muscle-strengthening exercises on 2 or more days a week.  Silver Sneakers may be a benefit available to you. To determine eligibility, you may visit the website: www.silversneakers.com or contact program at (575)772-2130 Mon-Fri between 8AM-8PM.   . Maintain a healthy weight. The body mass index (BMI) is a screening tool to identify possible weight problems. It provides an estimate of body fat based on height and weight. Your health care provider can find your BMI and can help you achieve or maintain a healthy weight.   For adults 20 years and older: ? A BMI below 18.5 is considered underweight. ? A BMI of 18.5 to 24.9 is normal. ? A BMI of 25 to 29.9 is considered overweight. ? A BMI of 30 and above is considered obese.   . Maintain normal blood lipids and cholesterol levels by exercising and minimizing your intake of  saturated fat. Eat a balanced diet with plenty of fruit and vegetables. Blood tests for lipids and cholesterol should begin at age 26 and be repeated every 5 years. If your lipid or cholesterol levels are high, you are over 50, or you are at high risk for heart disease, you [place] need your cholesterol levels checked more frequently. Ongoing high lipid and cholesterol levels should be treated with medicines if diet and exercise are not working.  . If you smoke, find out from your health care provider how to quit. If you do not use tobacco, please do not start.  . If you choose to drink  alcohol, please do not consume more than 2 drinks per day. One drink is considered to be 12 ounces (355 mL) of beer, 5 ounces (148 mL) of wine, or 1.5 ounces (44 mL) of liquor.  . If you are 78-88 years old, ask your health care provider if you should take aspirin to prevent strokes.  . Use sunscreen. Apply sunscreen liberally and repeatedly throughout the day. You should seek shade when your shadow is shorter than you. Protect yourself by wearing long sleeves, pants, a wide-brimmed hat, and sunglasses year round, whenever you are outdoors.  . Once a month, do a whole body skin exam, using a mirror to look at the skin on your back. Tell your health care provider of new moles, moles that have irregular borders, moles that are larger than a pencil eraser, or moles that have changed in shape or color.

## 2017-10-16 NOTE — Progress Notes (Signed)
Pre visit review using our clinic review tool, if applicable. No additional management support is needed unless otherwise documented below in the visit note. 

## 2017-10-16 NOTE — Progress Notes (Signed)
Subjective:   Bradley Davila is a 57 y.o. male who presents for an Initial Medicare Annual Wellness Visit.  Review of Systems  N/A Cardiac Risk Factors include: male gender;advanced age (>62mn, >>22women);obesity (BMI >30kg/m2);diabetes mellitus    Objective:    Today's Vitals   10/16/17 1000  BP: 124/86  Pulse: 77  Temp: (!) 97.5 F (36.4 C)  TempSrc: Tympanic  SpO2: 95%  Weight: 220 lb 4 oz (99.9 kg)  Height: '5\' 6"'$  (1.676 m)  PainSc: 0-No pain   Body mass index is 35.55 kg/m.  Advanced Directives 10/16/2017 08/05/2017 08/03/2017 06/07/2014  Does Patient Have a Medical Advance Directive? No No No No  Does patient want to make changes to medical advance directive? - - No - Patient declined -  Would patient like information on creating a medical advance directive? No - Patient declined No - Patient declined - No - patient declined information    Current Medications (verified) Outpatient Encounter Medications as of 10/16/2017  Medication Sig  . acetaminophen (TYLENOL) 325 MG tablet You can take 2 tablets every 6 hours as needed for pain. Use this first then the prescribed pain medication.   DO NOT TAKE MORE THAN 4000 MG OF TYLENOL PER DAY.  IT CAN HARM YOUR LIVER.  .Marland KitchenEPINEPHrine 0.3 mg/0.3 mL IJ SOAJ injection Inject 0.3 mg into the muscle as needed.  . fluocinonide cream (LIDEX) 03.23% Apply 1 application topically 2 (two) times daily.  .Marland KitchenlevETIRAcetam (KEPPRA) 1000 MG tablet Take one in am and 1.5 tab.  in evening by mouth. (Patient taking differently: Take 1,000-1,500 mg See admin instructions by mouth. Take one tablet ('1000mg'$ ) in the morning and one and half tablets ('1500mg'$ ) in the evening.)  . loratadine (CLARITIN) 5 MG/5ML syrup Take 2-10 mLs (2-10 mg total) by mouth daily as needed for allergies or rhinitis.  . meloxicam (MOBIC) 15 MG tablet Take 1 tablet (15 mg total) by mouth daily as needed for pain (with food).  . metFORMIN (GLUCOPHAGE-XR) 500 MG 24 hr tablet Take  2-4 tablets (1,000-2,000 mg total) daily with breakfast by mouth.  .Marland Kitchenoxcarbazepine (TRILEPTAL) 600 MG tablet Take 1 tablet (600 mg total) by mouth 2 (two) times daily.  . TRUEPLUS LANCETS 33G MISC Use as instructed to test blood sugar once daily or as needed.  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  . [DISCONTINUED] Alcohol Swabs (B-D SINGLE USE SWABS REGULAR) PADS Use as instructed to test blood sugar once daily or as needed.  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  . [DISCONTINUED] Blood Glucose Calibration (TRUE METRIX LEVEL 1) Low SOLN Use as instructed to test glucometer.  Diagnosis:  E11.69  Non insulin dependent.  . [DISCONTINUED] Blood Glucose Monitoring Suppl (TRUE METRIX AIR GLUCOSE METER) w/Device KIT Use as instructed to check blood sugar once daily or as needed.Diagnosis: E11.65Non insulin dependent.  . [DISCONTINUED] Blood Glucose Monitoring Suppl (TRUE METRIX AIR GLUCOSE METER) w/Device KIT 1 kit by In Vitro route daily. Use as instructed to check blood sugar once daily or as needed.  Diagnosis: E11.65  Non insulin dependent.  . [DISCONTINUED] glucose blood (TRUE METRIX BLOOD GLUCOSE TEST) test strip Use as instructed to test blood sugar once daily or as needed.  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  . [DISCONTINUED] lisinopril (ZESTRIL) 2.5 MG tablet Take 1 tablet (2.5 mg total) by mouth daily.  . [DISCONTINUED] oxyCODONE (OXY IR/ROXICODONE) 5 MG immediate release tablet Take 1-2 tablets (5-10 mg  total) by mouth every 6 (six) hours as needed for moderate pain, severe pain or breakthrough pain.   No facility-administered encounter medications on file as of 10/16/2017.     Allergies (verified) Benadryl [diphenhydramine hcl]; Yellow jacket venom [honey bee venom]; Carbamazepine; Lamotrigine; Metformin and related; and Phenobarbital   History: Past Medical History:  Diagnosis Date  . Complication of anesthesia    slow to wake up from anesthesia after gall bladder  surgery  . 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 Diabetes mellitus without complication ([place])   . Dysphagia as late effect of stroke   . Headache(784.0)   . Hypertension   . Seizures ([place])    h/o grand mal and staring episodes, followed by [facility] neuro, last grand mal July 09, 2013 last seizure  . 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 Stroke [facility])    childhood   Past Surgical History:  Procedure Laterality Date  . CHOLECYSTECTOMY  2007  . CYSTECTOMY     on buttock, s/p removal  . HERNIA REPAIR    . INSERTION OF MESH N/A 08/05/2017   Procedure: INSERTION OF MESH;  Surgeon: Coralie Keens, MD;  Location: [place];  Service: General;  Laterality: N/A;  . TONSILLECTOMY    . UMBILICAL HERNIA REPAIR  08/05/2017  . UMBILICAL HERNIA REPAIR N/A 08/05/2017   Procedure: UMBILICAL HERNIA REPAIR;  Surgeon: Coralie Keens, MD;  Location: [place];  Service: General;  Laterality: N/A;  . WISDOM TOOTH EXTRACTION     Family History  Problem Relation Age of Onset  . Hydrocephalus Mother   . Liver cancer Father   . Alcohol abuse Father   . Alcohol abuse Sister   . Heart disease Brother   . Colon cancer Neg Hx   . Prostate cancer Neg Hx    Social History   Socioeconomic History  . Marital status: Married    Spouse name: Clarene Critchley  . Number of children: 1  . Years of education: None  . Highest education level: None  Social Needs  . Financial resource strain: None  . Food insecurity - worry: None  . Food insecurity - inability: None  . Transportation needs - medical: None  . Transportation needs - non-medical: None  Occupational History    Employer: NOT EMPLOYED  Tobacco Use  . Smoking status: Former Research scientist (life sciences)  . Smokeless tobacco: Never Used  Substance and Sexual Activity  . Alcohol use: Yes    Comment: rarely - 4 to 5 times per year  . Drug use: No  . Sexual activity: Yes  Other Topics Concern  .  None  Social History Narrative   Married 2005, 1 daughter   Disabled due to cva   Tobacco Counseling Counseling given: No   Clinical Intake:  Pre-visit preparation completed: Yes  Pain : No/denies pain Pain Score: 0-No pain     Nutritional Status: BMI > 30  Obese Nutritional Risks: None Diabetes: Yes CBG done?: No(last BG was 153) Did pt. bring in CBG monitor from home?: No  How often do you need to have someone help you when you read instructions, pamphlets, or other written materials from your doctor or pharmacy?: 1 - Never What is the last grade level you completed in school?: 12th grade  Interpreter Needed?: No  Comments: pt lives with spouse Information entered by :: LPinson, LPN  Activities of Daily Living In your present state  of health, do you have any difficulty performing the following activities: 10/16/2017 08/05/2017  Hearing? Y Y  Comment related to stroke at birth -  Vision? N N  Difficulty concentrating or making decisions? Y N  Walking or climbing stairs? N N  Dressing or bathing? N Y  Doing errands, shopping? Y Y  Comment pt does not drive due to epilepsy -  Preparing Food and eating ? N -  Using the Toilet? N -  In the past six months, have you accidently leaked urine? N -  Do you have problems with loss of bowel control? N -  Managing your Medications? N -  Managing your Finances? Y -  Housekeeping or managing your Housekeeping? N -  Some recent data might be hidden     Immunizations and Health Maintenance  There is no immunization history on file for this patient. There are no preventive care reminders to display for this patient.  Patient Care Team: Tonia Ghent, MD as PCP - General (Family Medicine)  Indicate any recent Medical Services you may have received from other than [facility] providers in the past year (date may be approximate).    Assessment:   This is a routine wellness examination for [facility].  Hearing/Vision screen   Hearing Screening   '125Hz'$  '250Hz'$  '500Hz'$  '1000Hz'$  '2000Hz'$  '3000Hz'$  '4000Hz'$  '6000Hz'$  '8000Hz'$   Right ear:   40 40 40  0    Left ear:   40 40 40  0      Visual Acuity Screening   Right eye Left eye Both eyes  Without correction:     With correction: 20/40-1 20/25-1 20/20-1  Comments: Note: pt is wearing an older prescription; pt plans to make future eye exam to get new glasses   Dietary issues and exercise activities discussed: Current Exercise Habits: The patient does not participate in regular exercise at present, Exercise limited by: None identified  Goals    . DIET - INCREASE WATER INTAKE     Starting 10/16/2017, I will attempt to drink at least 6 glasses of water daily.       Depression Screen PHQ 2/9 Scores 10/16/2017 10/05/2013  PHQ - 2 Score 2 0  PHQ- 9 Score 10 -    Fall Risk Fall Risk  10/16/2017 10/05/2013  Falls in the past year? No No   Cognitive Function: MMSE - Mini Mental State Exam 10/16/2017  Orientation to time 5  Orientation to Place 5  Registration 3  Attention/ Calculation 0  Recall 3  Language- name 2 objects 0  Language- repeat 1  Language- follow 3 step command 3  Language- read & follow direction 0  Write a sentence 0  Copy design 0  Total score 20     PLEASE NOTE: A Mini-Cog screen was completed. Maximum score is 20. A value of 0 denotes this part of [place] MMSE was not completed or the patient failed this part of the Mini-Cog screening.   Mini-Cog Screening Orientation to Time - Max 5 pts Orientation to Place - Max 5 pts Registration - Max 3 pts Recall - Max 3 pts Language Repeat - Max 1 pts Language Follow 3 Step Command - Max 3 pts   Screening Tests Health Maintenance  Topic Date Due  . PNEUMOCOCCAL POLYSACCHARIDE VACCINE (1) 04/15/2018 (Originally 11/08/1962)  . INFLUENZA VACCINE  04/15/2018 (Originally 04/15/2017)  . OPHTHALMOLOGY EXAM  12/14/2018 (Originally 09/15/2015)  . HEMOGLOBIN A1C  04/15/2018  . FOOT EXAM  07/14/2018  . COLONOSCOPY  06/17/2019  . TETANUS/TDAP  06/09/2020  . Hepatitis C Screening  Completed  . HIV Screening  Completed       Plan:     I have personally reviewed, addressed, and noted the following in the patient's chart:  A. Medical and social history B. Use of alcohol, tobacco or illicit drugs  C. Current medications and supplements D. Functional ability and status E.  Nutritional status F.  Physical activity G. Advance directives H. List of other physicians I.  Hospitalizations, surgeries, and ER visits in previous 12 months J.  [place] to include hearing, vision, cognitive, depression L. Referrals and appointments - none  In addition, I have reviewed and discussed with patient certain preventive protocols, quality metrics, and best practice recommendations. A written personalized care plan for preventive services as well as general preventive health recommendations were provided to patient.  See attached scanned questionnaire for additional information.   Signed,   Lindell Noe, MHA, BS, LPN Health Coach

## 2017-10-16 NOTE — Patient Instructions (Signed)
Use nystatin powder on your crease and prep H on your rectum.   Let me talk to neurology.  Don't change your other meds for now.  We'll contact you with your lab report. Take care.  Glad to see you.  Update me as needed.

## 2017-10-16 NOTE — Progress Notes (Signed)
Mood d/w pt.  He typically has worse mood changes in the winter, around this time each year.  He has family stressors with disagreements with family in [place] state.  He has been dwelling on that.  His aunt isn't doing well, with health concerns but he hasn't been able to get good information about her.  No SI/HI.  He has tried to get set up with local support groups, is in an online support group.  Unclear how much the keppra affects his mood.  No h/o SA.  He can't drive and that causes some social isolation.  D/w pt, a lot of factors contribute.    Urination. Not nocturia.  In the day when he stands from sitting then he'll have to urinate.  Sugar has been 150-160s, occ ~200.  No burning with urination.  Some of this may be due to inc in fluid intake.    Hearing screen d/w pt.  Declined hearing aids.    Seizure meds per uro.  I'll defer.   No recent SZ.  Compliant.   Diabetes:  Using medications without difficulties: taking 2 tabs in the AM and 1-2 tabs metformin in the PM w/o troubles, depending on his sugar.  Hypoglycemic episodes: no Hyperglycemic episodes: no Feet problems: occ tingling in the feet, rarely.  Blood Sugars averaging: 150-160s usually  eye exam within last year:pending, wife to call.   Labs pending.   He had trouble swallowing lisinopril due the small size of the pill.  Is off that med. No ADE on med the med itself, meaning no allergy.    He hs rectal irritation and needed that checked.  See below.   PMH and SH reviewed  Meds, vitals, and allergies reviewed.   ROS: Per HPI unless specifically indicated in ROS section   GEN: nad, alert and oriented, speech at baseline.   HEENT: mucous membranes moist NECK: supple w/o LA CV: rrr. PULM: ctab, no inc wob ABD: soft, +bs EXT: no edema SKIN: no acute rash except for fungal changes in the gluteal crease with nonthrombosed ext hemorrhoid noted.  No gross blood.    Diabetic foot exam: Normal inspection No skin  breakdown No calluses  Normal DP pulses Normal sensation to light touch and monofilament Nails normal

## 2017-10-16 NOTE — Progress Notes (Signed)
PCP notes:   Health maintenance:  Eye exam - addressed Urine albumin - postponed/future labs  Abnormal screenings:   Depression score:10 Depression screen [facility] 2/9 10/16/2017 10/05/2013  Decreased Interest 1 0  Down, Depressed, Hopeless 1 0  PHQ - 2 Score 2 0  Altered sleeping 1 -  Tired, decreased energy 1 -  Change in appetite 0 -  Feeling bad or failure about yourself  3 -  Trouble concentrating 0 -  Moving slowly or fidgety/restless 3 -  Suicidal thoughts 0 -  PHQ-9 Score 10 -  Difficult doing work/chores Somewhat difficult -   Hearing - failed  Hearing Screening   125Hz  250Hz  500Hz  1000Hz  2000Hz  3000Hz  4000Hz  6000Hz  8000Hz   Right ear:   40 40 40  0    Left ear:   40 40 40  0     Patient concerns:   Increased frequency of urination - PCP notified.  Nurse concerns:  None  Next PCP appt:   10/16/2017 @ 1045  I reviewed health advisor's note, was available for consultation on the day of service listed in this note, and agree with documentation and plan. Elsie Stain, MD.

## 2017-10-19 DIAGNOSIS — R399 Unspecified symptoms and signs involving the genitourinary system: Secondary | ICD-10-CM | POA: Insufficient documentation

## 2017-10-19 NOTE — Assessment & Plan Note (Signed)
Unclear how much of this is related to his sugar, see notes on labs.

## 2017-10-19 NOTE — Assessment & Plan Note (Signed)
It is unclear how much keppra could be affecting his mood.  He has other contributory factors with family stressors and his health concerns.  No SI/HI, okay for outpatient f/u.  I'll check with neuro about his keppra dosing and we'll go from there.  He agrees.

## 2017-10-19 NOTE — Assessment & Plan Note (Addendum)
See notes on labs.  No change in meds at time of OV.   >25 minutes spent in face to face time with patient, >50% spent in counselling or coordination of care, discussion mood, h/o SZ, DM meds/diet, etc.

## 2017-10-19 NOTE — Assessment & Plan Note (Signed)
D/w pt about using nysatin for rash and topical otc tx for ext hemorrhoids.

## 2017-10-22 ENCOUNTER — Telehealth: Payer: Self-pay | Admitting: Neurology

## 2017-10-22 NOTE — Telephone Encounter (Signed)
Keppra can induce depression and can worsen existing depression - Def. A possibility. He is also on Trileptal, a more sedating agent .   Can we get a PHQ 9 and see if an SSRI is needed. ?

## 2017-10-22 NOTE — Telephone Encounter (Signed)
-----   Message from Ward Givens, NP sent at 10/19/2017  9:36 AM EST ----- Dr. Brett Fairy,   I received this message from Dr. Damita Dunnings. However I have never seen this patient. Can you discuss this with Dr. Damita Dunnings?  Megan  ----- Message ----- From: Tonia Ghent, MD Sent: 10/19/2017  12:21 AM To: Ward Givens, NP  His mood is low.  He has mult issues that likely contribute.  How much do you think keppra could be affecting his mood?  Please let me know.  Thanks.  Brigitte Pulse

## 2017-10-23 NOTE — Telephone Encounter (Signed)
I didn't do a PHQ9.  Regardless of the score, his mood is lower, he is more irritable and less patient. This typically happens around this time of year.  He admits to being depression but not suicidal.  Can you please get him seen in clinic to offer some advice about his keppra dosing?  If keppra can't be changed or isn't contributing, then I'll be glad to talk to him about depression treatment here at clinic.  Thanks.

## 2017-10-30 NOTE — Telephone Encounter (Signed)
Dear Dr. Damita Dunnings, Keppra is so appealing  because it is very easy on renal function and liver function in comparison to other medications.  It never leads to any electrolyte abnormality.   Given that he is on a second drug, namely Trileptal, he may have some additional depressive side effects from oxcarbazepine.  If his depression has never progressed to suicidal ideation, I would rather treat him with an antidepressant and leave him on the current medication.   I will be happy to see him in clinic and discuss if he feels more comfortable reducing Keppra in order to increase Trileptal, long-term we may change or Lamictal. Larey Seat, MD

## 2017-11-01 NOTE — Telephone Encounter (Signed)
Per notes from neuro: Myriam Jacobson, this patient needs PHQ 9 for depression documentation in re visit soon- with NP or me.  (Routing comment).    I'll defer to neuro.  I greatly appreciate the input from neuro.  Thanks.

## 2017-11-02 ENCOUNTER — Telehealth: Payer: Self-pay | Admitting: *Deleted

## 2017-11-02 NOTE — Telephone Encounter (Signed)
Pharmacy faxed request for alternative medication to [facility] which is not covered by insurance.  Please send for Metformin ER 500 mg tabs.

## 2017-11-03 MED ORDER — METFORMIN HCL ER 500 MG PO TB24: 1000.0000 mg | ORAL_TABLET | Freq: Every day | ORAL | 3 refills | Status: DC

## 2017-11-03 NOTE — Telephone Encounter (Signed)
Sent. Thanks.   

## 2017-11-03 NOTE — Addendum Note (Signed)
Addended by: Tonia Ghent on: 11/03/2017 06:18 AM   Modules accepted: Orders

## 2017-11-08 ENCOUNTER — Other Ambulatory Visit: Payer: Self-pay | Admitting: Family Medicine

## 2017-11-08 MED ORDER — METFORMIN HCL ER 500 MG PO TB24: 1000.0000 mg | ORAL_TABLET | Freq: Every day | ORAL | 3 refills | Status: DC

## 2017-12-24 ENCOUNTER — Ambulatory Visit: Payer: Medicare HMO | Admitting: Adult Health

## 2018-02-25 ENCOUNTER — Ambulatory Visit (INDEPENDENT_AMBULATORY_CARE_PROVIDER_SITE_OTHER): Payer: Medicare HMO | Admitting: Family Medicine

## 2018-02-25 ENCOUNTER — Encounter: Payer: Self-pay | Admitting: Family Medicine

## 2018-02-25 VITALS — BP 122/80 | HR 92 | Temp 98.4°F | Ht 66.0 in | Wt 223.5 lb

## 2018-02-25 DIAGNOSIS — E1165 Type 2 diabetes mellitus with hyperglycemia: Secondary | ICD-10-CM | POA: Diagnosis not present

## 2018-02-25 DIAGNOSIS — E118 Type 2 diabetes mellitus with unspecified complications: Secondary | ICD-10-CM | POA: Diagnosis not present

## 2018-02-25 DIAGNOSIS — IMO0002 Reserved for concepts with insufficient information to code with codable children: Secondary | ICD-10-CM

## 2018-02-25 LAB — POCT GLYCOSYLATED HEMOGLOBIN (HGB A1C): HEMOGLOBIN A1C: 8.4 % — AB (ref 4.0–5.6)

## 2018-02-25 MED ORDER — SITAGLIPTIN PHOSPHATE 50 MG PO TABS: 25.0000 mg | ORAL_TABLET | Freq: Every day | ORAL | 3 refills | Status: DC

## 2018-02-25 MED ORDER — METFORMIN HCL ER 500 MG PO TB24: 1000.0000 mg | ORAL_TABLET | Freq: Every day | ORAL | Status: DC

## 2018-02-25 NOTE — Patient Instructions (Signed)
Cut the metformin back to the max tolerated dose (2 or 3 pills a day) Add on 25mg  januvia and update me as needed.  Recheck in about 3 months.  The only lab you need to have done for your next diabetic visit is an A1c.  We can do this with a fingerstick test at the office visit.  You do not need a lab visit ahead of time for this.  It does not matter if you are fasting when the lab is done.    Take care.  Glad to see you.

## 2018-02-25 NOTE — Progress Notes (Signed)
Diabetes:  Using medications without difficulties: see below Hypoglycemic episodes: no Hyperglycemic episodes:no Feet problems: tingling/itching in the R foot now, noted more when sugar is up.   Blood Sugars averaging: ~160s.   A1c d/w pt.   He had a manufacturer change on metformin.  More diarrhea on med now.   D/w pt about A1c and options.   Meds, vitals, and allergies reviewed.   ROS: Per HPI unless specifically indicated in ROS section   GEN: nad, alert and oriented, speech at baseline.  HEENT: mucous membranes moist NECK: supple w/o LA CV: rrr. PULM: ctab, no inc wob ABD: soft, +bs EXT: no edema

## 2018-02-26 NOTE — Assessment & Plan Note (Signed)
He is having GI upset on higher dose of metformin.  Reasonable to cut back to 2 to 3 tablets/day and see what he can tolerate.  Reasonable to add on Januvia in the meantime.  Start with 25 mg.  Routine cautions given.  If he has any adverse effect on the medication and he will stop it and let me know.  Still okay for outpatient follow-up.  Recheck in a few months.  He agrees. Diet and exercise encouraged.

## 2018-03-16 DIAGNOSIS — E119 Type 2 diabetes mellitus without complications: Secondary | ICD-10-CM | POA: Diagnosis not present

## 2018-03-16 DIAGNOSIS — T63461A Toxic effect of venom of wasps, accidental (unintentional), initial encounter: Secondary | ICD-10-CM | POA: Diagnosis not present

## 2018-05-22 ENCOUNTER — Other Ambulatory Visit: Payer: Self-pay | Admitting: Family Medicine

## 2018-05-22 ENCOUNTER — Other Ambulatory Visit: Payer: Self-pay | Admitting: Neurology

## 2018-05-24 NOTE — Telephone Encounter (Signed)
Sent. Thanks.   

## 2018-05-24 NOTE — Telephone Encounter (Signed)
Electronic refill request Last office visit 02/25/18 Last refill 01/14/17 #90/3

## 2018-06-09 ENCOUNTER — Other Ambulatory Visit: Payer: Self-pay | Admitting: Neurology

## 2018-06-09 ENCOUNTER — Telehealth: Payer: Self-pay | Admitting: Neurology

## 2018-06-09 MED ORDER — LEVETIRACETAM 1000 MG PO TABS: ORAL_TABLET | ORAL | 0 refills | Status: DC

## 2018-06-09 NOTE — Telephone Encounter (Signed)
rx refilled until patient next apt.

## 2018-06-09 NOTE — Telephone Encounter (Signed)
Pts wife Theresa(on DPR) called to schedule pt follow up for 12/26 requesting refills for levETIRAcetam (KEPPRA) 1000 MG tablet sent through [facility]

## 2018-07-12 ENCOUNTER — Ambulatory Visit: Payer: Medicare HMO | Admitting: Family Medicine

## 2018-07-12 DIAGNOSIS — Z0289 Encounter for other administrative examinations: Secondary | ICD-10-CM

## 2018-08-19 ENCOUNTER — Other Ambulatory Visit: Payer: Self-pay | Admitting: Neurology

## 2018-08-19 MED ORDER — OXCARBAZEPINE 600 MG PO TABS: 600.0000 mg | ORAL_TABLET | Freq: Two times a day (BID) | ORAL | 0 refills | Status: DC

## 2018-08-27 ENCOUNTER — Telehealth: Payer: Self-pay

## 2018-08-27 NOTE — Telephone Encounter (Signed)
Please address statin therapy with patient per [facility] gap. Thank you

## 2018-08-29 NOTE — Telephone Encounter (Signed)
Call patient.  Multiple health maintenance gaps need to be addressed.  I have mentioned before about routine vaccination with pneumonia and flu shot.  He declined those.  I had also offered to start a statin since he has diabetes.  He had not consented to any of that.  It still makes sense to get it all done.  If he does not want to do it please document in the chart so at least we can show that we tried.  Thanks.

## 2018-08-30 NOTE — Telephone Encounter (Signed)
Spoke with patient. He will think about it and discuss at his appointment in February. I will note it for [facility] list also. Thank you.

## 2018-08-30 NOTE — Telephone Encounter (Signed)
Noted. Thanks.

## 2018-09-09 ENCOUNTER — Ambulatory Visit: Payer: Medicare HMO | Admitting: Neurology

## 2018-09-09 ENCOUNTER — Encounter: Payer: Self-pay | Admitting: Neurology

## 2018-09-09 VITALS — BP 139/85 | HR 78 | Ht 66.0 in | Wt 222.0 lb

## 2018-09-09 DIAGNOSIS — E119 Type 2 diabetes mellitus without complications: Secondary | ICD-10-CM | POA: Diagnosis not present

## 2018-09-09 DIAGNOSIS — H539 Unspecified visual disturbance: Secondary | ICD-10-CM | POA: Diagnosis not present

## 2018-09-09 DIAGNOSIS — G40201 Localization-related (focal) (partial) symptomatic epilepsy and epileptic syndromes with complex partial seizures, not intractable, with status epilepticus: Secondary | ICD-10-CM | POA: Diagnosis not present

## 2018-09-09 DIAGNOSIS — G802 Spastic hemiplegic cerebral palsy: Secondary | ICD-10-CM | POA: Diagnosis not present

## 2018-09-09 DIAGNOSIS — G808 Other cerebral palsy: Secondary | ICD-10-CM | POA: Diagnosis not present

## 2018-09-09 DIAGNOSIS — G40909 Epilepsy, unspecified, not intractable, without status epilepticus: Secondary | ICD-10-CM | POA: Diagnosis not present

## 2018-09-09 DIAGNOSIS — E11628 Type 2 diabetes mellitus with other skin complications: Secondary | ICD-10-CM

## 2018-09-09 MED ORDER — LEVETIRACETAM 1000 MG PO TABS: ORAL_TABLET | ORAL | 3 refills | Status: DC

## 2018-09-09 MED ORDER — OXCARBAZEPINE 600 MG PO TABS: 600.0000 mg | ORAL_TABLET | Freq: Two times a day (BID) | ORAL | 3 refills | Status: DC

## 2018-09-09 NOTE — Progress Notes (Signed)
Provider:  Larey Seat, MD   Referring Provider: Tonia Ghent, MD Primary Care Physician:  Tonia Ghent, MD    Chief Complaint  Patient presents with  . Follow-up    pt with with, rm 10. pt states will be in need of refills of medication. still has little spells where he zones out. depending on level of stress plays a role in how often then occur. can last 20-30 sec as much as 1-2 min. the ones that last longer tends to give headaches. When they used to track it it was about every 3 wks he would have a series of them.     09-09-2018: RV doing well, no hospitalization, last seizure 5 years ago ( the last grand mal ) , speech seems to have deteriorated. Medication refills needed.    HPI:  Bradley Davila is a 57 y.o. male and seen here as a referral/ revisit  from Dr. Damita Dunnings for a transfer of care. I have seen Mr. 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 In addition he has diabetes and has developed depression, he underwent a cholecystectomy while still being my patient in 2007. In the meantime he had been followed by my former Environmental manager O'Donovan at [facility], but would like to return to the general neurology practice here for reasons of proximity, as well as easier emergency care. Dr. Ginny Forth noted that the patient's seizure disorder has been very well controlled and has actually just refilled medications for the last years without any additional diagnostic studies being undertaken. For this reason I will continue his current medications.   I will try to obtain his paper records form years ago- if they still exist.   Review of Systems: Out of a complete 14 system review, the patient complains of only the following symptoms, and all other reviewed systems are negative.  Last seizure  witnessed 07/08/2013.   Social History   Socioeconomic History  . Marital status: Married    Spouse name: Clarene Critchley  . Number of children: 1  . Years of education: Not on file  . Highest education level: Not on file  Occupational History    Employer: NOT EMPLOYED  Social Needs  . Financial resource strain: Not on file  . Food insecurity:    Worry: Not on file    Inability: Not on file  . Transportation needs:    Medical: Not on file    Non-medical: Not on file  Tobacco Use  . Smoking status: Former Research scientist (life sciences)  . Smokeless tobacco: Never Used  Substance and Sexual Activity  . Alcohol use: Yes    Comment: rarely - 4 to 5 times per year  . Drug use: No  . Sexual activity: Yes  Lifestyle  . Physical activity:    Days per week: Not on file    Minutes per session: Not on file  . Stress: Not on file  Relationships  . Social connections:    Talks on phone: Not on file    Gets together: Not on file    Attends religious service: Not on file    Active member of club or organization: Not on file    Attends meetings of clubs or organizations: Not on file    Relationship status: Not on file  . Intimate  partner violence:    Fear of current or ex partner: Not on file    Emotionally abused: Not on file    Physically abused: Not on file    Forced sexual activity: Not on file  Other Topics Concern  . Not on file  Social History Narrative   Married 2005, 1 daughter   Disabled due to cva    Family History  Problem Relation Age of Onset  . Hydrocephalus Mother   . Liver cancer Father   . Alcohol abuse Father   . Alcohol abuse Sister   . Heart disease Brother   . Colon cancer Neg Hx   . Prostate cancer Neg Hx     Past Medical History:  Diagnosis Date  . Complication of anesthesia    slow to wake up from anesthesia after gall bladder surgery  . 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 Diabetes mellitus without  complication ([place])   . Dysphagia as late effect of stroke   . Headache(784.0)   . Hypertension   . Seizures ([place])    h/o grand mal and staring episodes, followed by [facility] neuro, last grand mal July 09, 2013 last seizure  . 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 Stroke [facility])    childhood    Past Surgical History:  Procedure Laterality Date  . CHOLECYSTECTOMY  2007  . CYSTECTOMY     on buttock, s/p removal  . HERNIA REPAIR    . INSERTION OF MESH N/A 08/05/2017   Procedure: INSERTION OF MESH;  Surgeon: Coralie Keens, MD;  Location: [place];  Service: General;  Laterality: N/A;  . TONSILLECTOMY    . UMBILICAL HERNIA REPAIR  08/05/2017  . UMBILICAL HERNIA REPAIR N/A 08/05/2017   Procedure: UMBILICAL HERNIA REPAIR;  Surgeon: Coralie Keens, MD;  Location: [place];  Service: General;  Laterality: N/A;  . WISDOM TOOTH EXTRACTION      Current Outpatient Medications  Medication Sig Dispense Refill  . acetaminophen (TYLENOL) 325 MG tablet You can take 2 tablets every 6 hours as needed for pain. Use this first then the prescribed pain medication.   DO NOT TAKE MORE THAN 4000 MG OF TYLENOL PER DAY.  IT CAN HARM YOUR LIVER.    Marland Kitchen EPINEPHrine 0.3 mg/0.3 mL IJ SOAJ injection Inject 0.3 mg into the muscle as needed.    . fluocinonide cream (LIDEX) 2.95 % Apply 1 application topically 2 (two) times daily. 30 g 1  . glucose blood (TRUE METRIX BLOOD GLUCOSE TEST) test strip Use as instructed to test blood sugar once daily or as needed.  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 100 each 3  . levETIRAcetam (KEPPRA) 1000 MG tablet Take one in am and 1.5 tab.  in evening by mouth. 225 tablet 0  . loratadine (CLARITIN) 5 MG/5ML syrup Take 2-10 mLs (2-10 mg total) by mouth daily as needed for allergies or rhinitis.    . meloxicam (MOBIC) 15 MG tablet TAKE 1 TABLET EVERY DAY WITH FOOD AS NEEDED FOR PAIN 90 tablet 3  . metFORMIN (GLUCOPHAGE-XR) 500 MG 24 hr tablet Take 2-3 tablets  (1,000-1,500 mg total) by mouth daily.    Marland Kitchen nystatin (NYSTATIN) powder Apply topically 3 (three) times daily. 60 g 2  . oxcarbazepine (TRILEPTAL) 600 MG tablet Take 1 tablet (600 mg total) by mouth 2 (two) times daily. 180 tablet 0  . sitaGLIPtin (JANUVIA) 50  MG tablet Take 0.5-1 tablets (25-50 mg total) by mouth daily. 90 tablet 3  . TRUEPLUS LANCETS 33G MISC Use as instructed to test blood sugar once daily or as needed.  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 100 each 3   No current facility-administered medications for this visit.     Allergies as of 09/09/2018 - Review Complete 09/09/2018  Allergen Reaction Noted  . Benadryl [diphenhydramine hcl]  04/17/2011  . Yellow jacket venom [honey bee venom]  04/17/2011  . Carbamazepine  05/11/2011  . Lamotrigine  05/11/2011  . Metformin and related  10/17/2013  . Phenobarbital  05/11/2011   lisinopril for proteinuria / blood pressure 2.5 mg daily , metformin 1 tablet in the morning 2 tablets at night,  meloxicam 15 mg once a day for pain, only taken with food.   Vitals: BP 139/85   Pulse 78   Ht '5\' 6"'$  (1.676 m)   Wt 222 lb (100.7 kg)   BMI 35.83 kg/m  Last Weight:  Wt Readings from Last 1 Encounters:  09/09/18 222 lb (100.7 kg)   Last Height:   Ht Readings from Last 1 Encounters:  09/09/18 '5\' 6"'$  (1.676 m)    Physical exam:  General: The patient is awake, alert and appears not in acute distress. The patient is cleanly dressed. . Head: Normocephalic, atraumatic. Neck is supple. Mallampati 4, neck circumference: 17.5" . Cardiovascular:  Regular rate and rhythm , without  murmurs or carotid bruit, and without distended neck veins. Respiratory: Lungs are clear to auscultation. Skin:  Without evidence of edema, or rash Trunk: BMI is severely elevated -the patient has a leaning posture. Right sided weakness.    Neurologic exam : The patient is awake and alert, oriented to place and time.  Memory subjective  described as intact.  There is a normal attention span & concentration ability. Speech is fluent with dysarthria, dysphonia but not aphasia. Mood and affect are appropriate.  Cranial nerves: Pupils are equal and briskly reactive to light. Extraocular movements ; the patient's eyes do not track movement in a coordinated fashion, he does have some nystagmus horizontally little eye bobbing with gaze to the right, up toward and downward gaze however seem to be coordinated and conjugate. Visual fields by finger perimetry are intact. Hearing to finger rub intact.  Facial sensation intact to fine touch. Facial motor weakness on the left  tongue and uvula move midline. Tongue protrusion into either cheek is devaited to the right . Shoulder shrug is normal.   Motor exam: Mr. Gwenlyn Found has a increased muscle tone over the hemiparetic right body side, which would be his dominant side.  Sensory:  Fine touch, pinprick and vibration were affected throughout the right body.   Coordination: Rapid alternating movements in the fingers/hands were normal. Finger-to-nose maneuver  normal without evidence of ataxia, dysmetria or tremor.  Gait and station: Patient walks without assistance.  He stumbles easily , right sided weakness.   Deep tendon reflexes: in the upper and lower extremities are spastic hemiparetic.   Assessment:  After physical and neurologic examination, review of laboratory studies, imaging, neurophysiology testing and pre-existing records, assessment is that of :  1)Dysphonia, dysarthria and a tendency to drool affects the left facial side and  mild spasticity affects right body side.  2) Epilepsy remains well controlled on the current medication regimen which I will continue epilepsy is also thought to be a sequela of cerebral palsy. The patient takes oxcarbazepine 600 mg twice daily Keppra  generic levetiracetam 1000 mg 1 tablet in the morning and 1.5 tablets in the evening.  3)  Reports pain behind the left eye. A  feeling of needles behind the eye.  Night time onset more commonly - light sensitive.  I can't appreciate the background very well, he needs to be seen by ophthalmology.     Plan:  Treatment plan and additional workup :  Keppra and Oxcarbazepine to be refilled.  Referral to ophthalmologist - retinal exam for diabetic patient., likes to see someone in [place].     Asencion Partridge Neev Mcmains MD 09/09/2018

## 2018-10-25 ENCOUNTER — Telehealth: Payer: Self-pay

## 2018-10-25 DIAGNOSIS — E118 Type 2 diabetes mellitus with unspecified complications: Principal | ICD-10-CM

## 2018-10-25 DIAGNOSIS — E1165 Type 2 diabetes mellitus with hyperglycemia: Secondary | ICD-10-CM

## 2018-10-25 DIAGNOSIS — IMO0002 Reserved for concepts with insufficient information to code with codable children: Secondary | ICD-10-CM

## 2018-10-25 NOTE — Telephone Encounter (Signed)
CPE labs ordered 

## 2018-10-26 ENCOUNTER — Ambulatory Visit (INDEPENDENT_AMBULATORY_CARE_PROVIDER_SITE_OTHER)
Admission: RE | Admit: 2018-10-26 | Discharge: 2018-10-26 | Disposition: A | Discharge status: Home / Self Care | Payer: Medicare HMO | Source: Ambulatory Visit | Attending: Family Medicine | Admitting: Family Medicine

## 2018-10-26 ENCOUNTER — Ambulatory Visit: Payer: Medicare HMO

## 2018-10-26 ENCOUNTER — Ambulatory Visit (INDEPENDENT_AMBULATORY_CARE_PROVIDER_SITE_OTHER): Payer: Medicare HMO | Admitting: Family Medicine

## 2018-10-26 ENCOUNTER — Ambulatory Visit (INDEPENDENT_AMBULATORY_CARE_PROVIDER_SITE_OTHER): Payer: Medicare HMO

## 2018-10-26 VITALS — BP 146/86 | HR 97 | Temp 97.9°F | Ht 66.5 in | Wt 223.5 lb

## 2018-10-26 DIAGNOSIS — E11628 Type 2 diabetes mellitus with other skin complications: Secondary | ICD-10-CM

## 2018-10-26 DIAGNOSIS — E1165 Type 2 diabetes mellitus with hyperglycemia: Secondary | ICD-10-CM | POA: Diagnosis not present

## 2018-10-26 DIAGNOSIS — M25511 Pain in right shoulder: Secondary | ICD-10-CM

## 2018-10-26 DIAGNOSIS — E65 Localized adiposity: Secondary | ICD-10-CM | POA: Diagnosis not present

## 2018-10-26 DIAGNOSIS — G40309 Generalized idiopathic epilepsy and epileptic syndromes, not intractable, without status epilepticus: Secondary | ICD-10-CM | POA: Diagnosis not present

## 2018-10-26 DIAGNOSIS — E118 Type 2 diabetes mellitus with unspecified complications: Secondary | ICD-10-CM

## 2018-10-26 DIAGNOSIS — R21 Rash and other nonspecific skin eruption: Secondary | ICD-10-CM | POA: Diagnosis not present

## 2018-10-26 DIAGNOSIS — Z0000 Encounter for general adult medical examination without abnormal findings: Secondary | ICD-10-CM | POA: Insufficient documentation

## 2018-10-26 DIAGNOSIS — M79601 Pain in right arm: Secondary | ICD-10-CM

## 2018-10-26 DIAGNOSIS — R4586 Emotional lability: Secondary | ICD-10-CM | POA: Diagnosis not present

## 2018-10-26 DIAGNOSIS — M542 Cervicalgia: Secondary | ICD-10-CM

## 2018-10-26 DIAGNOSIS — Z2820 Immunization not carried out because of patient decision for unspecified reason: Secondary | ICD-10-CM

## 2018-10-26 DIAGNOSIS — H00019 Hordeolum externum unspecified eye, unspecified eyelid: Secondary | ICD-10-CM

## 2018-10-26 DIAGNOSIS — M25519 Pain in unspecified shoulder: Secondary | ICD-10-CM

## 2018-10-26 DIAGNOSIS — M47812 Spondylosis without myelopathy or radiculopathy, cervical region: Secondary | ICD-10-CM | POA: Diagnosis not present

## 2018-10-26 DIAGNOSIS — Z7189 Other specified counseling: Secondary | ICD-10-CM

## 2018-10-26 DIAGNOSIS — IMO0002 Reserved for concepts with insufficient information to code with codable children: Secondary | ICD-10-CM

## 2018-10-26 LAB — COMPREHENSIVE METABOLIC PANEL
ALBUMIN: 4.3 g/dL (ref 3.5–5.2)
ALK PHOS: 61 U/L (ref 39–117)
ALT: 27 U/L (ref 0–53)
AST: 17 U/L (ref 0–37)
BUN: 13 mg/dL (ref 6–23)
CALCIUM: 9.2 mg/dL (ref 8.4–10.5)
CO2: 27 mEq/L (ref 19–32)
Chloride: 100 mEq/L (ref 96–112)
Creatinine, Ser: 0.97 mg/dL (ref 0.40–1.50)
GFR: 79.23 mL/min (ref >60.00)
Glucose, Bld: 196 mg/dL — ABNORMAL HIGH (ref 70–99)
POTASSIUM: 4.4 meq/L (ref 3.5–5.1)
Sodium: 136 mEq/L (ref 135–145)
TOTAL PROTEIN: 7.6 g/dL (ref 6.0–8.3)
Total Bilirubin: 0.4 mg/dL (ref 0.2–1.2)

## 2018-10-26 LAB — MICROALBUMIN / CREATININE URINE RATIO
Creatinine,U: 241.4 mg/dL
Microalb Creat Ratio: 23.9 mg/g (ref 0.0–30.0)
Microalb, Ur: 57.8 mg/dL — ABNORMAL HIGH (ref 0.0–1.9)

## 2018-10-26 LAB — LIPID PANEL
Cholesterol: 234 mg/dL — ABNORMAL HIGH (ref 0–200)
HDL: 39.6 mg/dL
NonHDL: 194.28
Total CHOL/HDL Ratio: 6
Triglycerides: 343 mg/dL — ABNORMAL HIGH (ref 0.0–149.0)
VLDL: 68.6 mg/dL — ABNORMAL HIGH (ref 0.0–40.0)

## 2018-10-26 LAB — HEMOGLOBIN A1C: Hgb A1c MFr Bld: 8.5 % — ABNORMAL HIGH (ref 4.6–6.5)

## 2018-10-26 LAB — LDL CHOLESTEROL, DIRECT: Direct LDL: 171 mg/dL

## 2018-10-26 MED ORDER — GLUCOSE BLOOD VI STRP: ORAL_STRIP | 3 refills | Status: DC

## 2018-10-26 MED ORDER — NYSTATIN 100000 UNIT/GM EX POWD: Freq: Three times a day (TID) | CUTANEOUS | 2 refills | Status: DC

## 2018-10-26 MED ORDER — METFORMIN HCL ER 500 MG PO TB24: 2000.0000 mg | ORAL_TABLET | Freq: Every day | ORAL | Status: DC

## 2018-10-26 MED ORDER — TRUEPLUS LANCETS 33G MISC: 3 refills | Status: DC

## 2018-10-26 NOTE — Progress Notes (Signed)
Diabetes:  Using medications without difficulties: off januvia, taking 4 metformin a day.  He had diarrhea when taking metfromin with sugary soda, he cut back on the soda and diarrhea got better.   Hypoglycemic episodes: no Hyperglycemic episodes: no Feet problems: no tingling.   Blood Sugars averaging: ~180 usually.   eye exam within last year: He has eye clinic pending. Prev with L eye pain but not now.  He has mult styes noted, B but R>L.   Labs pending.  See notes on labs.   He had trouble swallowing [place] since it was so small.  He doesn't have trouble with metformin size.    Mood d/w pt.   His mood is worse this time of year.  His uncle and his dog died.  His aunt is in poor health.  He can't drive and that causes some social isolation.  No SI/HI.  Listening to music helps (he likes Elvis).  He agreed to update me as needed.    SZ.  Per neurology.  Compliant with meds.  No gran mal events since 2014 but still with occ petit staring spells.  He isn't driving.    Health maintenance Tetanus declined  Flu declined PNA declined.  Shingles out of stock.  Prostate cancer screening and PSA options (with potential risks and benefits of testing vs not testing) were discussed along with recent recs/guidelines.  He declined testing PSA at this point. Colonoscopy 2015.  I will defer to patient about f/u with GI.  It may be reasonable to consider cologuard if patient didn't want to go through with colonoscopy but I'll defer to patient and GI.   Living will d/w pt.  Wife designated if patient were incapacitated.   Diet and exercise d/w pt.    I think he understands the rationale for vaccination, and he does not have a medical reason not to get vaccinated.  He just declines vaccination.  Fatty areas in the axilla noted.  He wanted this evaluated.  More prominent on the right than the left.  Not painful.  Right arm with pain with overhead movement.  He also has pain that can radiate down to the  right hand.  No trauma.  He continues to have difficulty with superficial fungal infections and needed a refill on his nystatin.  PMH and SH reviewed  Meds, vitals, and allergies reviewed.   ROS: Per HPI unless specifically indicated in ROS section   GEN: nad, alert and oriented, speech at baseline.   HEENT: mucous membranes moist, multiple styes noted NECK: supple w/o LA CV: rrr. PULM: ctab, no inc wob ABD: soft, +bs EXT: no edema SKIN: Well-perfused Fatty deposits noted in the axilla bilaterally, right more than left.  No lymphadenopathy noted in the axilla bilaterally. He has pain with overhead movement of the right shoulder.  Pain with internal and external rotation.  The shoulder discomfort is separate from the pain that he can have radiate down to his hand, per his report.  Diabetic foot exam: Normal inspection No skin breakdown No calluses  Normal DP pulses Normal sensation to light touch and monofilament Nails normal

## 2018-10-26 NOTE — Patient Instructions (Signed)
Bradley Davila , Thank you for taking time to come for your Medicare Wellness Visit. I appreciate your ongoing commitment to your health goals. Please review the following plan we discussed and let me know if I can assist you in the future.   These are the goals we discussed: Goals    . DIET - INCREASE WATER INTAKE     Starting 10/26/2018, I will attempt to drink at least 6 glasses of water daily.        This is a list of the screening recommended for you and due dates:  Health Maintenance  Topic Date Due  . Flu Shot  12/14/2018*  . Eye exam for diabetics  12/14/2018*  . Pneumococcal vaccine  09/14/2020*  . Hemoglobin A1C  04/26/2019  . Colon Cancer Screening  06/17/2019  . Complete foot exam   10/27/2019  . Urine Protein Check  10/27/2019  . Tetanus Vaccine  06/09/2020  .  Hepatitis C: One time screening is recommended by Center for Disease Control  (CDC) for  adults born from 18 through 1966.   Completed  . HIV Screening  Completed  *Topic was postponed. The date shown is not the original due date.   Preventive Care for Adults  A healthy lifestyle and preventive care can promote health and wellness. Preventive health guidelines for adults include the following key practices.  . A routine yearly physical is a good way to check with your health care provider about your health and preventive screening. It is a chance to share any concerns and updates on your health and to receive a thorough exam.  . Visit your dentist for a routine exam and preventive care every 6 months. Brush your teeth twice a day and floss once a day. Good oral hygiene prevents tooth decay and gum disease.  . The frequency of eye exams is based on your age, health, family medical history, use  of contact lenses, and other factors. Follow your health care provider's recommendations for frequency of eye exams.  . Eat a healthy diet. Foods like vegetables, fruits, whole grains, low-fat dairy products, and lean  protein foods contain the nutrients you need without too many calories. Decrease your intake of foods high in solid fats, added sugars, and salt. Eat the right amount of calories for you. Get information about a proper diet from your health care provider, if necessary.  . Regular physical exercise is one of the most important things you can do for your health. Most adults should get at least 150 minutes of moderate-intensity exercise (any activity that increases your heart rate and causes you to sweat) each week. In addition, most adults need muscle-strengthening exercises on 2 or more days a week.  Silver Sneakers may be a benefit available to you. To determine eligibility, you may visit the website: www.silversneakers.com or contact program at 219 274 8084 Mon-Fri between 8AM-8PM.   . Maintain a healthy weight. The body mass index (BMI) is a screening tool to identify possible weight problems. It provides an estimate of body fat based on height and weight. Your health care provider can find your BMI and can help you achieve or maintain a healthy weight.   For adults 20 years and older: ? A BMI below 18.5 is considered underweight. ? A BMI of 18.5 to 24.9 is normal. ? A BMI of 25 to 29.9 is considered overweight. ? A BMI of 30 and above is considered obese.   . Maintain normal blood lipids and cholesterol levels  by exercising and minimizing your intake of saturated fat. Eat a balanced diet with plenty of fruit and vegetables. Blood tests for lipids and cholesterol should begin at age 72 and be repeated every 5 years. If your lipid or cholesterol levels are high, you are over 50, or you are at high risk for heart disease, you may need your cholesterol levels checked more frequently. Ongoing high lipid and cholesterol levels should be treated with medicines if diet and exercise are not working.  . If you smoke, find out from your health care provider how to quit. If you do not use tobacco, please  do not start.  . If you choose to drink alcohol, please do not consume more than 2 drinks per day. One drink is considered to be 12 ounces (355 mL) of beer, 5 ounces (148 mL) of wine, or 1.5 ounces (44 mL) of liquor.  . If you are 79-60 years old, ask your health care provider if you should take aspirin to prevent strokes.  . Use sunscreen. Apply sunscreen liberally and repeatedly throughout the day. You should seek shade when your shadow is shorter than you. Protect yourself by wearing long sleeves, pants, a wide-brimmed hat, and sunglasses year round, whenever you are outdoors.  . Once a month, do a whole body skin exam, using a mirror to look at the skin on your back. Tell your health care provider of new moles, moles that have irregular borders, moles that are larger than a pencil eraser, or moles that have changed in shape or color.

## 2018-10-26 NOTE — Progress Notes (Signed)
PCP notes:   Health maintenance:  Foot exam - PCP follow-up needed Patient declined PNA and Flu vaccines.  A1C - completed Eye exam - future appt scheduled Nov 09, 2018  Abnormal screenings:   Hearing - failed  Hearing Screening   125Hz  250Hz  500Hz  1000Hz  2000Hz  3000Hz  4000Hz  6000Hz  8000Hz   Right ear:   40 40 40  0    Left ear:   40 40 40  0     Depression score: 8 Depression screen [facility] 2/9 10/26/2018 10/16/2017 10/05/2013  Decreased Interest 1 1 0  Down, Depressed, Hopeless 2 1 0  PHQ - 2 Score 3 2 0  Altered sleeping 0 1 -  Tired, decreased energy 2 1 -  Change in appetite 0 0 -  Feeling bad or failure about yourself  2 3 -  Trouble concentrating 1 0 -  Moving slowly or fidgety/restless 0 3 -  Suicidal thoughts 0 0 -  PHQ-9 Score 8 10 -  Difficult doing work/chores Not difficult at all Somewhat difficult -    Mini-Cog score: 19/20 MMSE - Mini Mental State Exam 10/26/2018 10/16/2017  Orientation to time 5 5  Orientation to Place 5 5  Registration 3 3  Attention/ Calculation 0 0  Recall 2 3  Recall-comments unable to recall 1 of 3 words -  Language- name 2 objects 0 0  Language- repeat 1 1  Language- follow 3 step command 3 3  Language- read & follow direction 0 0  Write a sentence 0 0  Copy design 0 0  Total score 19 20    Patient concerns:   Possible yeast infection in groin  Multiple styes to bilateral eyes  Nurse concerns:   Next PCP appt:   I reviewed health advisor's note, was available for consultation on the day of service listed in this note, and agree with documentation and plan. Elsie Stain, MD.

## 2018-10-26 NOTE — Patient Instructions (Addendum)
Use warm compresses and see the eye clinic.   I'll await the eye clinic appointment.   Go to the lab on the way out.  We'll contact you with your lab and xray report. Take care.  Glad to see you.   We make arrangements for referrals, extra imaging, and other appointments based on the urgency of the situation. Referrals are handled based on the clinical situation, not in the order that they are placed. If you do not see one of our referral coordinators on the way out of the clinic today, then you should expect a call in the next 1 to 2 weeks. We work diligently to process all referrals as quickly as possible.

## 2018-10-26 NOTE — Progress Notes (Signed)
Subjective:   Bradley Davila is a 58 y.o. male who presents for Medicare Annual/Subsequent preventive examination.  Review of Systems:  N/A Cardiac Risk Factors include: advanced age (>58mn, >>57women);diabetes mellitus;obesity (BMI >30kg/m2);male gender     Objective:    Vitals: BP (!) 146/86 (BP Location: Left Arm, Patient Position: Sitting, Cuff Size: Normal)   Pulse 97   Temp 97.9 F (36.6 C) (Oral)   Ht 5' 6.5" (1.689 m) Comment: no shoes  Wt 223 lb 8 oz (101.4 kg)   SpO2 95%   BMI 35.53 kg/m   Body mass index is 35.53 kg/m.  Advanced Directives 10/26/2018 10/16/2017 08/05/2017 08/03/2017 06/07/2014  Does Patient Have a Medical Advance Directive? _0   Does patient want to make changes to medical advance directive? - - - No - Patient declined -  Would patient like information on creating a medical advance directive? Yes (MAU/Ambulatory/Procedural Areas - Information given) No - Patient declined No - Patient declined - No - patient declined information    Tobacco Social History   Tobacco Use  Smoking Status Former Smoker  Smokeless Tobacco Never Used     Counseling given: No   Clinical Intake:  Pre-visit preparation completed: Yes  Pain : 0-10 Pain Score: 8  Pain Type: Acute pain Pain Location: Head     Nutritional Status: BMI > 30  Obese Nutritional Risks: None Diabetes: No  How often do you need to have someone help you when you read instructions, pamphlets, or other written materials from your doctor or pharmacy?: 1 - Never What is the last grade level you completed in school?: 12th grade  Interpreter Needed?: No  Comments: pt lives with spouse Information entered by :: LPinson, LPN  Past Medical History:  Diagnosis Date  . Complication of anesthesia    slow to wake up from anesthesia after gall bladder surgery  . 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 [place]  [place]   . Dysphagia as late effect of stroke   . Headache(784.0)   . Hypertension   . [place]    h[facility] grand mal and staring episodes, [facility] last grand mal July 09, 2013 last seizure  . 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 Stroke ([facility]    childhood   Past Surgical History:  Procedure Laterality Date  . CHOLECYSTECTOMY  2007  . CYSTECTOMY     on buttock, s/p removal  . HERNIA REPAIR    . INSERTION OF MESH N/A 08/05/2017   Procedure: INSERTION OF MESH;  Surgeon: BCoralie Keens MD;  [place]  Service: General;  Laterality: N/A;  . TONSILLECTOMY    . UMBILICAL HERNIA REPAIR  08/05/2017  . UMBILICAL HERNIA REPAIR N/A 08/05/2017   Procedure: UMBILICAL HERNIA REPAIR;  Surgeon: BCoralie Keens MD;  [place]  Service: General;  Laterality: N/A;  . WISDOM TOOTH EXTRACTION     Family History  Problem Relation Age of Onset  . Hydrocephalus Mother   . Liver cancer Father   . Alcohol abuse Father   . Alcohol abuse Sister   . Heart disease Brother   . Colon cancer Neg Hx   . Prostate cancer Neg Hx    Social History   Socioeconomic History  . Marital status: Married    Spouse name: TClarene Critchley . Number of children: 1  .  Years of education: Not on file  . Highest education level: Not on file  Occupational History    Employer: NOT EMPLOYED  Social Needs  . Financial resource strain: Not on file  . Food insecurity:    Worry: Not on file    Inability: Not on file  . Transportation needs:    Medical: Not on file    Non-medical: Not on file  Tobacco Use  . Smoking status: Former Research scientist (life sciences)  . Smokeless tobacco: Never Used  Substance and Sexual Activity  . Alcohol use: Yes    Comment: rarely - 4 to 5 times per year  . Drug use: No  . Sexual activity: Yes  Lifestyle  . Physical activity:    Days per week: Not on file    Minutes per session: Not on file  . Stress: Not on file  Relationships  .  Social connections:    Talks on phone: Not on file    Gets together: Not on file    Attends religious service: Not on file    Active member of club or organization: Not on file    Attends meetings of clubs or organizations: Not on file    Relationship status: Not on file  Other Topics Concern  . Not on file  Social History Narrative   Married 2005, 1 daughter   Disabled due to cva    Outpatient Encounter Medications as of 10/26/2018  Medication Sig  . acetaminophen (TYLENOL) 325 MG tablet You can take 2 tablets every 6 hours as needed for pain. Use this first then the prescribed pain medication.   DO NOT TAKE MORE THAN 4000 MG OF TYLENOL PER DAY.  IT CAN HARM YOUR LIVER.  Marland Kitchen EPINEPHrine 0.3 mg/0.3 mL IJ SOAJ injection Inject 0.3 mg into the muscle as needed.  . fluocinonide cream (LIDEX) 0.81 % Apply 1 application topically 2 (two) times daily.  Marland Kitchen levETIRAcetam (KEPPRA) 1000 MG tablet Take one Tab in AM and 1.5 Tab. in PM by mouth.  . loratadine (CLARITIN) 5 MG/5ML syrup Take 2-10 mLs (2-10 mg total) by mouth daily as needed for allergies or rhinitis.  . meloxicam (MOBIC) 15 MG tablet TAKE 1 TABLET EVERY DAY WITH FOOD AS NEEDED FOR PAIN  . oxcarbazepine (TRILEPTAL) 600 MG tablet Take 1 tablet (600 mg total) by mouth 2 (two) times daily.  . [DISCONTINUED] glucose blood (TRUE METRIX BLOOD GLUCOSE TEST) test strip Use as instructed to test blood sugar once daily or as needed.  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  . [DISCONTINUED] metFORMIN (GLUCOPHAGE-XR) 500 MG 24 hr tablet Take 2-3 tablets (1,000-1,500 mg total) by mouth daily.  . [DISCONTINUED] nystatin (NYSTATIN) powder Apply topically 3 (three) times daily.  . [DISCONTINUED] sitaGLIPtin (JANUVIA) 50 MG tablet Take 0.5-1 tablets (25-50 mg total) by mouth daily. (Patient not taking: Reported on 10/26/2018)  . [DISCONTINUED] TRUEPLUS LANCETS 33G MISC Use as instructed to test blood sugar once daily or as needed.  Diagnosis:  E11.65   Non insulin dependent.   No facility-administered encounter medications on file as of 10/26/2018.     Activities of Daily Living In your present state of health, do you have any difficulty performing the following activities: 10/26/2018  Hearing? Y  Vision? Y  Difficulty concentrating or making decisions? Y  Walking or climbing stairs? N  Dressing or bathing? N  Doing errands, shopping? Y  Preparing Food and eating ? N  Using the Toilet? N  In the past six  months, have you accidently leaked urine? Y  Do you have problems with loss of bowel control? Y  Managing your Medications? N  Managing your Finances? N  Housekeeping or managing your Housekeeping? N  Some recent data might be hidden    Patient Care Team: Tonia Ghent, MD as PCP - General (Family Medicine)   Assessment:   This is a routine wellness examination for [facility].   Hearing Screening   _0  _1  _2  _3  _4  _5  _6  _7  _8   Right ear:   40 40 40  0    Left ear:   40 40 40  0    Vision Screening Comments: Future appt scheduled Nov 09, 2018    Exercise Activities and Dietary recommendations Current Exercise Habits: The patient does not participate in regular exercise at present, Exercise limited by: None identified  Goals    . DIET - INCREASE WATER INTAKE     Starting 10/26/2018, I will attempt to drink at least 6 glasses of water daily.        Fall Risk Fall Risk  10/26/2018 10/16/2017 10/05/2013  Falls in the past year? 0 No No    Depression Screen PHQ 2/9 Scores 10/26/2018 10/16/2017 10/05/2013  PHQ - 2 Score 3 2 0  PHQ- 9 Score 8 10 -    Cognitive Function MMSE - Mini Mental State Exam 10/26/2018 10/16/2017  Orientation to time 5 5  Orientation to Place 5 5  Registration 3 3  Attention/ Calculation 0 0  Recall 2 3  Recall-comments unable to recall 1 of 3 words -  Language- name 2 objects 0 0  Language- repeat 1 1  Language- follow 3 step command 3 3  Language- read & follow  direction 0 0  Write a sentence 0 0  Copy design 0 0  Total score 19 20     PLEASE NOTE: A Mini-Cog screen was completed. Maximum score is 20. A value of 0 denotes this part of Folstein MMSE was not completed or the patient failed this part of the Mini-Cog screening.   Mini-Cog Screening Orientation to Time - Max 5 pts Orientation to Place - Max 5 pts Registration - Max 3 pts Recall - Max 3 pts Language Repeat - Max 1 pts Language Follow 3 Step Command - Max 3 pts   Screening Tests Health Maintenance  Topic Date Due  . INFLUENZA VACCINE  12/14/2018 (Originally 04/15/2018)  . OPHTHALMOLOGY EXAM  12/14/2018 (Originally 09/15/2015)  . PNEUMOCOCCAL POLYSACCHARIDE VACCINE AGE 26-64 HIGH RISK  09/14/2020 (Originally 11/08/1962)  . HEMOGLOBIN A1C  04/26/2019  . COLONOSCOPY  06/17/2019  . FOOT EXAM  10/27/2019  . URINE MICROALBUMIN  10/27/2019  . TETANUS/TDAP  06/09/2020  . Hepatitis C Screening  Completed  . HIV Screening  Completed       Plan:     I have personally reviewed, addressed, and noted the following in the patient's chart:  A. Medical and social history B. Use of alcohol, tobacco or illicit drugs  C. Current medications and supplements D. Functional ability and status E.  Nutritional status F.  Physical activity G. Advance directives H. List of other physicians I.  Hospitalizations, surgeries, and ER visits in previous 12 months J.  [place] to include hearing, vision, cognitive, depression L. Referrals and appointments - none  In addition, I have reviewed and discussed with patient certain preventive protocols, quality metrics, and best practice recommendations. A written personalized care plan for preventive services as  well as general preventive health recommendations were provided to patient.  See attached scanned questionnaire for additional information.   Signed,   Lindell Noe, MHA, BS, LPN Health Coach

## 2018-10-28 ENCOUNTER — Encounter: Payer: Self-pay | Admitting: *Deleted

## 2018-10-28 DIAGNOSIS — M25519 Pain in unspecified shoulder: Secondary | ICD-10-CM | POA: Insufficient documentation

## 2018-10-28 DIAGNOSIS — E65 Localized adiposity: Secondary | ICD-10-CM | POA: Insufficient documentation

## 2018-10-28 DIAGNOSIS — Z7189 Other specified counseling: Secondary | ICD-10-CM | POA: Insufficient documentation

## 2018-10-28 DIAGNOSIS — R4586 Emotional lability: Secondary | ICD-10-CM | POA: Insufficient documentation

## 2018-10-28 DIAGNOSIS — H00019 Hordeolum externum unspecified eye, unspecified eyelid: Secondary | ICD-10-CM | POA: Insufficient documentation

## 2018-10-28 DIAGNOSIS — Z2820 Immunization not carried out because of patient decision for unspecified reason: Secondary | ICD-10-CM | POA: Insufficient documentation

## 2018-10-28 MED ORDER — DULAGLUTIDE 0.75 MG/0.5ML ~~LOC~~ SOAJ: SUBCUTANEOUS | Status: DC

## 2018-10-28 NOTE — Assessment & Plan Note (Signed)
Living will d/w pt.  Wife designated if patient were incapacitated.   ?

## 2018-10-28 NOTE — Assessment & Plan Note (Signed)
I think he understands the rationale for vaccination, and he does not have a medical reason not to get vaccinated.  He just declines vaccination.

## 2018-10-28 NOTE — Assessment & Plan Note (Signed)
I think he has to potentially separate issues, radicular pain radiating down to the right arm and also rotator cuff irritation causing shoulder pain.  Reasonable to check neck films today.  Refer to orthopedics.  Discussed with patient.  He agrees.

## 2018-10-28 NOTE — Assessment & Plan Note (Signed)
Tetanus declined  Flu declined PNA declined.  Shingles out of stock.  Prostate cancer screening and PSA options (with potential risks and benefits of testing vs not testing) were discussed along with recent recs/guidelines.  He declined testing PSA at this point. Colonoscopy 2015.  I will defer to patient about f/u with GI.  It may be reasonable to consider cologuard if patient didn't want to go through with colonoscopy but I'll defer to patient and GI.   Living will d/w pt.  Wife designated if patient were incapacitated.   Diet and exercise d/w pt.

## 2018-10-28 NOTE — Assessment & Plan Note (Addendum)
See notes on labs.  Declined routine vaccination.  He was asking about using Trulicity.  I want to consider options.  Discussed diet.  >40 minutes spent in face to face time with patient, >50% spent in counselling or coordination of care, discussing diabetes, shoulder pain, arm pain, rash, etc.

## 2018-10-28 NOTE — Assessment & Plan Note (Signed)
Per neurology.  Compliant with meds.  No gran mal events since 2014 but still with occ petit staring spells.  He isn't driving.

## 2018-10-28 NOTE — Assessment & Plan Note (Signed)
Continue nystatin °

## 2018-10-28 NOTE — Assessment & Plan Note (Signed)
Advised warm compresses and follow-up with eye clinic.

## 2018-10-28 NOTE — Assessment & Plan Note (Signed)
This looks like bilateral but slightly asymmetric fatty deposits in the axilla but would not need acute intervention otherwise.  Discussed with patient.

## 2018-10-28 NOTE — Assessment & Plan Note (Signed)
His mood is worse this time of year.  His uncle and his dog died.  His aunt is in poor health.  He can't drive and that causes some social isolation.  No SI/HI.  Listening to music helps (he likes Elvis).  He did not want treatment at this point.  He agreed to update me as needed.

## 2018-11-05 ENCOUNTER — Encounter: Payer: Self-pay | Admitting: Family Medicine

## 2018-11-05 ENCOUNTER — Telehealth: Payer: Self-pay | Admitting: Family Medicine

## 2018-11-05 ENCOUNTER — Encounter: Payer: Self-pay | Admitting: *Deleted

## 2018-11-05 NOTE — Telephone Encounter (Signed)
Pt's wife called due to the letter they received about the pt's cholesterol. They want to know different brands for the medications so that they can price them at the pharmacy. Pt is requesting a call back.

## 2018-11-08 ENCOUNTER — Encounter: Payer: Self-pay | Admitting: *Deleted

## 2018-11-08 NOTE — Telephone Encounter (Signed)
Atorvastatin or pravastatin.  Thanks.

## 2018-11-08 NOTE — Telephone Encounter (Signed)
Sent via MyChart at patient's wife's request.

## 2018-11-09 DIAGNOSIS — E119 Type 2 diabetes mellitus without complications: Secondary | ICD-10-CM | POA: Diagnosis not present

## 2018-11-09 DIAGNOSIS — H40003 Preglaucoma, unspecified, bilateral: Secondary | ICD-10-CM | POA: Diagnosis not present

## 2018-11-09 LAB — HM DIABETES EYE EXAM

## 2018-11-10 ENCOUNTER — Encounter: Payer: Self-pay | Admitting: Family Medicine

## 2018-11-10 ENCOUNTER — Other Ambulatory Visit: Payer: Self-pay | Admitting: Family Medicine

## 2018-11-10 MED ORDER — INSULIN GLARGINE 100 UNIT/ML SOLOSTAR PEN: 5.0000 [IU] | PEN_INJECTOR | Freq: Every day | SUBCUTANEOUS | 99 refills | Status: DC

## 2018-11-10 MED ORDER — ATORVASTATIN CALCIUM 20 MG PO TABS: 20.0000 mg | ORAL_TABLET | Freq: Every day | ORAL | 3 refills | Status: DC

## 2018-11-10 MED ORDER — PEN NEEDLES 30G X 5 MM MISC: 1.0000 | Freq: Every day | 3 refills | Status: DC

## 2018-11-12 ENCOUNTER — Telehealth: Payer: Self-pay | Admitting: Family Medicine

## 2018-11-12 NOTE — Telephone Encounter (Addendum)
Is this in regards to atorvastatin vs pravastatin? Pt had responded with atorvastatin. It was sent in 11-10-18.

## 2018-11-12 NOTE — Telephone Encounter (Signed)
He had another message in the meantime that I didn't follow, from 2/21.  It may have been about the statin cost, so it may have already been addressed.

## 2018-11-12 NOTE — Telephone Encounter (Signed)
See my chart message.  Please try to call his wife for clarification.  Thanks.

## 2018-11-14 ENCOUNTER — Other Ambulatory Visit: Payer: Self-pay | Admitting: Family Medicine

## 2018-11-17 ENCOUNTER — Encounter: Payer: Self-pay | Admitting: Adult Health

## 2018-11-23 ENCOUNTER — Encounter: Payer: Self-pay | Admitting: Family Medicine

## 2018-11-29 ENCOUNTER — Other Ambulatory Visit: Payer: Self-pay | Admitting: Family Medicine

## 2019-01-19 DIAGNOSIS — H40003 Preglaucoma, unspecified, bilateral: Secondary | ICD-10-CM | POA: Diagnosis not present

## 2019-04-04 ENCOUNTER — Other Ambulatory Visit: Payer: Self-pay

## 2019-04-04 ENCOUNTER — Ambulatory Visit (INDEPENDENT_AMBULATORY_CARE_PROVIDER_SITE_OTHER): Payer: Medicare HMO | Admitting: Family Medicine

## 2019-04-04 ENCOUNTER — Encounter: Payer: Self-pay | Admitting: Family Medicine

## 2019-04-04 VITALS — BP 126/70 | HR 86 | Temp 97.7°F | Ht 66.5 in | Wt 219.6 lb

## 2019-04-04 DIAGNOSIS — E118 Type 2 diabetes mellitus with unspecified complications: Secondary | ICD-10-CM

## 2019-04-04 DIAGNOSIS — E1165 Type 2 diabetes mellitus with hyperglycemia: Secondary | ICD-10-CM | POA: Diagnosis not present

## 2019-04-04 DIAGNOSIS — H6120 Impacted cerumen, unspecified ear: Secondary | ICD-10-CM

## 2019-04-04 DIAGNOSIS — IMO0002 Reserved for concepts with insufficient information to code with codable children: Secondary | ICD-10-CM

## 2019-04-04 LAB — POCT GLYCOSYLATED HEMOGLOBIN (HGB A1C): Hemoglobin A1C: 7.2 % — AB (ref 4.0–5.6)

## 2019-04-04 MED ORDER — LANTUS SOLOSTAR 100 UNIT/ML ~~LOC~~ SOPN: 16.0000 [IU] | PEN_INJECTOR | Freq: Every day | SUBCUTANEOUS | Status: DC

## 2019-04-04 MED ORDER — METFORMIN HCL ER 500 MG PO TB24: ORAL_TABLET | ORAL | Status: DC

## 2019-04-04 MED ORDER — GLUCOSE BLOOD VI STRP: ORAL_STRIP | 3 refills | Status: DC

## 2019-04-04 NOTE — Patient Instructions (Addendum)
Check to see if you are taking atorvastatin.  Either way, let me know.   Use debrox for now and irrigate as needed.  Recheck in about 3 months, after 07/05/2019.   We can do labs at the visit.  Take care.  Glad to see you.

## 2019-04-04 NOTE — Progress Notes (Signed)
Diabetes:  Using medications without difficulties: he is usually taking 4 pills a day.   Hypoglycemic episodes: no Hyperglycemic episodes: no Feet problems:minimal tingling.  Foot care d/w pt.   Blood Sugars averaging: usually ~120s.   eye exam within last year: yes Taking 16-20 units of insulin.  Depending on sugar.   A1c clearly better at 7.2.    Routine covid considerations d/w pt.  vaccinations declined.   Unclear if patient is on statin, see avs.    He thought he had wax in his ears with hearing affected.    Meds, vitals, and allergies reviewed.   ROS: Per HPI unless specifically indicated in ROS section   GEN: nad, alert and oriented, speech at baseline.  HEENT: ncat, bilateral ear canal wax noted. NECK: supple w/o LA CV: rrr. PULM: ctab, no inc wob ABD: soft, +bs EXT: no edema SKIN:well perfused.

## 2019-04-06 NOTE — Assessment & Plan Note (Signed)
R ear canal cleaned with irrigation and curette.  L canal with some removed with irrigation, but stopped due to mild discomfort.  D/w pt about home debrox use and then irrigation.  He will update me as needed.

## 2019-04-06 NOTE — Assessment & Plan Note (Signed)
A1c clearly better at 7.2.  Continue insulin for now.  If his sugar is lower with continued work on diet he can taper his insulin.  If he is having sugar elevations he can increase his dose by 1 unit/day.  Rationale discussed with patient.  Recheck in a few months.  I thanked him for his effort.  He is going to check to see if he still taking a statin.  See after visit summary. >25 minutes spent in face to face time with patient, >50% spent in counselling or coordination of care.

## 2019-04-13 ENCOUNTER — Ambulatory Visit: Payer: Medicare HMO | Admitting: Adult Health

## 2019-04-29 ENCOUNTER — Encounter: Payer: Self-pay | Admitting: Family Medicine

## 2019-04-29 ENCOUNTER — Other Ambulatory Visit: Payer: Self-pay

## 2019-04-29 ENCOUNTER — Ambulatory Visit (INDEPENDENT_AMBULATORY_CARE_PROVIDER_SITE_OTHER): Payer: Medicare HMO | Admitting: Family Medicine

## 2019-04-29 ENCOUNTER — Ambulatory Visit (INDEPENDENT_AMBULATORY_CARE_PROVIDER_SITE_OTHER)
Admission: RE | Admit: 2019-04-29 | Discharge: 2019-04-29 | Disposition: A | Discharge status: Home / Self Care | Payer: Medicare HMO | Source: Ambulatory Visit | Attending: Family Medicine | Admitting: Family Medicine

## 2019-04-29 VITALS — BP 112/68 | HR 69 | Temp 97.2°F | Ht 66.5 in | Wt 216.1 lb

## 2019-04-29 DIAGNOSIS — IMO0002 Reserved for concepts with insufficient information to code with codable children: Secondary | ICD-10-CM

## 2019-04-29 DIAGNOSIS — E118 Type 2 diabetes mellitus with unspecified complications: Secondary | ICD-10-CM

## 2019-04-29 DIAGNOSIS — M79674 Pain in right toe(s): Secondary | ICD-10-CM | POA: Diagnosis not present

## 2019-04-29 DIAGNOSIS — E1165 Type 2 diabetes mellitus with hyperglycemia: Secondary | ICD-10-CM | POA: Diagnosis not present

## 2019-04-29 DIAGNOSIS — S99921A Unspecified injury of right foot, initial encounter: Secondary | ICD-10-CM | POA: Diagnosis not present

## 2019-04-29 DIAGNOSIS — M79671 Pain in right foot: Secondary | ICD-10-CM

## 2019-04-29 NOTE — Patient Instructions (Addendum)
Try icing and buddy taping.  If not better, then try a hard soled post op shoe for longer.  Update me as needed.   Take care.  Glad to see you.

## 2019-04-29 NOTE — Progress Notes (Signed)
Foot pain.  Going on for about 3 weeks.  He accidentally kicked a night stand.  Tried soaking w/o relief.  R5th toe pain with pain up the R 5th ray.  More pain walking.    DM. He is okay with 4 metformin a day, occ needing to drop to 3 a day.  89 this AM.  He is working on diet.    Meds, vitals, and allergies reviewed.   ROS: Per HPI unless specifically indicated in ROS section   nad Speech at baseline R foot with Normal DP pulse.   R foot not bruised or puffy.  Not tender to palpation on the medial or lateral malleoli.  Achilles not tender.  Midfoot not tender except for along the fifth ray. R5th ray distally ttp, along with R5th toe.   Normal capillary refill on the toe and normal range of motion.  Xray reviewed. D/w pt.   Not sig better in post op shoe, he tried it on at [place].   Able to bear weight with and without post op shoe.

## 2019-05-01 DIAGNOSIS — M79671 Pain in right foot: Secondary | ICD-10-CM | POA: Insufficient documentation

## 2019-05-01 NOTE — Assessment & Plan Note (Addendum)
Xray reviewed. D/w pt.   Not sig better in post op shoe, he tried it on at [place].   Able to bear weight with and without post op shoe.  He does not have severe symptoms.  Unlikely that he has a significant occult fracture at this point.  Discussed options Try icing and buddy taping.  If not better, then try a hard soled post op shoe for longer.  Update me as needed.  We can perform follow-up imaging if needed, if he has worsening symptoms.  He agrees. >25 minutes spent in face to face time with patient, >50% spent in counselling or coordination of care

## 2019-05-01 NOTE — Assessment & Plan Note (Signed)
He is generally okay with 4 metformin a day, occ needing to drop to 3 a day.  89 this AM.  He is working on diet.   Continue as is for now.

## 2019-06-10 ENCOUNTER — Other Ambulatory Visit: Payer: Self-pay | Admitting: *Deleted
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
## 2019-06-14 ENCOUNTER — Other Ambulatory Visit: Payer: Self-pay

## 2019-06-14 NOTE — Patient Outreach (Addendum)
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  HIPAA identifiers verified.   Subjective:  Patient agreeable to review medications.  Patient has speech difficulty and is hard to understand at times.  He reports he receives medications from [facility] mail order pharmacy and that his wife helps him with managing medications. Reports cost of Lantus is ~$125 for 90 day supply and Keppra is ~$80 for 90 day supply. Patient unaware of current household income and total out of pocket expenditures. Patient did not have any additional concerns/questions about his medications.   Objective: The ASCVD Risk score Bradley Bussing DC Jr., et al., 2013) failed to calculate for the following reasons:   The patient has a prior MI or stroke diagnosis  Lab Results  Component Value Date   CREATININE 0.97 10/26/2018   CREATININE 0.95 10/16/2017   CREATININE 0.93 08/03/2017    Lab Results  Component Value Date   HGBA1C 7.2 (A) 04/04/2019    Lipid Panel     Component Value Date/Time   CHOL 234 (H) 10/26/2018 0838   TRIG 343.0 (H) 10/26/2018 0838   HDL 39.60 10/26/2018 0838   CHOLHDL 6 10/26/2018 0838   VLDL 68.6 (H) 10/26/2018 0838   LDLCALC 144 (H) 10/16/2017 1037   LDLDIRECT 171.0 10/26/2018 0838    BP Readings from Last 3 Encounters:  04/29/19 112/68  04/04/19 126/70  10/26/18 (!) 146/86    Allergies  Allergen Reactions  . Benadryl [Diphenhydramine Hcl]     Caused seizures.   Bradley Davila [Honey Bee Davila]     Facial swelling after sting  . Carbamazepine     Inc in seizures  . Lamotrigine     Inc in seizures  . Metformin And Related      Tolerates extended release.   Marland Kitchen Phenobarbital     rash    Medications Reviewed Today    Reviewed by Bradley Davila, Student-PharmD (Student-PharmD) on 06/14/19 at 1053  Med List Status: <None>  Medication Order Taking? Sig Documenting Provider Last Dose Status Informant  acetaminophen (TYLENOL) 325 MG tablet [id number] Yes You can take 2 tablets every 6 hours as needed for pain. Use this first then the prescribed pain medication.   DO NOT TAKE MORE THAN 4000 MG OF TYLENOL PER DAY.  IT CAN HARM YOUR LIVER. Bradley Regal, PA-C Taking Active   atorvastatin (LIPITOR) 20 MG tablet [id number] Yes Take 1 tablet (20 mg total) by mouth daily. Bradley Ghent, MD Taking Active   EPINEPHrine 0.3 mg/0.3 mL IJ SOAJ injection [id number] No Inject 0.3 mg into the muscle as needed. Bradley Loffler, MD Not Taking Active Self           Med Note Bradley Davila, Bradley Davila Aug 05, 2017  6:59 AM) Never used  glucose blood test strip [id number] Yes USE AS DIRECTED  TO TEST BLOOD SUGAR 3 TIMES DAILY AS NEEDED, E11.9, treated with insulin. Bradley Ghent, MD Taking Active   Insulin Glargine (LANTUS SOLOSTAR) 100 UNIT/ML Solostar Pen [id number] Yes Inject 16-20 Units into the skin daily. Bradley Ghent, MD Taking Active   latanoprost Ivin Poot) 0.005 % ophthalmic solution [id number] Yes  [provider] Taking Active   levETIRAcetam (KEPPRA) 1000 MG tablet [id number] Yes Take one Tab in AM and 1.5 Tab. in PM by mouth. Bradley Davila, Bradley Partridge, MD Taking Active            Med Note ([place], [place]   Tue Jun 14, 2019 10:31 AM) 1 tab in AM and 1 tab in PM  meloxicam (MOBIC) 15 MG tablet [id number] Yes TAKE 1 TABLET EVERY DAY WITH FOOD AS NEEDED FOR PAIN Bradley Ghent, MD Taking Active   metFORMIN (GLUCOPHAGE-XR) 500 MG 24 hr tablet [id number] Yes TAKE 4 TABLETS (1,000-2,000 MG TOTAL) BY MOUTH DAILY WITH BREAKFAST. Bradley Ghent, MD Taking Active            Med Note ([place], [place]   Tue Jun 14, 2019 10:32  AM) 2 tab in AM and 2 tab in PM  oxcarbazepine (TRILEPTAL) 600 MG tablet [id number] Yes Take 1 tablet (600 mg total) by mouth 2 (two) times daily. Bradley Davila, Bradley Partridge, MD Taking Active   TRUEPLUS LANCETS 33G [place] [id number] Yes Use as instructed to test blood sugar once daily or as needed.  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 Bradley Ghent, MD Taking Active           Assessment: Drugs sorted by system:  Neurologic/Psychologic: levetiracetam, oxcarbazepine  Cardiovascular: atorvastatin  Endocrine: metformin, lantus   Topical: latanoprost  Pain: acetaminophen, meloxicam  Miscellaneous:epinephrine  Medication Review Findings:  . Levetiracetam: Patient reports taking 1023m BID - updated medication list.  . Metformin XR: Patient reports taking  1003mBID - updated medication list.  . No longer using fluocinonide cream, nystatin powder, loratadine syrup PRN - removed from medication list.    Medication Assistance Findings:   Medication assistance needs identified: Lantus and Keppra   Extra Help:  Unable to assess eligibility at this time.   Patient Assistance Programs: Lantus made by [employer] Income requirement met: Unknown o Out-of-pocket prescription expenditure met:   Unknown - Alternative option is to apply for a similar product in the same therapeutic class which does not have a required out of pocket expenditure.  A possible substitution is  BaEngineer, agriculturalmade by [employer]  - Reviewed program requirements with patient.    Keppra: Rx Outreach mail order program which offers Keppra 100048m0 day supply (180 tablets) / $40, eligibility depends on total household income - unable to assess today  -Patient unable to articulate income or out-of-pocket expenditure therefore unable to determine if he is eligible for patient assistance program.  Will need to talk to spouse.  Per patient, spouse works Monday - Friday and is not home until after 5PM.  He gave permission for us [place]  call spouse however states she will not be able to talk at lunch and is not optimistic about reaching her.  Called spouse however spouse did not answer and unable to leave voicemail.    Plan: . Will route note to Dr. DunDamita Dunnings .  Will follow up later this week if spouse does not return call.   AbiOralia ManisharmD Candidate UNCGenevaharmD, [phone]: Incoming call from spouse.  Spouse states income is $68,400 which is just under income threshold for insulin programs (400% of FPL).  Spouse confirms patient has not spent 2% of annual income however.  Reviewed possibility of switching from Lantus to Basaglar to avoid having this out-of-pocket requirement.  Spouse is also taking Engineer, agricultural and is familiar with this medication.  She is agreeable for [place] to reach out to provider to ask about substitution from Lantus to [employer].   Dr. Damita Dunnings is provider for insulin.  Reviewed RX Outreach program for Keppra and spouse also agreeable to apply for this.  I will include information on this program with insulin application and patient and spouse will apply to [place] directly.  Also reviewed [employer] OTC catalogue which patient is currently using.   Plan:  I will route patient assistance letter to [place] technician who will coordinate patient assistance program application process for medications listed above.  [facility] pharmacy technician will assist with obtaining all required documents from both patient and provider(s) and submit application(s) once completed.    Ralene Bathe, PharmD, [place] [phone]

## 2019-06-16 ENCOUNTER — Other Ambulatory Visit: Payer: Self-pay | Admitting: Pharmacy Technician

## 2019-06-16 NOTE — Patient Outreach (Signed)
[facility] [facility] Care Management  06/16/2019  RAPHAEL PENNISON 07-07-1961 [id number]                                       Medication Assistance Referral  Referral From: [place] / Ralph [facility] Patient application portion:  Mailed Provider application portion: Faxed  to Dr. Orlinda Blalock Provider address/fax verified via: Office website   Follow up:  Will follow up with patient in 7-10 business days to confirm application(s) have been received.  Maud Deed Chana Bode [place] Certified Pharmacy Technician [place] Management Direct Dial:[phone]

## 2019-06-17 ENCOUNTER — Ambulatory Visit: Payer: Medicare HMO | Admitting: Pharmacist

## 2019-06-20 ENCOUNTER — Telehealth: Payer: Self-pay | Admitting: Neurology

## 2019-06-20 ENCOUNTER — Other Ambulatory Visit: Payer: Self-pay

## 2019-06-20 ENCOUNTER — Other Ambulatory Visit: Payer: Self-pay | Admitting: Neurology

## 2019-06-20 MED ORDER — OXCARBAZEPINE 600 MG PO TABS: 600.0000 mg | ORAL_TABLET | Freq: Two times a day (BID) | ORAL | 0 refills | Status: DC

## 2019-06-20 NOTE — Telephone Encounter (Signed)
Pt wife states the mail order of oxcarbazepine (TRILEPTAL) 600 MG tablet is in transit.  Wife states pt only has enough of the oxcarbazepine (TRILEPTAL) 600 MG tablet for today.  She is asking if at least a weeks worth [facility] #[id number]

## 2019-06-20 NOTE — Patient Outreach (Signed)
[place] [facility]) Care Management  06/20/2019  Bradley Davila 08/20/1961 [id number]   Telephone call to patient to complete initial assessment for disease management.  No answer.  Unable to leave a message.    Plan: RN CM will attempt patient again within the month of October.    [name], RN, MSN [place] Management Care Management Coordinator Direct Line [phone] Cell [phone] Toll Free: [phone]  Fax: [phone]

## 2019-06-20 NOTE — Telephone Encounter (Signed)
28 tablets sent to the pharmacy provided by the patient's wife as a bridge script while waiting for RX from mail.

## 2019-06-26 ENCOUNTER — Other Ambulatory Visit: Payer: Self-pay | Admitting: Family Medicine

## 2019-06-26 MED ORDER — BASAGLAR KWIKPEN 100 UNIT/ML ~~LOC~~ SOPN: 16.0000 [IU] | PEN_INJECTOR | Freq: Every day | SUBCUTANEOUS | Status: DC

## 2019-07-01 ENCOUNTER — Other Ambulatory Visit: Payer: Self-pay

## 2019-07-01 NOTE — Patient Outreach (Signed)
[facility]) Care Management  [place]  07/01/2019   Bradley Davila 21-Dec-1960 [id number]  Subjective: Telephone call to patient for initial assessment completion. Patient very hard to hear due to slurred speech. Patient however able to answer most questions slowly.  Patient states he is doing well with his diabetes but unable to give blood sugar numbers as he states that he does not have them in front of him.  His wife assists with medications and he is taking as prescribed.  Discussed with patient diet and exercise as important pieces to diabetes management. He verbalized understanding.   Objective:   Encounter Medications:  Outpatient Encounter Medications as of 07/01/2019  Medication Sig Note  . acetaminophen (TYLENOL) 325 MG tablet You can take 2 tablets every 6 hours as needed for pain. Use this first then the prescribed pain medication.   DO NOT TAKE MORE THAN 4000 MG OF TYLENOL PER DAY.  IT CAN HARM YOUR LIVER.   Marland Kitchen atorvastatin (LIPITOR) 20 MG tablet Take 1 tablet (20 mg total) by mouth daily.   Marland Kitchen glucose blood test strip USE AS DIRECTED  TO TEST BLOOD SUGAR 3 TIMES DAILY AS NEEDED, E11.9, treated with insulin.   . Insulin Glargine (BASAGLAR KWIKPEN) 100 UNIT/ML SOPN Inject 0.16 mLs (16 Units total) into the skin daily.   Marland Kitchen latanoprost (XALATAN) 0.005 % ophthalmic solution    . levETIRAcetam (KEPPRA) 1000 MG tablet Take one Tab in AM and 1.5 Tab. in PM by mouth. 06/14/2019: 1 tab in AM and 1 tab in PM  . meloxicam (MOBIC) 15 MG tablet TAKE 1 TABLET EVERY DAY WITH FOOD AS NEEDED FOR PAIN   . metFORMIN (GLUCOPHAGE-XR) 500 MG 24 hr tablet TAKE 4 TABLETS (1,000-2,000 MG TOTAL) BY MOUTH DAILY WITH BREAKFAST. 06/14/2019: 2 tab in AM and 2 tab in PM  . oxcarbazepine (TRILEPTAL) 600 MG tablet Take 1 tablet (600 mg total) by mouth 2 (two) times daily.   . TRUEPLUS LANCETS 33G MISC Use as instructed to test blood sugar once daily or as needed.  Diagnosis:  E11.65  Non  insulin dependent.   Marland Kitchen EPINEPHrine 0.3 mg/0.3 mL IJ SOAJ injection Inject 0.3 mg into the muscle as needed. 08/05/2017: Never used   No facility-administered encounter medications on file as of 07/01/2019.     Functional Status:  In your present state of health, do you have any difficulty performing the following activities: 07/01/2019 10/26/2018  Hearing? 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 Y Y  Comment Wife assists -  Walking or climbing stairs? N N  Dressing or bathing? N N  Doing errands, shopping? Tempie Donning  Comment wife assists -  Conservation officer, nature and eating ? - N  Using the Toilet? - N  In the past six months, have you accidently leaked urine? - Y  Do you have problems with loss of bowel control? - Y  Managing your Medications? Y N  Comment wife manages -  Managing your Finances? Y N  Comment wife manages due to stroke/seizures -  Runner, broadcasting/film/video? N N  Some recent data might be hidden    Fall/Depression Screening: Fall Risk  07/01/2019 06/10/2019 10/26/2018  Falls in the past year? 0 0 0  Number falls in past yr: - 0 -   PHQ 2/9 Scores 06/10/2019 10/26/2018 10/16/2017 10/05/2013  PHQ - 2 Score  $'1 3 2 'B$ 0  PHQ- 9 Score - 8 10 -    Assessment: Patient needing ongoing support and management of diabetes.   Plan:  [facility] CM Care Plan Problem One     Most Recent Value  Care Plan Problem One  Knowledge of Self care managment of Diabetes control   Role Documenting the Problem One  Care Management Telephonic Coordinator  Care Plan for Problem One  Active  [facility] Long Term Goal   Patient will be able to report maintaining A1c at goal of less than 8   THN Long Term Goal Start Date  06/10/19  Interventions for Problem One Long Term Goal  RN CM discussed A1c goals, diet, exercise, and importance of keeping blood sugars under control. Patient received packet with diabetes education book.   Patient offers no questions on educational materials.  THN CM Short Term Goal #1   Patient will be able to maintain weight without weigh gain over the next 30 days   THN CM Short Term Goal #1 Start Date  06/10/19  Interventions for Short Term Goal #1  Discussed importance of activity in controlling weight.  THN CM Short Term Goal #2   Patient will verbalize at least 4 healthy snack choices over the next 30 days   THN CM Short Term Goal #2 Start Date  06/10/19  [facility] CM Short Term Goal #2 Met Date  07/01/19     RN CM will contact patient again next month and patient agreeable.    [name], RN, MSN [place] Management Care Management Coordinator Direct Line [phone] Cell [phone] Toll Free: [phone]  Fax: [phone]

## 2019-07-05 ENCOUNTER — Ambulatory Visit: Payer: Medicare HMO

## 2019-07-07 ENCOUNTER — Ambulatory Visit (INDEPENDENT_AMBULATORY_CARE_PROVIDER_SITE_OTHER): Payer: Medicare HMO | Admitting: Family Medicine

## 2019-07-07 ENCOUNTER — Other Ambulatory Visit: Payer: Self-pay | Admitting: Pharmacy Technician

## 2019-07-07 ENCOUNTER — Other Ambulatory Visit: Payer: Self-pay

## 2019-07-07 ENCOUNTER — Encounter: Payer: Self-pay | Admitting: Family Medicine

## 2019-07-07 ENCOUNTER — Ambulatory Visit: Payer: Medicare HMO | Admitting: Family Medicine

## 2019-07-07 VITALS — BP 126/76 | HR 79 | Temp 97.3°F | Ht 66.5 in | Wt 217.6 lb

## 2019-07-07 DIAGNOSIS — E119 Type 2 diabetes mellitus without complications: Secondary | ICD-10-CM

## 2019-07-07 DIAGNOSIS — N529 Male erectile dysfunction, unspecified: Secondary | ICD-10-CM

## 2019-07-07 DIAGNOSIS — E1165 Type 2 diabetes mellitus with hyperglycemia: Secondary | ICD-10-CM

## 2019-07-07 DIAGNOSIS — IMO0002 Reserved for concepts with insufficient information to code with codable children: Secondary | ICD-10-CM

## 2019-07-07 LAB — POCT GLYCOSYLATED HEMOGLOBIN (HGB A1C): Hemoglobin A1C: 6.8 % — AB (ref 4.0–5.6)

## 2019-07-07 MED ORDER — LEVETIRACETAM 1000 MG PO TABS: 1000.0000 mg | ORAL_TABLET | Freq: Two times a day (BID) | ORAL | Status: DC

## 2019-07-07 MED ORDER — BASAGLAR KWIKPEN 100 UNIT/ML ~~LOC~~ SOPN: 14.0000 [IU] | PEN_INJECTOR | Freq: Every day | SUBCUTANEOUS | Status: DC

## 2019-07-07 NOTE — Progress Notes (Signed)
Diabetes:  Using medications without difficulties: yes Hypoglycemic episodes: rarely down to 90s.  Cautions d/w pt. Hyperglycemic episodes:no Feet problems:see below.   Blood Sugars averaging: usually ~100-120 a1c 6.8.  Discussed with patient at office visit.  He still has some R 5th toe pain with a lateral callus noted on the the toe.  His pain is exacerbated with some shoes.  No more trauma.    Erectile dysfunction discussed with patient.  He wanted to work on getting his weight down and improve his diabetic control and he will update me as needed.  Meds, vitals, and allergies reviewed.   ROS: Per HPI unless specifically indicated in ROS section   GEN: nad, alert and oriented, speech at baseline. HEENT: ncat NECK: supple w/o LA CV: rrr. PULM: ctab, no inc wob ABD: soft, +bs EXT: no edema SKIN: Well-perfused.  Diabetic foot exam: Normal inspection No skin breakdown He still has some R 5th toe pain with a lateral callus noted on the the toe.  Normal DP pulses Normal sensation to light touch and monofilament Nails normal  Routine foot care discussed with patient.

## 2019-07-07 NOTE — Patient Instructions (Addendum)
As long as your AM sugar below 130, then cut back by 1 unit per day.  Update me as needed.  Recheck labs prior to a physical in 2021.  Take care.  Glad to see you.

## 2019-07-08 NOTE — Patient Outreach (Signed)
[place] [facility]) Care Management  07/08/2019  EDGERRIN BURRAGE 08-23-1961 [id number]    Successful call placed to patient regarding patient assistance application(s) for Basaglar , HIPAA identifiers verified. Mr. Hasting confirms that he received [facility] patient assistance application but has not completed it. Verified is he has any questions about the application which he stated "No". Verified that he has my contact information in case he needs to contact me.  Follow up:  Will route note to [facility] S for case closure if document(s) have not been received in the next 15 business days.  Maud Deed Chana Bode [place] Certified Pharmacy Technician [place] Management Direct [phone]

## 2019-07-10 DIAGNOSIS — N529 Male erectile dysfunction, unspecified: Secondary | ICD-10-CM | POA: Insufficient documentation

## 2019-07-10 NOTE — Assessment & Plan Note (Signed)
Erectile dysfunction discussed with patient.  He wanted to work on getting his weight down and improve his diabetic control and he will update me as needed.

## 2019-07-10 NOTE — Assessment & Plan Note (Signed)
a1c 6.8.  Discussed with patient at office visit.  Goal to avoid hypoglycemia.  Continue work on diet and exercise.  I thanked him for his effort.  He can taper his insulin if needed.  See after visit summary.  Recheck at routine physical.  Foot care discussed with patient.  See above.

## 2019-07-18 DIAGNOSIS — H40003 Preglaucoma, unspecified, bilateral: Secondary | ICD-10-CM | POA: Diagnosis not present

## 2019-07-21 ENCOUNTER — Encounter: Payer: Self-pay | Admitting: Internal Medicine

## 2019-07-29 ENCOUNTER — Other Ambulatory Visit: Payer: Self-pay | Admitting: Pharmacist

## 2019-07-29 NOTE — Patient Outreach (Signed)
[facility]) Care Management  [place]   07/29/2019  Bradley Davila 1961-01-16 [id number]  [place] has mailed out patient assistance program application(s) for Lantus to patient and confirmed that application(s) received.   Patient has not yet returned the application(s) to [facility].  [facility] pharmacy will therefore close case at this time until patient able to return application(s).  If / when application(s) received at [facility], pharmacy can re-pen case and assist with submitting application(s) for processing.    Ralene Bathe, PharmD, [place] [phone]

## 2019-08-04 ENCOUNTER — Other Ambulatory Visit: Payer: Self-pay

## 2019-08-04 NOTE — Patient Outreach (Signed)
[place] [facility]) Care Management  [place]  08/04/2019   ONUR MORI 1960/11/12 [id number]  Subjective: Telephone call to patient for monthly call. Patient reports he is doing good. Congratulated patient on A1c of 6.8.  Encouraged patient to keep up the progress.  Discussed diet with patient.  He verbalized understanding and declines any needs.   Objective:   Encounter Medications:  Outpatient Encounter Medications as of 08/04/2019  Medication Sig  . acetaminophen (TYLENOL) 325 MG tablet You can take 2 tablets every 6 hours as needed for pain. Use this first then the prescribed pain medication.   DO NOT TAKE MORE THAN 4000 MG OF TYLENOL PER DAY.  IT CAN HARM YOUR LIVER.  Marland Kitchen atorvastatin (LIPITOR) 20 MG tablet Take 1 tablet (20 mg total) by mouth daily.  Marland Kitchen EPINEPHrine 0.3 mg/0.3 mL IJ SOAJ injection Inject 0.3 mg into the muscle as needed.  Marland Kitchen glucose blood test strip USE AS DIRECTED  TO TEST BLOOD SUGAR 3 TIMES DAILY AS NEEDED, E11.9, treated with insulin.  . Insulin Glargine (BASAGLAR KWIKPEN) 100 UNIT/ML SOPN Inject 0.14 mLs (14 Units total) into the skin daily.  Marland Kitchen latanoprost (XALATAN) 0.005 % ophthalmic solution   . levETIRAcetam (KEPPRA) 1000 MG tablet Take 1 tablet (1,000 mg total) by mouth 2 (two) times daily.  . meloxicam (MOBIC) 15 MG tablet TAKE 1 TABLET EVERY DAY WITH FOOD AS NEEDED FOR PAIN  . metFORMIN (GLUCOPHAGE-XR) 500 MG 24 hr tablet TAKE 4 TABLETS (1,000-2,000 MG TOTAL) BY MOUTH DAILY WITH BREAKFAST.  Marland Kitchen oxcarbazepine (TRILEPTAL) 600 MG tablet Take 1 tablet (600 mg total) by mouth 2 (two) times daily.  . TRUEPLUS LANCETS 33G MISC Use as instructed to test blood sugar once daily or as needed.  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   No facility-administered encounter medications on file as of 08/04/2019.     Functional Status:  In your present state of health, do you have any difficulty performing the following activities: 07/01/2019  10/26/2018  Hearing? 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 Y Y  Comment Wife assists -  Walking or climbing stairs? N N  Dressing or bathing? N N  Doing errands, shopping? Tempie Donning  Comment wife assists -  Conservation officer, nature and eating ? N N  Using the Toilet? N N  In the past six months, have you accidently leaked urine? N Y  Do you have problems with loss of bowel control? N Y  Managing your Medications? Y N  Comment wife manages -  Managing your Finances? Y N  Comment wife manages due to stroke/seizures -  [place], broadcasting/film/video? N N  Some recent data might be hidden    Fall/Depression Screening: Fall Risk  07/01/2019 06/10/2019 10/26/2018  Falls in the past year? 0 0 0  Number falls in past yr: - 0 -   PHQ 2/9 Scores 07/01/2019 06/10/2019 10/26/2018 10/16/2017 10/05/2013  PHQ - 2 Score '1 1 3 2 '$ 0  PHQ- 9 Score - - 8 10 -    Assessment: Patient managing diabetes with lower A1c of 6.8.  Plan:  [facility] CM Care Plan Problem One     Most Recent Value  Care Plan Problem One  Knowledge of Self care managment of Diabetes control   Role Documenting the Problem One  Care Management Telephonic Coordinator  Care Plan for Problem One  Active  THN Long Term Goal   Patient will be able to report maintaining A1c at goal of less than 8   THN Long Term Goal Start Date  08/04/19  Interventions for Problem One Long Term Goal  Patient A1c 6.8 on last visit. Patient reports limiting unhealthy snacks and sweets.    THN CM Short Term Goal #1   Patient will be able to maintain weight without weigh gain over the next 30 days   THN CM Short Term Goal #1 Start Date  06/10/19  [facility] CM Short Term Goal #1 Met Date  08/04/19  THN CM Short Term Goal #2   Patient will verbalize at least 4 healthy snack choices over the next 30 days   THN CM Short Term Goal #2 Start Date  06/10/19  [facility] CM Short Term Goal #2 Met Date   07/01/19     RN CM will contact patient next month and patient agreeable.   [name], RN, MSN [place] Management Care Management Coordinator Direct Line [phone] Cell [phone] Toll Free: [phone]  Fax: [phone]

## 2019-08-05 ENCOUNTER — Ambulatory Visit: Payer: Self-pay

## 2019-08-30 ENCOUNTER — Other Ambulatory Visit: Payer: Self-pay

## 2019-08-30 NOTE — Patient Outreach (Signed)
[facility]) Care Management  [place]  08/30/2019   [name] [birth year] [id number]  Subjective: Telephone call to patient.  He reports that he is doing good.  Discussed diabetes and blood sugar ranges.  Last blood sugar was 140 this AM.  Encouraged patient to continue diabetes management.  He verbalized understanding and denies needs.    Objective:   Encounter Medications:  Outpatient Encounter Medications as of 08/30/2019  Medication Sig  . acetaminophen (TYLENOL) 325 MG tablet You can take 2 tablets every 6 hours as needed for pain. Use this first then the prescribed pain medication.   DO NOT TAKE MORE THAN 4000 MG OF TYLENOL PER DAY.  IT CAN HARM YOUR LIVER.  Marland Kitchen atorvastatin (LIPITOR) 20 MG tablet Take 1 tablet (20 mg total) by mouth daily.  Marland Kitchen EPINEPHrine 0.3 mg/0.3 mL IJ SOAJ injection Inject 0.3 mg into the muscle as needed.  Marland Kitchen glucose blood test strip USE AS DIRECTED  TO TEST BLOOD SUGAR 3 TIMES DAILY AS NEEDED, E11.9, treated with insulin.  . Insulin Glargine (BASAGLAR KWIKPEN) 100 UNIT/ML SOPN Inject 0.14 mLs (14 Units total) into the skin daily.  Marland Kitchen latanoprost (XALATAN) 0.005 % ophthalmic solution   . levETIRAcetam (KEPPRA) 1000 MG tablet Take 1 tablet (1,000 mg total) by mouth 2 (two) times daily.  . meloxicam (MOBIC) 15 MG tablet TAKE 1 TABLET EVERY DAY WITH FOOD AS NEEDED FOR PAIN  . metFORMIN (GLUCOPHAGE-XR) 500 MG 24 hr tablet TAKE 4 TABLETS (1,000-2,000 MG TOTAL) BY MOUTH DAILY WITH BREAKFAST.  Marland Kitchen oxcarbazepine (TRILEPTAL) 600 MG tablet Take 1 tablet (600 mg total) by mouth 2 (two) times daily.  . TRUEPLUS LANCETS 33G MISC Use as instructed to test blood sugar once daily or as needed.  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   No facility-administered encounter medications on file as of 08/30/2019.    Functional Status:  In your present state of health, do you have any difficulty performing the following activities: 07/01/2019  10/26/2018  Hearing? 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 Y Y  Comment Wife assists -  Walking or climbing stairs? N N  Dressing or bathing? N N  Doing errands, shopping? Bradley Davila  Comment wife assists -  Conservation officer, nature and eating ? N N  Using the Toilet? N N  In the past six months, have you accidently leaked urine? N Y  Do you have problems with loss of bowel control? N Y  Managing your Medications? Y N  Comment wife manages -  Managing your Finances? Y N  Comment wife manages due to stroke/seizures -  Runner, broadcasting/film/video? N N  Some recent data might be hidden    Fall/Depression Screening: Fall Risk  07/01/2019 06/10/2019 10/26/2018  Falls in the past year? 0 0 0  Number falls in past yr: - 0 -   PHQ 2/9 Scores 07/01/2019 06/10/2019 10/26/2018 10/16/2017 10/05/2013  PHQ - 2 Score 1 1 3 2  0  PHQ- 9 Score - - 8 10 -    Assessment: Patient continues disease management of diabetes.  Plan:  [facility] CM Care Plan Problem One     Most Recent Value  Care Plan Problem One  Knowledge of Self care managment of Diabetes control   Role Documenting the Problem One  Care Management Telephonic Coordinator  Care Plan for Problem One  Active  [facility] Long Term Goal   Patient will be able to report maintaining A1c at goal of less than 8 within the next 90 days.    [facility] Long Term Goal Start Date  08/30/19  Interventions for Problem One Long Term Goal  Discussed blood sugar readings.  Patient checking sugars as ordered and watching his diet.       RN CM will contact patient in the month of March and patient agreeable.    [name], RN, MSN [place] [facility] Care Management Coordinator Direct Line [phone] Cell [phone] Toll Free: [phone]  Fax: [phone]

## 2019-10-31 ENCOUNTER — Ambulatory Visit (INDEPENDENT_AMBULATORY_CARE_PROVIDER_SITE_OTHER): Payer: Medicare HMO | Admitting: Family Medicine

## 2019-10-31 ENCOUNTER — Ambulatory Visit: Payer: Medicare HMO

## 2019-10-31 ENCOUNTER — Other Ambulatory Visit: Payer: Self-pay

## 2019-10-31 ENCOUNTER — Encounter: Payer: Self-pay | Admitting: Family Medicine

## 2019-10-31 VITALS — BP 140/80 | HR 75 | Temp 97.8°F | Ht 66.5 in | Wt 224.2 lb

## 2019-10-31 DIAGNOSIS — Z0000 Encounter for general adult medical examination without abnormal findings: Secondary | ICD-10-CM

## 2019-10-31 DIAGNOSIS — E785 Hyperlipidemia, unspecified: Secondary | ICD-10-CM | POA: Diagnosis not present

## 2019-10-31 DIAGNOSIS — G40309 Generalized idiopathic epilepsy and epileptic syndromes, not intractable, without status epilepticus: Secondary | ICD-10-CM

## 2019-10-31 DIAGNOSIS — Z7189 Other specified counseling: Secondary | ICD-10-CM

## 2019-10-31 DIAGNOSIS — IMO0002 Reserved for concepts with insufficient information to code with codable children: Secondary | ICD-10-CM

## 2019-10-31 DIAGNOSIS — F17200 Nicotine dependence, unspecified, uncomplicated: Secondary | ICD-10-CM

## 2019-10-31 DIAGNOSIS — R4586 Emotional lability: Secondary | ICD-10-CM

## 2019-10-31 DIAGNOSIS — E119 Type 2 diabetes mellitus without complications: Secondary | ICD-10-CM | POA: Diagnosis not present

## 2019-10-31 DIAGNOSIS — E1165 Type 2 diabetes mellitus with hyperglycemia: Secondary | ICD-10-CM

## 2019-10-31 LAB — LIPID PANEL
Cholesterol: 143 mg/dL (ref 0–200)
HDL: 43.3 mg/dL (ref >39.00)
LDL Cholesterol: 69 mg/dL (ref 0–99)
NonHDL: 99.26
Total CHOL/HDL Ratio: 3
Triglycerides: 151 mg/dL — ABNORMAL HIGH (ref 0.0–149.0)
VLDL: 30.2 mg/dL (ref 0.0–40.0)

## 2019-10-31 LAB — COMPREHENSIVE METABOLIC PANEL
ALT: 25 U/L (ref 0–53)
AST: 17 U/L (ref 0–37)
Albumin: 4.4 g/dL (ref 3.5–5.2)
Alkaline Phosphatase: 84 U/L (ref 39–117)
BUN: 12 mg/dL (ref 6–23)
CO2: 29 mEq/L (ref 19–32)
Calcium: 9.5 mg/dL (ref 8.4–10.5)
Chloride: 100 mEq/L (ref 96–112)
Creatinine, Ser: 0.98 mg/dL (ref 0.40–1.50)
GFR: 78.03 mL/min (ref >60.00)
Glucose, Bld: 122 mg/dL — ABNORMAL HIGH (ref 70–99)
Potassium: 4.8 mEq/L (ref 3.5–5.1)
Sodium: 138 mEq/L (ref 135–145)
Total Bilirubin: 0.4 mg/dL (ref 0.2–1.2)
Total Protein: 7.3 g/dL (ref 6.0–8.3)

## 2019-10-31 LAB — MICROALBUMIN / CREATININE URINE RATIO
Creatinine,U: 259 mg/dL
Microalb Creat Ratio: 9.4 mg/g (ref 0.0–30.0)
Microalb, Ur: 24.2 mg/dL — ABNORMAL HIGH (ref 0.0–1.9)

## 2019-10-31 LAB — HEMOGLOBIN A1C: Hgb A1c MFr Bld: 7.9 % — ABNORMAL HIGH (ref 4.6–6.5)

## 2019-10-31 MED ORDER — BASAGLAR KWIKPEN 100 UNIT/ML ~~LOC~~ SOPN: 18.0000 [IU] | PEN_INJECTOR | Freq: Every day | SUBCUTANEOUS | Status: DC

## 2019-10-31 NOTE — Progress Notes (Addendum)
This visit occurred during the SARS-CoV-2 public health emergency.  Safety protocols were in place, including screening questions prior to the visit, additional usage of staff PPE, and extensive cleaning of exam room while observing appropriate contact time as indicated for disinfecting solutions.  I have personally reviewed the Medicare Annual Wellness questionnaire and have noted 1. The patient's medical and social history 2. Their use of alcohol, tobacco or illicit drugs 3. Their current medications and supplements 4. The patient's functional ability including ADL's, fall risks, home safety risks and hearing or visual             impairment. 5. Diet and physical activities 6. Evidence for depression or mood disorders  The patients weight, height, BMI have been recorded in the chart and visual acuity is per eye clinic.  I have made referrals, counseling and provided education to the patient based review of the above and I have provided the pt with a written personalized care plan for preventive services.  Provider list updated- see scanned forms.  Routine anticipatory guidance given to patient.  See health maintenance. The possibility exists that previously documented standard health maintenance information may have been brought forward from a previous encounter into this note.  If needed, that same information has been updated to reflect the current situation based on today's encounter.    Flu encouraged. Shingles encouraged PNA encouraged Tetanus encouraged Covid vaccine discussed with patient.  Encouraged. Colon cancer screening discussed with patient, reasonable to defer until Covid pandemic is improved and/or patient is vaccinated. Prostate cancer screening and PSA options (with potential risks and benefits of testing vs not testing) were discussed along with recent recs/guidelines.  He declined testing PSA at this point. Advance directive-wife designated patient were  incapacitated Cognitive function addressed- see scanned forms- and if abnormal then additional documentation follows.   Smoking some of the month, not every day.  His brother had cancer, d/w pt.    Rash in the groin and R>L axilla noted.  Getting better slowly.  Itches some.  SZ.  Per neuro.  No events since 2014.  He has routine f/u.  Compliant with meds.   Diabetes:  Using medications without difficulties: taking 18 units and 2000mg  metformin.   Hypoglycemic episodes: no Hyperglycemic episodes:no Feet problems: Healing burn on the left foot.  He stepped on a hot cold about 2 weeks ago.  He uses a wood stove to his house.  Cautions discussed with patient. Blood Sugars averaging: 133 this AM.  eye exam within last year: yes Labs pending.   Elevated Cholesterol: Using medications without problems: yes Muscle aches: no Diet compliance: encouraged.   Exercise: encouraged  Mood d/w pt. no suicidal homicidal intent.  Still okay for outpatient follow-up.  He has been "grumpy".  His mood has been affected by his brother's illness, chronic medical issues, Covid pandemic, etc.  He will consider counseling versus medication.  He will update me as needed.  PMH and SH reviewed  Meds, vitals, and allergies reviewed.   ROS: Per HPI.  Unless specifically indicated otherwise in HPI, the patient denies:  General: fever. Eyes: acute vision changes ENT: sore throat Cardiovascular: chest pain Respiratory: SOB GI: vomiting GU: dysuria Musculoskeletal: acute back pain Derm: acute rash Neuro: acute motor dysfunction Psych: worsening mood Endocrine: polydipsia Heme: bleeding Allergy: hayfever  GEN: nad, alert and oriented, speech at baseline. HEENT: ncat NECK: supple w/o LA CV: rrr. PULM: ctab, no inc wob ABD: soft, +bs EXT: no edema SKIN: no  acute rash but some chronic irritation noted in the axilla.  Diabetic foot exam: Normal inspection No skin breakdown other than small  superficial healing burn on the plantar side of the left foot near the distal metatarsals.  No ulceration. No calluses  Normal DP pulses Normal sensation to light touch and monofilament on left foot but decreased sensation to monofilament on right foot. Nails normal  Health Maintenance  Topic Date Due  . COLONOSCOPY  06/17/2019  . INFLUENZA VACCINE  12/14/2019 (Originally 04/16/2019)  . PNEUMOCOCCAL POLYSACCHARIDE VACCINE AGE 12-64 HIGH RISK  09/14/2020 (Originally 11/08/1962)  . OPHTHALMOLOGY EXAM  11/10/2019  . HEMOGLOBIN A1C  04/29/2020  . TETANUS/TDAP  06/09/2020  . FOOT EXAM  10/30/2020  . URINE MICROALBUMIN  10/30/2020  . Hepatitis C Screening  Completed  . HIV Screening  Completed

## 2019-10-31 NOTE — Patient Instructions (Addendum)
Go to the lab on the way out.   We'll call about the lab results.   I'll work on your refills after that.  [facility].   If your mood is worse then let me know.  If you want to go to counseling then call me.  Take care.  Glad to see you. Plan on recheck in about 3-4 months with A1c at the [place].

## 2019-11-03 DIAGNOSIS — F17200 Nicotine dependence, unspecified, uncomplicated: Secondary | ICD-10-CM | POA: Insufficient documentation

## 2019-11-03 DIAGNOSIS — Z0000 Encounter for general adult medical examination without abnormal findings: Secondary | ICD-10-CM | POA: Insufficient documentation

## 2019-11-03 DIAGNOSIS — E785 Hyperlipidemia, unspecified: Secondary | ICD-10-CM | POA: Insufficient documentation

## 2019-11-03 MED ORDER — MELOXICAM 15 MG PO TABS: ORAL_TABLET | ORAL | 3 refills | Status: DC

## 2019-11-03 MED ORDER — BASAGLAR KWIKPEN 100 UNIT/ML ~~LOC~~ SOPN: 18.0000 [IU] | PEN_INJECTOR | Freq: Every day | SUBCUTANEOUS | 3 refills | Status: DC

## 2019-11-03 MED ORDER — ATORVASTATIN CALCIUM 20 MG PO TABS: 20.0000 mg | ORAL_TABLET | Freq: Every day | ORAL | 3 refills | Status: DC

## 2019-11-03 NOTE — Assessment & Plan Note (Signed)
Continue statin.  See notes on labs.  Discussed diet and exercise.

## 2019-11-03 NOTE — Addendum Note (Signed)
Addended by: Tonia Ghent on: 11/03/2019 11:52 AM   Modules accepted: Orders

## 2019-11-03 NOTE — Assessment & Plan Note (Signed)
  Smoking some of the month, not every day.  His brother had cancer, d/w pt.   cessation encouraged.

## 2019-11-03 NOTE — Assessment & Plan Note (Signed)
Mood d/w pt. no suicidal homicidal intent.  Still okay for outpatient follow-up.  He has been "grumpy".  His mood has been affected by his brother's illness, chronic medical issues, Covid pandemic, etc.  He will consider counseling versus medication.  He will update me as needed.

## 2019-11-03 NOTE — Assessment & Plan Note (Signed)
Advance directive-wife designated patient were incapacitated. 

## 2019-11-03 NOTE — Assessment & Plan Note (Signed)
Per neuro.  No events since 2014.  He has routine f/u.  Compliant with meds.

## 2019-11-03 NOTE — Assessment & Plan Note (Signed)
Flu encouraged. Shingles encouraged PNA encouraged Tetanus encouraged Covid vaccine discussed with patient.  Encouraged. Colon cancer screening discussed with patient, reasonable to defer until Covid pandemic is improved and/or patient is vaccinated. Prostate cancer screening and PSA options (with potential risks and benefits of testing vs not testing) were discussed along with recent recs/guidelines.  He declined testing PSA at this point. Advance directive-wife designated patient were incapacitated Cognitive function addressed- see scanned forms- and if abnormal then additional documentation follows.

## 2019-11-03 NOTE — Assessment & Plan Note (Signed)
Labs pending.  See notes on labs.  No change in meds at this point.

## 2019-11-04 ENCOUNTER — Other Ambulatory Visit: Payer: Self-pay | Admitting: Family Medicine

## 2019-11-04 MED ORDER — LISINOPRIL 2.5 MG PO TABS: 2.5000 mg | ORAL_TABLET | Freq: Every day | ORAL | 3 refills | Status: DC

## 2019-11-07 ENCOUNTER — Encounter: Payer: Self-pay | Admitting: Family Medicine

## 2019-11-09 ENCOUNTER — Other Ambulatory Visit: Payer: Self-pay | Admitting: Family Medicine

## 2019-11-09 MED ORDER — LANTUS SOLOSTAR 100 UNIT/ML ~~LOC~~ SOPN: 18.0000 [IU] | PEN_INJECTOR | Freq: Every day | SUBCUTANEOUS | 99 refills | Status: DC

## 2019-11-17 DIAGNOSIS — E119 Type 2 diabetes mellitus without complications: Secondary | ICD-10-CM | POA: Diagnosis not present

## 2019-11-17 DIAGNOSIS — E1136 Type 2 diabetes mellitus with diabetic cataract: Secondary | ICD-10-CM | POA: Diagnosis not present

## 2019-11-17 LAB — HM DIABETES EYE EXAM

## 2019-11-25 ENCOUNTER — Other Ambulatory Visit: Payer: Self-pay | Admitting: Family Medicine

## 2019-11-28 ENCOUNTER — Other Ambulatory Visit: Payer: Self-pay

## 2019-11-28 NOTE — Patient Outreach (Signed)
[facility] [facility]) Care Management  [place]  11/28/2019   Bradley Davila March 30, 1961 [id number]  Subjective: Telephone call to patient for quarterly follow up. He states he is doing good.  He reports that his sugars are doing good and that last sugar was 128 this AM. Discussed importance of sugar control and diet control.  He verbalized understanding. Patient reports he has not had his COVID vaccine but plans to get it.  Discussed COVID vaccine and COVID safety.  He verbalized understanding.   Objective:   Encounter Medications:  Outpatient Encounter Medications as of 11/28/2019  Medication Sig  . acetaminophen (TYLENOL) 325 MG tablet You can take 2 tablets every 6 hours as needed for pain. Use this first then the prescribed pain medication.   DO NOT TAKE MORE THAN 4000 MG OF TYLENOL PER DAY.  IT CAN HARM YOUR LIVER.  Marland Kitchen atorvastatin (LIPITOR) 20 MG tablet Take 1 tablet (20 mg total) by mouth daily.  Marland Kitchen EPINEPHrine 0.3 mg/0.3 mL IJ SOAJ injection Inject 0.3 mg into the muscle as needed.  . Insulin Glargine (LANTUS SOLOSTAR) 100 UNIT/ML Solostar Pen Inject 18 Units into the skin daily.  Marland Kitchen latanoprost (XALATAN) 0.005 % ophthalmic solution   . levETIRAcetam (KEPPRA) 1000 MG tablet Take 1 tablet (1,000 mg total) by mouth 2 (two) times daily.  Marland Kitchen lisinopril (ZESTRIL) 2.5 MG tablet Take 1 tablet (2.5 mg total) by mouth daily.  . meloxicam (MOBIC) 15 MG tablet TAKE 1 TABLET EVERY DAY WITH FOOD AS NEEDED FOR PAIN  . metFORMIN (GLUCOPHAGE-XR) 500 MG 24 hr tablet TAKE 4 TABLETS (1,000-2,000 MG TOTAL) BY MOUTH DAILY WITH BREAKFAST.  Marland Kitchen oxcarbazepine (TRILEPTAL) 600 MG tablet Take 1 tablet (600 mg total) by mouth 2 (two) times daily.  . TRUE METRIX BLOOD GLUCOSE TEST test strip USE AS DIRECTED  TO TEST BLOOD SUGAR ONE TIME DAILY  OR AS NEEDED  . TRUEPLUS LANCETS 33G MISC Use as instructed to test blood sugar once daily or as needed.  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   No  facility-administered encounter medications on file as of 11/28/2019.    Functional Status:  In your present state of health, do you have any difficulty performing the following activities: 07/01/2019  Hearing? 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 Y  Comment Wife assists  Walking or climbing stairs? N  Dressing or bathing? N  Doing errands, shopping? Y  Comment wife assists  Conservation officer, nature and eating ? N  Using the Toilet? N  In the past six months, have you accidently leaked urine? N  Do you have problems with loss of bowel control? N  Managing your Medications? Y  Comment wife manages  Managing your Finances? Y  Comment wife manages due to [employer] or managing your Housekeeping? N  Some recent data might be hidden    Fall/Depression Screening: Fall Risk  11/28/2019 10/31/2019 07/01/2019  Falls in the past year? 0 0 0  Number falls in past yr: - - -  Follow up - Falls evaluation completed -   PHQ 2/9 Scores 11/09/2019 07/01/2019 06/10/2019 10/26/2018 10/16/2017 10/05/2013  PHQ - 2 Score 2 1 1 3 2  0  PHQ- 9 Score 10 - - 8 10 -    Assessment: Patient continues to manage chronic illnesses.    Plan:  [facility] CM Care Plan Problem One     Most Recent  Value  Care Plan Problem One  Knowledge of Self care managment of Diabetes control   Role Documenting the Problem One  Care Management Telephonic Coordinator  Care Plan for Problem One  Active  [facility] Long Term Goal   Patient will be able to report maintaining A1c at goal of less than 8 within the next 90 days.    [facility] Long Term Goal Start Date  11/28/19  Interventions for Problem One Long Term Goal  RN CM discussed with patient his blood sugars and importance of diet.       RN CM will outreach again in the month of June for quarterly call and patient agreeable.   [name], RN, MSN [place] [facility] Care Management Coordinator Direct  Line [phone] Cell [phone] Toll Free: [phone]  Fax: [phone]

## 2019-12-04 ENCOUNTER — Encounter: Payer: Self-pay | Admitting: Family Medicine

## 2019-12-06 ENCOUNTER — Other Ambulatory Visit: Payer: Self-pay | Admitting: Neurology

## 2019-12-13 ENCOUNTER — Telehealth: Payer: Self-pay | Admitting: Neurology

## 2019-12-13 MED ORDER — OXCARBAZEPINE 600 MG PO TABS: 600.0000 mg | ORAL_TABLET | Freq: Two times a day (BID) | ORAL | 0 refills | Status: DC

## 2019-12-13 NOTE — Telephone Encounter (Signed)
1) Medication(s) Requested (by name): oxcarbazepine (TRILEPTAL) 600 MG tablet [  2) [facility]:   [facility]

## 2019-12-13 NOTE — Telephone Encounter (Signed)
I have sent a 1 month supply to [facility] because patient has not been seen in clinic since 2019. Normally a 3 mth supply would be sent but I was able to provide the 1 mth supply until patient follows up with [facility]

## 2019-12-26 ENCOUNTER — Other Ambulatory Visit: Payer: Self-pay | Admitting: Family Medicine

## 2019-12-26 ENCOUNTER — Encounter: Payer: Self-pay | Admitting: Family Medicine

## 2019-12-26 NOTE — Progress Notes (Signed)
[facility]/thx dmf

## 2020-01-03 ENCOUNTER — Encounter: Payer: Self-pay | Admitting: Adult Health

## 2020-01-03 ENCOUNTER — Ambulatory Visit: Payer: Medicare HMO | Admitting: Adult Health

## 2020-01-03 VITALS — BP 142/79 | HR 89 | Ht 66.0 in | Wt 224.0 lb

## 2020-01-03 DIAGNOSIS — R569 Unspecified convulsions: Secondary | ICD-10-CM

## 2020-01-03 DIAGNOSIS — R200 Anesthesia of skin: Secondary | ICD-10-CM | POA: Diagnosis not present

## 2020-01-03 MED ORDER — LEVETIRACETAM 1000 MG PO TABS: 1000.0000 mg | ORAL_TABLET | Freq: Two times a day (BID) | ORAL | 3 refills | Status: DC

## 2020-01-03 MED ORDER — OXCARBAZEPINE 600 MG PO TABS: 600.0000 mg | ORAL_TABLET | Freq: Two times a day (BID) | ORAL | 3 refills | Status: DC

## 2020-01-03 NOTE — Progress Notes (Signed)
PATIENT: Bradley Davila DOB: 08/02/1961  REASON FOR VISIT: follow up Bradley FROM: patient  Bradley OF PRESENT ILLNESS: Today 01/03/20:  Bradley Davila is a 59 year old male with a Bradley of seizures and cerebral palsy.  He returns today for follow-up.  He is currently on Keppra 1000 mg twice a day and Trileptal 600 mg twice a day.  He denies any seizure events.  Continues to tolerate the medications well.  He does not operate a motor vehicle.  Reports in the last week he has noticed some numbness in the left hand.  Also reports that he drops things easily.  His wife thought that he may be slept on his hand the wrong way.  He denies any discomfort of the arm.  He returns today for an evaluation.  Bradley Davila is a 59 y.o. male and seen here as a referral/ revisit  from Dr. Damita Dunnings for a transfer of care. I have seen Mr. 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 In addition he has diabetes and has developed depression, he underwent a cholecystectomy while still being my patient in 2007. In the meantime he had been followed by my former Environmental manager O'Donovan at [facility], but would like to return to the general neurology practice here for reasons of proximity, as well as easier emergency care. Dr. Ginny Forth noted that the patient's seizure disorder has been very well controlled and has actually just refilled medications for the last years without any additional diagnostic studies being undertaken. For this reason I will continue his current medications.   I will try to obtain his paper records form years ago- if they still exist.    REVIEW OF SYSTEMS: Out of a complete 14 system review of symptoms, the patient complains only of the following symptoms, and all other reviewed systems are  negative.  ALLERGIES: Allergies  Allergen Reactions  . Benadryl [Diphenhydramine Hcl]     Caused seizures.   Jaquelyn Bitter Jacket Venom [Honey Bee Venom]     Facial swelling after sting  . Carbamazepine     Inc in seizures  . Lamotrigine     Inc in seizures  . Metformin And Related     Tolerates extended release.   Marland Kitchen Phenobarbital     rash    HOME MEDICATIONS: Outpatient Medications Prior to Visit  Medication Sig Dispense Refill  . acetaminophen (TYLENOL) 325 MG tablet You can take 2 tablets every 6 hours as needed for pain. Use this first then the prescribed pain medication.   DO NOT TAKE MORE THAN 4000 MG OF TYLENOL PER DAY.  IT CAN HARM YOUR LIVER.    Marland Kitchen atorvastatin (LIPITOR) 20 MG tablet Take 1 tablet (20 mg total) by mouth daily. 90 tablet 3  . EPINEPHrine 0.3 mg/0.3 mL IJ SOAJ injection Inject 0.3 mg into the muscle as needed.    . Insulin Glargine (LANTUS SOLOSTAR) 100 UNIT/ML Solostar Pen Inject 18 Units into the skin daily. 10 pen PRN  . latanoprost (XALATAN) 0.005 % ophthalmic solution     . levETIRAcetam (KEPPRA) 1000 MG tablet Take 1 tablet (1,000 mg total) by mouth 2 (two) times daily.    Marland Kitchen lisinopril (ZESTRIL) 2.5 MG tablet Take 1 tablet (2.5 mg total) by mouth daily. 90 tablet 3  . meloxicam (MOBIC) 15  MG tablet TAKE 1 TABLET EVERY DAY WITH FOOD AS NEEDED FOR PAIN 90 tablet 3  . metFORMIN (GLUCOPHAGE-XR) 500 MG 24 hr tablet TAKE 2-4 TABLETS (1,000-2,000 MG TOTAL) BY MOUTH DAILY WITH BREAKFAST. 360 tablet 3  . oxcarbazepine (TRILEPTAL) 600 MG tablet Take 1 tablet (600 mg total) by mouth 2 (two) times daily. 60 tablet 0  . TRUE METRIX BLOOD GLUCOSE TEST test strip USE AS DIRECTED  TO TEST BLOOD SUGAR ONE TIME DAILY  OR AS NEEDED 100 strip 3  . TRUEPLUS LANCETS 33G MISC Use as instructed to test blood sugar once daily or as needed.  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 100 each 3   No facility-administered medications prior to visit.    PAST MEDICAL Bradley: Past  Medical Bradley:  Diagnosis Date  . Complication of anesthesia    slow to wake up from anesthesia after gall bladder surgery  . 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 Diabetes mellitus without complication ([place])   . Dysphagia as late effect of stroke   . Headache(784.0)   . Hypertension   . Seizures ([place])    h/o grand mal and staring episodes, followed by [facility] neuro, last grand mal July 09, 2013 last seizure  . 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 Stroke [facility])    childhood    PAST SURGICAL Bradley: Past Surgical Bradley:  Procedure Laterality Date  . CHOLECYSTECTOMY  2007  . CYSTECTOMY     on buttock, s/p removal  . HERNIA REPAIR    . INSERTION OF MESH N/A 08/05/2017   Procedure: INSERTION OF MESH;  Surgeon: Coralie Keens, MD;  Location: [place];  Service: General;  Laterality: N/A;  . TONSILLECTOMY    . UMBILICAL HERNIA REPAIR  08/05/2017  . UMBILICAL HERNIA REPAIR N/A 08/05/2017   Procedure: UMBILICAL HERNIA REPAIR;  Surgeon: Coralie Keens, MD;  Location: [place];  Service: General;  Laterality: N/A;  . WISDOM TOOTH EXTRACTION      FAMILY Bradley: Family Bradley  Problem Relation Age of Onset  . Hydrocephalus Mother   . Liver cancer Father   . Alcohol abuse Father   . Alcohol abuse Sister   . Heart disease Brother   . Colon cancer Neg Hx   . Prostate cancer Neg Hx     SOCIAL Bradley: Social Bradley   Socioeconomic Bradley  . Marital status: Married    Spouse name: Clarene Critchley  . Number of children: 1  . Years of education: Not on file  . Highest education level: Not on file  Occupational Bradley    Employer: NOT EMPLOYED  Tobacco Use  . Smoking status: Current Some Day Smoker  . Smokeless tobacco: Never Used  Substance and Sexual Activity  . Alcohol use: Yes    Comment: rarely - 4 to 5 times per year  . Drug use: No  . Sexual activity: Yes  Other Topics Concern  .  Not on file  Social Bradley Narrative   Married 2005, 1 daughter   Disabled due to cva   Social Determinants of Health   Financial Resource Strain:   . Difficulty of Paying Living Expenses:   Food Insecurity: No Food Insecurity  . Worried About Charity fundraiser in the Last Year: Never true  . Ran Out of Food in the Last Year: Never true  Transportation Needs: No Transportation Needs  . Lack of  Transportation (Medical): No  . Lack of Transportation (Non-Medical): No  Physical Activity:   . Days of Exercise per Week:   . Minutes of Exercise per Session:   Stress:   . Feeling of Stress :   Social Connections:   . Frequency of Communication with Friends and Family:   . Frequency of Social Gatherings with Friends and Family:   . Attends Religious Services:   . Active Member of Clubs or Organizations:   . Attends Archivist Meetings:   Marland Kitchen Marital Status:   Intimate Partner Violence:   . Fear of Current or Ex-Partner:   . Emotionally Abused:   Marland Kitchen Physically Abused:   . Sexually Abused:       PHYSICAL EXAM  Vitals:   01/03/20 1103  BP: (!) 142/79  Pulse: 89  Weight: 224 lb (101.6 kg)  Height: '5\' 6"'$  (1.676 m)   Body mass index is 36.15 kg/m.  Generalized: Well developed, in no acute distress   Neurological examination  Mentation: Alert oriented to time, place, Bradley taking. Follows all commands speech and language fluent Cranial nerve II-XII: Pupils were equal round reactive to light. Extraocular movements were full, visual field were full on confrontational test. Facial sensation and strength were normal. Uvula tongue midline. Head turning and shoulder shrug  were normal and symmetric. Motor: The motor testing reveals 5 over 5 strength of all 4 extremities. Good symmetric motor tone is noted throughout.  Sensory: Sensory testing is intact to soft touch on all 4 extremities. No evidence of extinction is noted. Tinel test positive on left Coordination:  Cerebellar testing reveals good finger-nose-finger and heel-to-shin bilaterally.  Gait and station: Gait is normal.  Reflexes: Deep tendon reflexes are symmetric and normal bilaterally.   DIAGNOSTIC DATA (LABS, IMAGING, TESTING) - I reviewed patient records, labs, notes, testing and imaging myself where available.  Lab Results  Component Value Date   WBC 10.6 (H) 08/03/2017   HGB 15.3 08/03/2017   HCT 44.8 08/03/2017   MCV 86.3 08/03/2017   PLT 334 08/03/2017      Component Value Date/Time   NA 138 10/31/2019 1051   K 4.8 10/31/2019 1051   CL 100 10/31/2019 1051   CO2 29 10/31/2019 1051   GLUCOSE 122 (H) 10/31/2019 1051   BUN 12 10/31/2019 1051   CREATININE 0.98 10/31/2019 1051   CALCIUM 9.5 10/31/2019 1051   PROT 7.3 10/31/2019 1051   ALBUMIN 4.4 10/31/2019 1051   AST 17 10/31/2019 1051   ALT 25 10/31/2019 1051   ALKPHOS 84 10/31/2019 1051   BILITOT 0.4 10/31/2019 1051   GFRNONAA >60 08/03/2017 1550   GFRAA >60 08/03/2017 1550   Lab Results  Component Value Date   CHOL 143 10/31/2019   HDL 43.30 10/31/2019   LDLCALC 69 10/31/2019   LDLDIRECT 171.0 10/26/2018   TRIG 151.0 (H) 10/31/2019   CHOLHDL 3 10/31/2019   Lab Results  Component Value Date   HGBA1C 7.9 (H) 10/31/2019   Lab Results  Component Value Date   BWIOMBTD97 416 10/21/2006   Lab Results  Component Value Date   TSH 1.188 10/21/2006      ASSESSMENT AND PLAN 59 y.o. 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 here with:  1.  Seizures  -Continue Keppra and  Trileptal -Blood work today -Advised if he has any seizure events he should let us know   2.  Left hand numbness -Discuss further work-up for left hand numbness but patient deferred for  now -Follow-up in 1 year or sooner if needed   I spent 20 minutes of face-to-face and non-face-to-face time with patient.  This included previsit chart review, lab review, study review, order entry, electronic health record documentation, patient education.  Ward Givens, MSN, NP-C 01/03/2020, 10:57 AM [facility] [address] [place], [place] [address] [phone]

## 2020-01-03 NOTE — Patient Instructions (Signed)
Continue Keppra and Trileptal Blood work today If you have any seizure events please let us know.

## 2020-01-06 LAB — CBC WITH DIFFERENTIAL/PLATELET
Basophils Absolute: 0.1 x10E3/uL (ref 0.0–0.2)
Basos: 1 %
EOS (ABSOLUTE): 0.1 x10E3/uL (ref 0.0–0.4)
Eos: 1 %
Hematocrit: 42.6 % (ref 37.5–51.0)
Hemoglobin: 15 g/dL (ref 13.0–17.7)
Immature Grans (Abs): 0.1 x10E3/uL (ref 0.0–0.1)
Immature Granulocytes: 1 %
Lymphocytes Absolute: 2.8 x10E3/uL (ref 0.7–3.1)
Lymphs: 28 %
MCH: 31.1 pg (ref 26.6–33.0)
MCHC: 35.2 g/dL (ref 31.5–35.7)
MCV: 88 fL (ref 79–97)
Monocytes Absolute: 0.6 x10E3/uL (ref 0.1–0.9)
Monocytes: 6 %
Neutrophils Absolute: 6.3 x10E3/uL (ref 1.4–7.0)
Neutrophils: 63 %
Platelets: 373 x10E3/uL (ref 150–450)
RBC: 4.82 x10E6/uL (ref 4.14–5.80)
RDW: 13.2 % (ref 11.6–15.4)
WBC: 9.9 x10E3/uL (ref 3.4–10.8)

## 2020-01-06 LAB — COMPREHENSIVE METABOLIC PANEL
ALT: 23 IU/L (ref 0–44)
AST: 18 IU/L (ref 0–40)
Albumin/Globulin Ratio: 1.7 (ref 1.2–2.2)
Albumin: 4.5 g/dL (ref 3.8–4.9)
Alkaline Phosphatase: 89 IU/L (ref 39–117)
BUN/Creatinine Ratio: 18 (ref 10–24)
BUN: 17 mg/dL (ref 8–27)
Bilirubin Total: <0.2 mg/dL (ref 0.0–1.2)
CO2: 21 mmol/L (ref 20–29)
Calcium: 9.9 mg/dL (ref 8.6–10.2)
Chloride: 102 mmol/L (ref 96–106)
Creatinine, Ser: 0.95 mg/dL (ref 0.76–1.27)
GFR calc Af Amer: 100 mL/min/{1.73_m2} (ref >59)
GFR calc non Af Amer: 87 mL/min/{1.73_m2} (ref >59)
Globulin, Total: 2.7 g/dL (ref 1.5–4.5)
Glucose: 119 mg/dL — ABNORMAL HIGH (ref 65–99)
Potassium: 5 mmol/L (ref 3.5–5.2)
Sodium: 140 mmol/L (ref 134–144)
Total Protein: 7.2 g/dL (ref 6.0–8.5)

## 2020-01-06 LAB — 10-HYDROXYCARBAZEPINE: Oxcarbazepine SerPl-Mcnc: 22 ug/mL (ref 10–35)

## 2020-01-09 ENCOUNTER — Telehealth: Payer: Self-pay

## 2020-01-09 NOTE — Telephone Encounter (Signed)
I called pt. I advised him that his labs were unremarkable. Pt verbalized understanding of results. Pt had no questions at this time but was encouraged to call back if questions arise.

## 2020-01-09 NOTE — Telephone Encounter (Signed)
-----   Message from Ward Givens, NP sent at 01/09/2020 10:28 AM EDT ----- Labs results are unremarkable. Please call patient with results.

## 2020-02-27 ENCOUNTER — Other Ambulatory Visit: Payer: Self-pay

## 2020-02-27 NOTE — Patient Outreach (Signed)
[facility]) Care Management  [place]  02/27/2020   Bradley Davila June 04, 1961 [id number]  Subjective: Telephone call to patient for disease management follow up. Patient reports doing good.  He states that he has been managing his sugars with blood sugar this morning being 124.  Last A1c was 7.9.  Discussed with patient importance of limiting starchy foods such as rice, potatoes, pasta,and breads to help control sugars. He verbalized understanding and voices no concerns.    Objective:   Encounter Medications:  Outpatient Encounter Medications as of 02/27/2020  Medication Sig  . acetaminophen (TYLENOL) 325 MG tablet You can take 2 tablets every 6 hours as needed for pain. Use this first then the prescribed pain medication.   DO NOT TAKE MORE THAN 4000 MG OF TYLENOL PER DAY.  IT CAN HARM YOUR LIVER.  Marland Kitchen atorvastatin (LIPITOR) 20 MG tablet Take 1 tablet (20 mg total) by mouth daily.  Marland Kitchen EPINEPHrine 0.3 mg/0.3 mL IJ SOAJ injection Inject 0.3 mg into the muscle as needed.  . Insulin Glargine (LANTUS SOLOSTAR) 100 UNIT/ML Solostar Pen Inject 18 Units into the skin daily.  Marland Kitchen latanoprost (XALATAN) 0.005 % ophthalmic solution   . levETIRAcetam (KEPPRA) 1000 MG tablet Take 1 tablet (1,000 mg total) by mouth 2 (two) times daily.  Marland Kitchen lisinopril (ZESTRIL) 2.5 MG tablet Take 1 tablet (2.5 mg total) by mouth daily.  . meloxicam (MOBIC) 15 MG tablet TAKE 1 TABLET EVERY DAY WITH FOOD AS NEEDED FOR PAIN  . metFORMIN (GLUCOPHAGE-XR) 500 MG 24 hr tablet TAKE 2-4 TABLETS (1,000-2,000 MG TOTAL) BY MOUTH DAILY WITH BREAKFAST.  Marland Kitchen oxcarbazepine (TRILEPTAL) 600 MG tablet Take 1 tablet (600 mg total) by mouth 2 (two) times daily.  . TRUE METRIX BLOOD GLUCOSE TEST test strip USE AS DIRECTED  TO TEST BLOOD SUGAR ONE TIME DAILY  OR AS NEEDED  . TRUEPLUS LANCETS 33G MISC Use as instructed to test blood sugar once daily or as needed.  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   No  facility-administered encounter medications on file as of 02/27/2020.    Functional Status:  In your present state of health, do you have any difficulty performing the following activities: 07/01/2019  Hearing? 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 Y  Comment Wife assists  Walking or climbing stairs? N  Dressing or bathing? N  Doing errands, shopping? Y  Comment wife assists  Conservation officer, nature and eating ? N  Using the Toilet? N  In the past six months, have you accidently leaked urine? N  Do you have problems with loss of bowel control? N  Managing your Medications? Y  Comment wife manages  Managing your Finances? Y  Comment wife manages due to [employer] or managing your Housekeeping? N  Some recent data might be hidden    Fall/Depression Screening: Fall Risk  02/27/2020 11/28/2019 10/31/2019  Falls in the past year? 0 0 0  Number falls in past yr: - - -  Follow up - - Falls evaluation completed   PHQ 2/9 Scores 02/27/2020 11/09/2019 07/01/2019 06/10/2019 10/26/2018 10/16/2017 10/05/2013  PHQ - 2 Score 1 2 1 1 3 2  0  PHQ- 9 Score - 10 - - 8 10 -    Assessment: Patient continues to manage chronic illnesses and benefits from disease management support.    Plan:  [facility] CM Care Plan Problem One  Most Recent Value  Care Plan Problem One Knowledge of Self care managment of Diabetes control   Role Documenting the Problem One Care Management Telephonic [place] for Problem One Active  [facility] Long Term Goal  Patient will be able to report maintaining A1c at goal of less than 8 within the next 90 days.    [facility] Long Term Goal Start Date 02/27/20  Interventions for Problem One Long Term Goal Discussed blood sugar control with patient. Bllod sugar this morning was 124.  Last A1c 7.9.  Discussed with patient really watching the starchy foods that he consumes as these run his blood sugar up.        RN CM will contact patient in the month of September and patient agreeable.

## 2020-03-01 ENCOUNTER — Telehealth: Payer: Self-pay | Admitting: Family Medicine

## 2020-03-01 NOTE — Telephone Encounter (Signed)
Called patient to get him scheduled for DM 2 f/u and A1C check, he will call us back asap to get put on schedule.

## 2020-03-17 ENCOUNTER — Other Ambulatory Visit: Payer: Self-pay | Admitting: Family Medicine

## 2020-03-27 ENCOUNTER — Ambulatory Visit: Payer: Medicare HMO | Admitting: Family Medicine

## 2020-03-27 ENCOUNTER — Telehealth: Payer: Self-pay

## 2020-03-27 NOTE — Telephone Encounter (Signed)
Per appt notes Alice at front desk has already cancelled and rescheduled appt.

## 2020-03-27 NOTE — Telephone Encounter (Signed)
[place] Night - Client Nonclinical Telephone Record AccessNurse Client [facility] Night - Client Client Site [place] - Night Physician AA - PHYSICIAN, Verita Schneiders- MD Contact Type Call Who Is Calling Patient / Member / Family / Caregiver Caller Name Osby Sweetin Caller Phone Number [phone] Patient Name Bradley Davila Patient DOB 05/21/1961 Call Type Message Only Information Provided Reason for Call Request to [place] Appointment Initial Comment Caller wants to cancel today's appointment Additional Comment Office hours provided. Disp. Time Disposition Final User 03/27/2020 7:55:04 AM General Information Provided Yes Jaclyn Prime Call Closed By: Jaclyn Prime Transaction Date/Time: 03/27/2020 7:53:06 AM (ET)

## 2020-04-10 ENCOUNTER — Ambulatory Visit (INDEPENDENT_AMBULATORY_CARE_PROVIDER_SITE_OTHER): Payer: Medicare HMO | Admitting: Family Medicine

## 2020-04-10 ENCOUNTER — Encounter: Payer: Self-pay | Admitting: Family Medicine

## 2020-04-10 ENCOUNTER — Other Ambulatory Visit: Payer: Self-pay

## 2020-04-10 VITALS — BP 148/98 | HR 101 | Temp 96.3°F | Ht 66.0 in | Wt 221.4 lb

## 2020-04-10 DIAGNOSIS — R4586 Emotional lability: Secondary | ICD-10-CM | POA: Diagnosis not present

## 2020-04-10 DIAGNOSIS — E118 Type 2 diabetes mellitus with unspecified complications: Secondary | ICD-10-CM

## 2020-04-10 DIAGNOSIS — E1165 Type 2 diabetes mellitus with hyperglycemia: Secondary | ICD-10-CM

## 2020-04-10 DIAGNOSIS — Z1211 Encounter for screening for malignant neoplasm of colon: Secondary | ICD-10-CM

## 2020-04-10 DIAGNOSIS — E119 Type 2 diabetes mellitus without complications: Secondary | ICD-10-CM | POA: Diagnosis not present

## 2020-04-10 DIAGNOSIS — IMO0002 Reserved for concepts with insufficient information to code with codable children: Secondary | ICD-10-CM

## 2020-04-10 LAB — POCT GLYCOSYLATED HEMOGLOBIN (HGB A1C): Hemoglobin A1C: 7.6 % — AB (ref 4.0–5.6)

## 2020-04-10 MED ORDER — METFORMIN HCL ER 500 MG PO TB24: ORAL_TABLET | ORAL | Status: DC

## 2020-04-10 NOTE — Progress Notes (Signed)
This visit occurred during the SARS-CoV-2 public health emergency.  Safety protocols were in place, including screening questions prior to the visit, additional usage of staff PPE, and extensive cleaning of exam room while observing appropriate contact time as indicated for disinfecting solutions.  Diabetes:  Using medications without difficulties: yes Hypoglycemic episodes: rare, cautions d/w pt.   Hyperglycemic episodes:no Feet problems: he has h/o abnormal temperature awareness (ie hot/cold water after prev CVA), only on R foot/leg, not B feet, cautions d/w pt.   Blood Sugars averaging: 100-140 eye exam within last year: yes A1c 7.6.    Colon cancer screening d/w pt.  I told the patient I would update GI clinic to see about colon cancer screening.  They need to call his wife.    R tibial tubercle prev puffy.  Not ttp now.  Able to bear weight.  He is having trouble swallowing lisinopril due to the size of the pill.  I told him I will check on options about this.  His mood is lower.  His brother is dealing with dealing with metastatic cancer.  No SI/HI.  We talked about options.  I asked him to think about some combination of counseling vs medicine.  We talked about his speech concerns with expression with counseling.    He had his covid vaccine. D/w pt.  I thanked him for his effort.    Meds, vitals, and allergies reviewed.   ROS: Per HPI unless specifically indicated in ROS section   GEN: nad, alert and oriented HEENT: ncat, speech at baseline. NECK: supple w/o LA CV: rrr. PULM: ctab, no inc wob ABD: soft, +bs EXT: no edema SKIN: no acute rash but chronic eczema changes on the hands  At least 30 minutes were devoted to patient care in this encounter (this can potentially include time spent reviewing the patient's file/history, interviewing and examining the patient, counseling/reviewing plan with patient, ordering referrals, ordering tests, reviewing relevant laboratory or x-ray  data, and documenting the encounter).

## 2020-04-10 NOTE — Patient Instructions (Addendum)
Thank you for your effort.    Let me see about options on the lisinopril.  I'll update the GI clinic.  Let me know if you want to go to counseling vs start treatment otherwise.   Ice your knee as needed.   Take care.  Glad to see you.  Recheck in about 4 months with A1c at the visit.

## 2020-04-11 DIAGNOSIS — Z1211 Encounter for screening for malignant neoplasm of colon: Secondary | ICD-10-CM | POA: Insufficient documentation

## 2020-04-11 NOTE — Assessment & Plan Note (Addendum)
A1c 7.6.  I thanked him for his effort.  No change in medications at this point.  He had his Covid vaccine.  I thanked him for getting that done.  Discussed.  Recheck periodically.  See after visit summary.  He agrees.  He is having trouble swallowing lisinopril due to the size of the pill.  I told him I will check on options about this.

## 2020-04-11 NOTE — Assessment & Plan Note (Signed)
Colon cancer screening d/w pt.  I told the patient I would update GI clinic to see about colon cancer screening.  They need to call his wife.

## 2020-04-11 NOTE — Assessment & Plan Note (Signed)
His mood is lower.  His brother is dealing with dealing with metastatic cancer.  No SI/HI.  We talked about options.  I asked him to think about some combination of counseling vs medicine.  We talked about his speech concerns with expression with counseling.  He will consider options and he will let me know which way he wants to go.  Still okay for outpatient follow-up.

## 2020-04-13 ENCOUNTER — Other Ambulatory Visit: Payer: Self-pay | Admitting: Family Medicine

## 2020-04-13 NOTE — Progress Notes (Unsigned)
Please check with patient.  I called local pharmacy.  Lisinopril isn't available in capsule.  However, ramipril 1.25mg  does come in a capsule.  We can change to ramipril and I can send locally to see if he can tolerate it and swallow it more easily.  Please let me know.  He wouldn't taper off lisinopril, he would change from one med to the other.  Thanks.    04/13/2020   Patient advised and would like to change to the Ramipril.  Please send to [facility] [place].   Mike Craze, CMA  04/15/20  Sent. Thanks.

## 2020-04-15 MED ORDER — RAMIPRIL 1.25 MG PO CAPS: 1.2500 mg | ORAL_CAPSULE | Freq: Every day | ORAL | 3 refills | Status: DC

## 2020-04-19 ENCOUNTER — Telehealth: Payer: Self-pay

## 2020-04-19 NOTE — Telephone Encounter (Signed)
Pt scheduled to see Dr. Henrene Pastor 9/10@8 :20am. Left message for pts wife to call back.

## 2020-04-19 NOTE — Telephone Encounter (Signed)
-----   Message from Irene Shipper, MD sent at 04/12/2020  9:03 AM EDT ----- Vaughan Basta, Please call patient's wife and schedule her and him to come for a routine office visit with either myself or one of the advanced practitioners. JP ----- Message ----- From: Tonia Ghent, MD Sent: 04/12/2020   8:56 AM EDT To: Irene Shipper, MD  Yes, expectancy >10 years.  Please see about getting him scheduled with his wife.  Thanks.    Brigitte Pulse ----- Message ----- From: Irene Shipper, MD Sent: 04/12/2020   8:32 AM EDT To: Tonia Ghent, MD  Lindon Romp,  Thanks for reaching out. He's actually a surveillance exam (history of multiple adenomas), not screening. Thus, colonoscopy would be the only appropriate option. However, if his health status is poor, then it may not be appropriate. If you think that his life expectancy is greater than 10 years, then he can schedule an office visit to discuss. He came to the office with his wife in 2015. Let me know. Thanks   John   ----- Message ----- From: Tonia Ghent, MD Sent: 04/11/2020  11:55 PM EDT To: Irene Shipper, MD  This patient is due for follow-up colon cancer screening but he has multiple health concerns and he also has difficulty communicating on the phone.  Can you please have your staff talk with his wife about options for screening.  I do not know if a colonoscopy would be most appropriate and I need your input.  Many thanks.  Brigitte Pulse

## 2020-04-20 NOTE — Telephone Encounter (Signed)
Spoke with pts wife and she is aware of appt.

## 2020-05-25 ENCOUNTER — Ambulatory Visit: Payer: Medicare HMO | Admitting: Internal Medicine

## 2020-05-28 ENCOUNTER — Other Ambulatory Visit: Payer: Self-pay

## 2020-05-28 NOTE — Patient Outreach (Signed)
[facility]) Care Management  [place]  05/28/2020   Bradley Davila [birth year] [id number]  Subjective: Telephone call to patient for disease management follow up. Patient repots doing good. Patient last A1c was 7.6.  Discussed diabetes management and importance of limiting carbohydrates such as sweets, rice, potatoes, bread and pastas. He verbalized understanding and voices no concerns.    Objective:   Encounter Medications:  Outpatient Encounter Medications as of 05/28/2020  Medication Sig  . acetaminophen (TYLENOL) 325 MG tablet You can take 2 tablets every 6 hours as needed for pain. Use this first then the prescribed pain medication.   DO NOT TAKE MORE THAN 4000 MG OF TYLENOL PER DAY.  IT CAN HARM YOUR LIVER.  Marland Kitchen atorvastatin (LIPITOR) 20 MG tablet Take 1 tablet (20 mg total) by mouth daily.  Marland Kitchen EPINEPHrine 0.3 mg/0.3 mL IJ SOAJ injection Inject 0.3 mg into the muscle as needed.  . Insulin Glargine (LANTUS SOLOSTAR) 100 UNIT/ML Solostar Pen Inject 18 Units into the skin daily.  Marland Kitchen latanoprost (XALATAN) 0.005 % ophthalmic solution   . levETIRAcetam (KEPPRA) 1000 MG tablet Take 1 tablet (1,000 mg total) by mouth 2 (two) times daily.  . meloxicam (MOBIC) 15 MG tablet TAKE 1 TABLET EVERY DAY WITH FOOD AS NEEDED FOR PAIN  . metFORMIN (GLUCOPHAGE-XR) 500 MG 24 hr tablet TAKE 2 TABLETS (1,000mg ) BY MOUTH DAILY TWICE A DAY.  Marland Kitchen oxcarbazepine (TRILEPTAL) 600 MG tablet Take 1 tablet (600 mg total) by mouth 2 (two) times daily.  . ramipril (ALTACE) 1.25 MG capsule Take 1 capsule (1.25 mg total) by mouth daily.  . TRUE METRIX BLOOD GLUCOSE TEST test strip USE AS DIRECTED  TO TEST BLOOD SUGAR 3 TIMES DAILY AS NEEDED  . TRUEPLUS LANCETS 33G MISC Use as instructed to test blood sugar once daily or as needed.  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   No facility-administered encounter medications on file as of 05/28/2020.    Functional Status:  In your present state of  health, do you have any difficulty performing the following activities: 05/28/2020 07/01/2019  Hearing? 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 N N  Dressing or bathing? N N  Doing errands, shopping? Tempie Donning  Comment wife assists wife assists  Conservation officer, nature and eating ? N N  Using the Toilet? N N  In the past six months, have you accidently leaked urine? N N  Do you have problems with loss of bowel control? N N  Managing your Medications? Tempie Donning  Comment wife handles wife manages  Managing your Finances? Tempie Donning  Comment wife handles wife manages due to stroke/seizures  Housekeeping or managing your Housekeeping? - N  Some recent data might be hidden    Fall/Depression Screening: Fall Risk  05/28/2020 02/27/2020 11/28/2019  Falls in the past year? 0 0 0  Number falls in past yr: - - -  Follow up - - -   PHQ 2/9 Scores 05/28/2020 02/27/2020 11/09/2019 07/01/2019 06/10/2019 10/26/2018 10/16/2017  PHQ - 2 Score 0 1 2 1 1 3 2   PHQ- 9 Score - - 10 - - 8 10    Assessment: Patient continues to manage chronic conditions and benefits from disease management follow up.    Plan:  [place]  Plan Problem One     Most Recent Value  Care Plan Problem One Knowledge of Self care managment of Diabetes control   Role Documenting the Problem One Care Management Telephonic Coordinator  Care Plan for Problem One Active  [facility] Long Term Goal  Patient will be able to report maintaining A1c at goal of less than 8 within the next 90 days.    [facility] Long Term Goal Start Date 05/28/20  Interventions for Problem One Long Term Goal Reviewed with patient importance of diabetes control.  Last A1c 7.6.  Reiterated the importance of limiting breads potatoes, rice and pastas.     RN CM will contact patient again in the month of December and patient  agreeable.  [name], RN, MSN [place] Management Care Management Coordinator Direct Line [phone] Cell [phone] Toll Free: [phone]  Fax: [phone]

## 2020-06-10 IMAGING — DX DG CERVICAL SPINE COMPLETE 4+V
6 series · 6 of 6 positions shown · non-contrast
Comparison: None.

CLINICAL DATA: Right arm pain.

EXAM:
CERVICAL SPINE - COMPLETE 4+ VIEW

[c-spine lat]
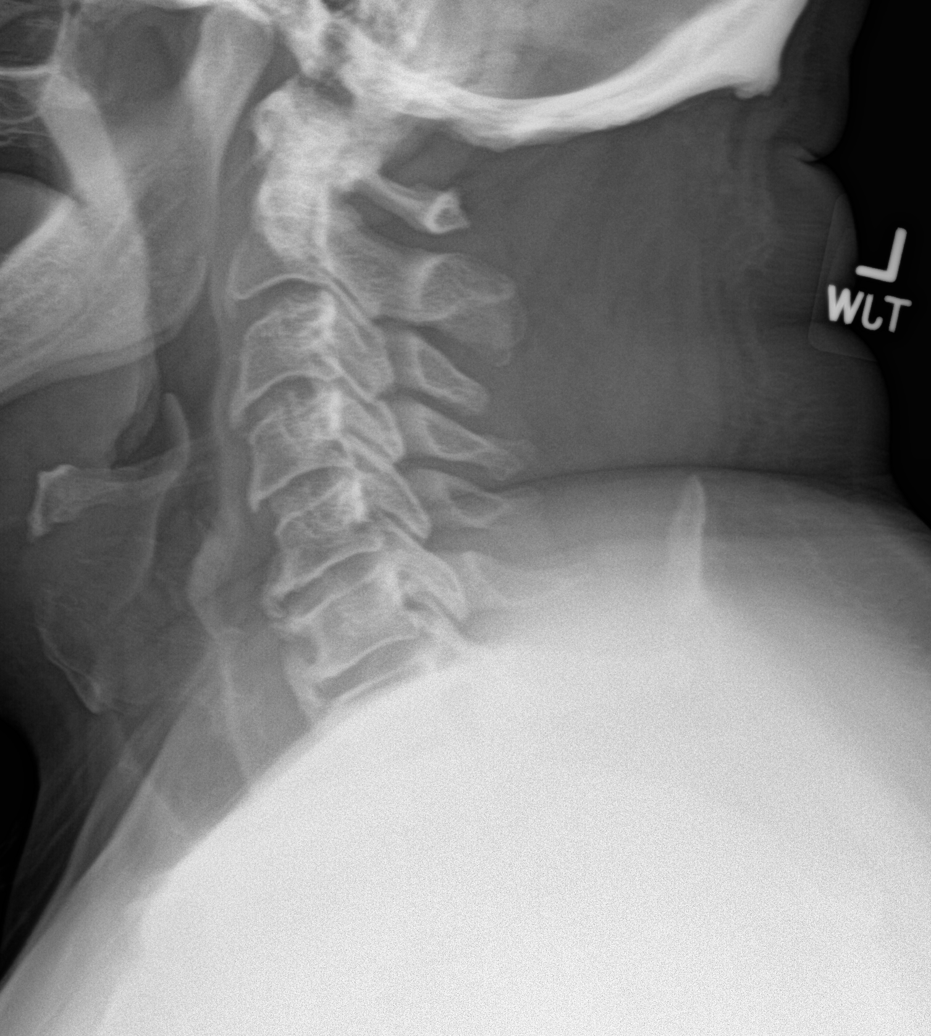

[c-spine obl (1 of 2)]
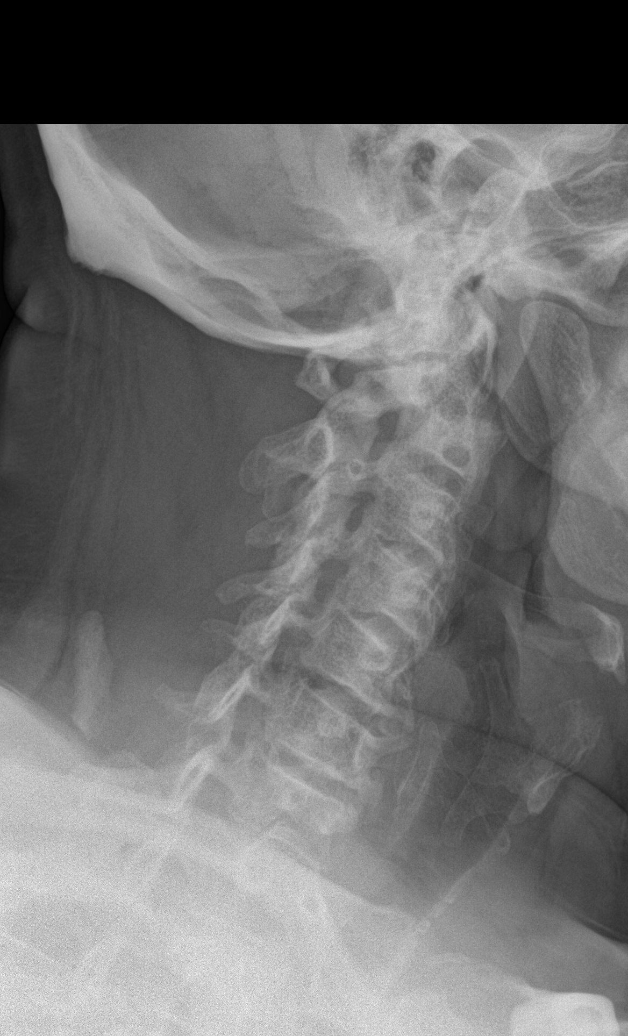

[c-spine obl (2 of 2)]
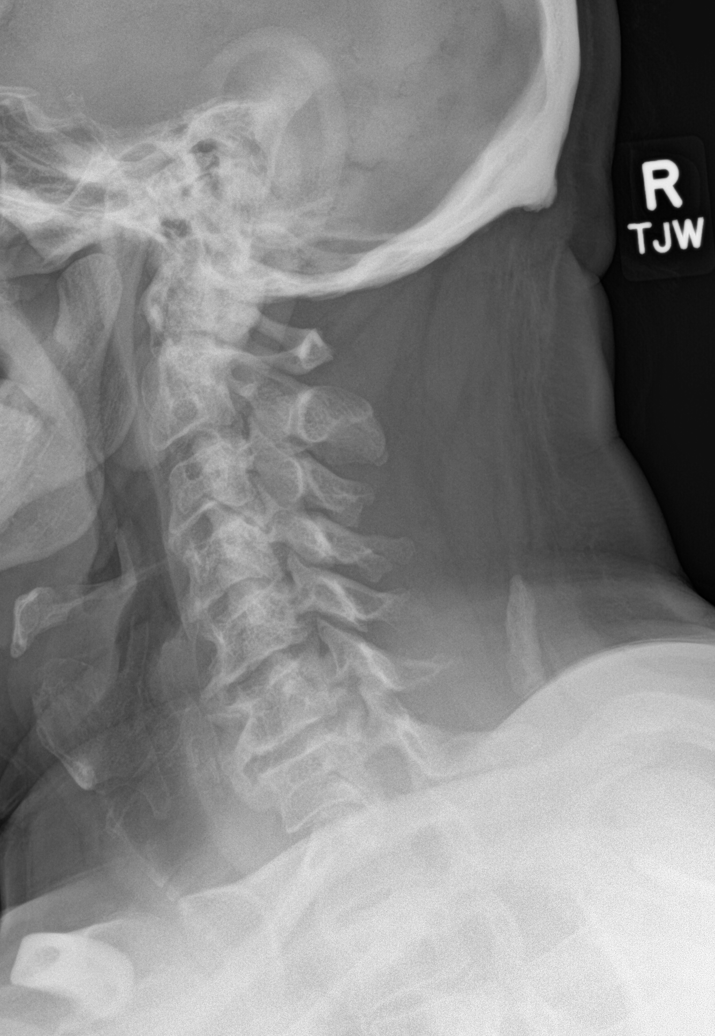

[c-spine ap]
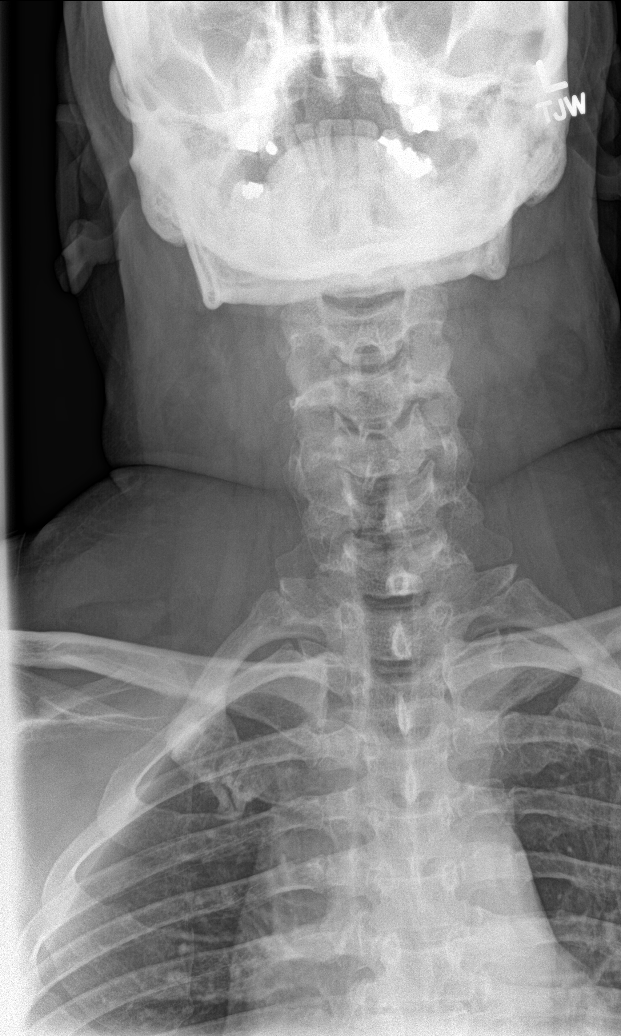

[c-spine open mouth]
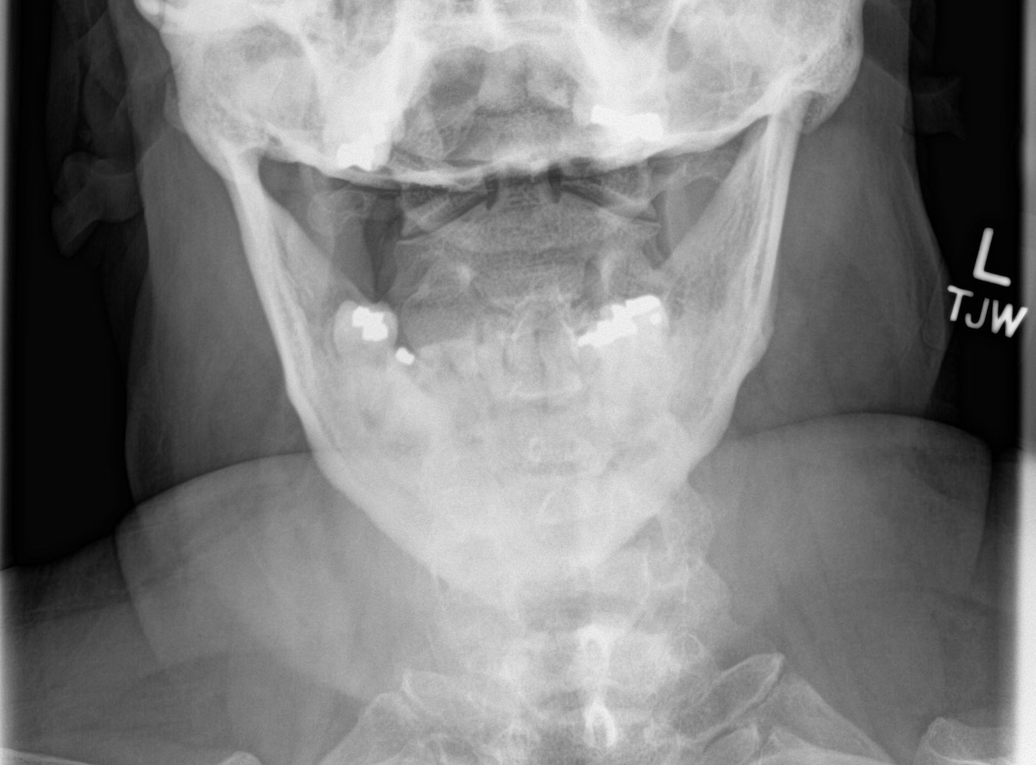

[c-spine swimmers]
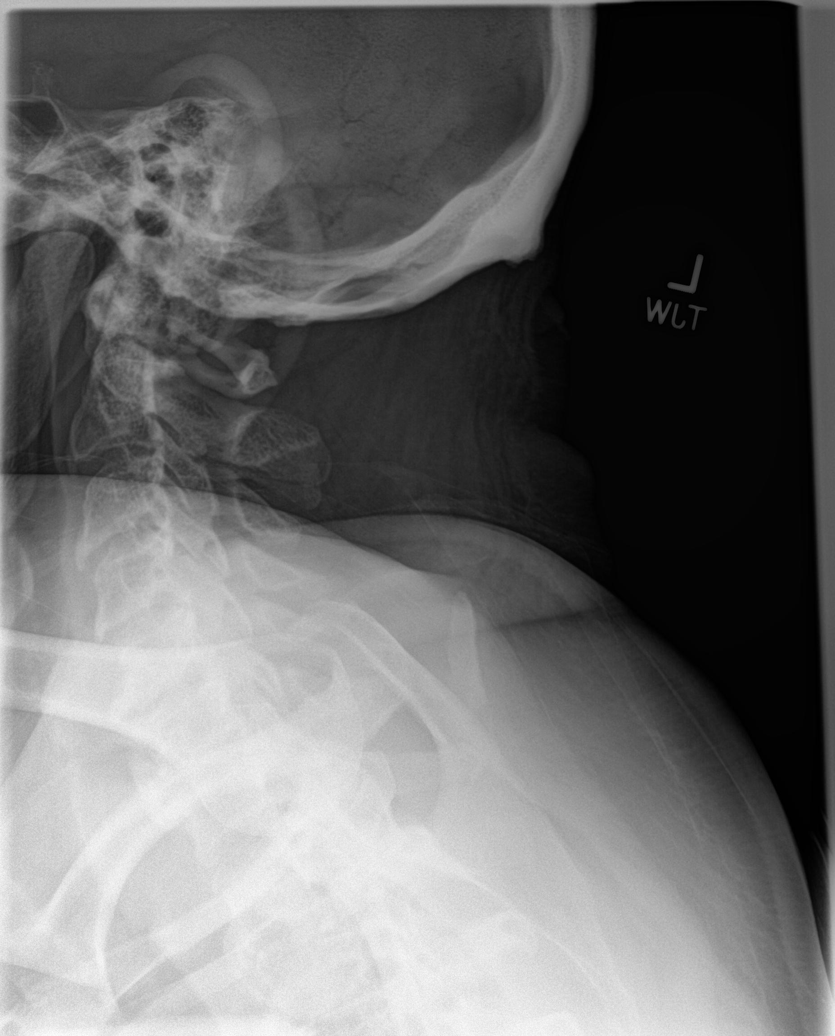

[6 of 6 positions shown; findings below may reference images not displayed]

FINDINGS: There is no evidence of cervical spine fracture or prevertebral soft
tissue swelling. Alignment is normal. Degenerative joint changes
with narrowed joint space and osteophyte formation are identified
throughout cervical spine. There are narrowing of the bilateral
C3-4, C4-5, C5-6 neural foramina due to osteophyte encroachment.
IMPRESSION: Degenerative joint changes of cervical spine.

## 2020-07-27 ENCOUNTER — Encounter: Payer: Self-pay | Admitting: Family Medicine

## 2020-07-29 ENCOUNTER — Encounter: Payer: Self-pay | Admitting: Family Medicine

## 2020-07-29 NOTE — Telephone Encounter (Signed)
Please make sure his letter printed so I can sign it.  Please send to patient.  Thanks.

## 2020-07-30 NOTE — Telephone Encounter (Signed)
Letter mailed to patient.

## 2020-08-14 ENCOUNTER — Telehealth: Payer: Self-pay

## 2020-08-14 MED ORDER — TRUE METRIX METER W/DEVICE KIT: 1.0000 | PACK | Freq: Once | 0 refills | Status: AC

## 2020-08-14 NOTE — Telephone Encounter (Signed)
Rx sent electronically.  

## 2020-08-24 ENCOUNTER — Other Ambulatory Visit: Payer: Self-pay

## 2020-08-24 NOTE — Patient Instructions (Signed)
Goals Addressed            This Visit's Progress   . [facility]-Make and Keep All Appointments       Timeframe:  Long-Range Goal Priority:  High Start Date:   08/24/20                          Expected End Date:  12/13/20                       - keep a calendar with appointment dates    Why is this important?    Part of staying healthy is seeing the doctor for follow-up care.   If you forget your appointments, there are some things you can do to stay on track.    Notes:     . [facility]-Monitor and Manage My Blood Sugar-Diabetes Type 2       Timeframe:  Long-Range Goal Priority:  High Start Date:    12/10//21                         Expected End Date:  12/13/20                       - check blood sugar at prescribed times - check blood sugar if I feel it is too high or too low - take the blood sugar log to all doctor visits    Why is this important?    Checking your blood sugar at home helps to keep it from getting very high or very low.   Writing the results in a diary or log helps the doctor know how to care for you.   Your blood sugar log should have the time, date and the results.   Also, write down the amount of insulin or other medicine that you take.   Other information, like what you ate, exercise done and how you were feeling, will also be helpful.     Notes:

## 2020-08-24 NOTE — Patient Outreach (Signed)
[facility]) Care Management  [place]  08/24/2020   Bradley Davila 1961/03/28 [id number]  Subjective: Telephone call to patient for disease management support. Patient reports doing fine. He states that his sugars are fine but with the holidays he has not stayed on his diet as much,  His last sugar was 164.  Discussed holidays and diet with diabetes and encouraged patient to try to stay on track.  He verbalized understanding.    Objective:   Encounter Medications:  Outpatient Encounter Medications as of 08/24/2020  Medication Sig  . acetaminophen (TYLENOL) 325 MG tablet You can take 2 tablets every 6 hours as needed for pain. Use this first then the prescribed pain medication.   DO NOT TAKE MORE THAN 4000 MG OF TYLENOL PER DAY.  IT CAN HARM YOUR LIVER.  Marland Kitchen atorvastatin (LIPITOR) 20 MG tablet Take 1 tablet (20 mg total) by mouth daily.  Marland Kitchen EPINEPHrine 0.3 mg/0.3 mL IJ SOAJ injection Inject 0.3 mg into the muscle as needed.  . Insulin Glargine (LANTUS SOLOSTAR) 100 UNIT/ML Solostar Pen Inject 18 Units into the skin daily.  Marland Kitchen latanoprost (XALATAN) 0.005 % ophthalmic solution   . levETIRAcetam (KEPPRA) 1000 MG tablet Take 1 tablet (1,000 mg total) by mouth 2 (two) times daily.  . meloxicam (MOBIC) 15 MG tablet TAKE 1 TABLET EVERY DAY WITH FOOD AS NEEDED FOR PAIN  . metFORMIN (GLUCOPHAGE-XR) 500 MG 24 hr tablet TAKE 2 TABLETS (1,000mg ) BY MOUTH DAILY TWICE A DAY.  Marland Kitchen oxcarbazepine (TRILEPTAL) 600 MG tablet Take 1 tablet (600 mg total) by mouth 2 (two) times daily.  . ramipril (ALTACE) 1.25 MG capsule Take 1 capsule (1.25 mg total) by mouth daily.  . TRUE METRIX BLOOD GLUCOSE TEST test strip USE AS DIRECTED  TO TEST BLOOD SUGAR 3 TIMES DAILY AS NEEDED  . TRUEPLUS LANCETS 33G MISC Use as instructed to test blood sugar once daily or as needed.  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   No facility-administered encounter medications on file as of 08/24/2020.     Functional Status:  In your present state of health, do you have any difficulty performing the following activities: 05/28/2020  Hearing? 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 N  Dressing or bathing? N  Doing errands, shopping? Y  Comment wife assists  Conservation officer, nature and eating ? N  Using the Toilet? N  In the past six months, have you accidently leaked urine? N  Do you have problems with loss of bowel control? N  Managing your Medications? Y  Comment wife handles  Managing your Finances? Y  Comment wife handles  Some recent data might be hidden    Fall/Depression Screening: Fall Risk  05/28/2020 02/27/2020 11/28/2019  Falls in the past year? 0 0 0  Number falls in past yr: - - -  Follow up - - -   PHQ 2/9 Scores 05/28/2020 02/27/2020 11/09/2019 07/01/2019 06/10/2019 10/26/2018 10/16/2017  PHQ - 2 Score 0 1 2 1 1 3 2   PHQ- 9 Score - - 10 - - 8 10    Assessment: Patient continues to manage chronic conditions and see physicians as scheduled.   Goals Addressed            This Visit's Progress   . [facility]-Make and Keep All Appointments  Timeframe:  Long-Range Goal Priority:  High Start Date:   08/24/20                          Expected End Date:  12/13/20                       - keep a calendar with appointment dates    Why is this important?    Part of staying healthy is seeing the doctor for follow-up care.   If you forget your appointments, there are some things you can do to stay on track.    Notes:     . [facility]-Monitor and Manage My Blood Sugar-Diabetes Type 2       Timeframe:  Long-Range Goal Priority:  High Start Date:    12/10//21                         Expected End Date:  12/13/20                       - check blood sugar at prescribed times - check blood sugar if I feel it is too high or too low - take the blood sugar log to  all doctor visits    Why is this important?    Checking your blood sugar at home helps to keep it from getting very high or very low.   Writing the results in a diary or log helps the doctor know how to care for you.   Your blood sugar log should have the time, date and the results.   Also, write down the amount of insulin or other medicine that you take.   Other information, like what you ate, exercise done and how you were feeling, will also be helpful.     Notes:        Plan: RN CM will contact in the month of March. Follow-up:  Patient agrees to Care Plan and Follow-up.   [name], RN, MSN [place] Management Care Management Coordinator Direct Line [phone] Cell [phone] Toll Free: [phone]  Fax: [phone]

## 2020-08-27 ENCOUNTER — Ambulatory Visit: Payer: Self-pay

## 2020-10-01 ENCOUNTER — Ambulatory Visit: Payer: Medicare HMO | Admitting: Family Medicine

## 2020-10-09 ENCOUNTER — Ambulatory Visit: Payer: Medicare HMO | Admitting: Family Medicine

## 2020-10-12 ENCOUNTER — Other Ambulatory Visit: Payer: Self-pay

## 2020-10-15 ENCOUNTER — Ambulatory Visit (INDEPENDENT_AMBULATORY_CARE_PROVIDER_SITE_OTHER): Payer: Medicare HMO | Admitting: Family Medicine

## 2020-10-15 ENCOUNTER — Other Ambulatory Visit: Payer: Self-pay

## 2020-10-15 ENCOUNTER — Encounter: Payer: Self-pay | Admitting: Family Medicine

## 2020-10-15 VITALS — BP 118/82 | HR 84 | Temp 97.2°F | Ht 66.0 in | Wt 215.0 lb

## 2020-10-15 DIAGNOSIS — IMO0002 Reserved for concepts with insufficient information to code with codable children: Secondary | ICD-10-CM

## 2020-10-15 DIAGNOSIS — E1165 Type 2 diabetes mellitus with hyperglycemia: Secondary | ICD-10-CM | POA: Diagnosis not present

## 2020-10-15 DIAGNOSIS — E118 Type 2 diabetes mellitus with unspecified complications: Secondary | ICD-10-CM

## 2020-10-15 LAB — POCT GLYCOSYLATED HEMOGLOBIN (HGB A1C): Hemoglobin A1C: 7.3 % — AB (ref 4.0–5.6)

## 2020-10-15 MED ORDER — LANTUS SOLOSTAR 100 UNIT/ML ~~LOC~~ SOPN: 18.0000 [IU] | PEN_INJECTOR | Freq: Every day | SUBCUTANEOUS | Status: DC

## 2020-10-15 NOTE — Patient Instructions (Addendum)
Stop metformin and gradually add 1 unit of insulin a day if needed.   If possible try to get labs ahead of a physical in about 3-4 months.  Take care.  Glad to see you.

## 2020-10-15 NOTE — Progress Notes (Signed)
This visit occurred during the SARS-CoV-2 public health emergency.  Safety protocols were in place, including screening questions prior to the visit, additional usage of staff PPE, and extensive cleaning of exam room while observing appropriate contact time as indicated for disinfecting solutions.  Diabetes:  Using medications without difficulties: he is on XR metformin but still having diarrhea.   Hypoglycemic episodes: no Hyperglycemic episodes: no Feet problems: some tingling on the R foot.   Blood Sugars averaging: 140 this AM.   Has been lower other AMs.   eye exam within last year: yes A1c improved.  Discussed with patient at office visit.  His brother died and his aunt is on hospice care.  Condolences offered.  He had covid at [employer].  He is better in the last 10 days but he had cough for a few weeks.  He had been vaccinated, and I am glad he had gotten that done.  We talked about that.  Scraped R knee, superficial abrasion, dressed at [place].  Does not appear infected.  Hand eczema better with gold bond lotion.    Meds, vitals, and allergies reviewed.   ROS: Per HPI unless specifically indicated in ROS section   GEN: nad, alert and oriented HEENT: ncat NECK: supple w/o LA CV: rrr. PULM: ctab, no inc wob ABD: soft, +bs EXT: no edema SKIN: well perfused Resolving rash on the hands noted.  He has a superficial small abrasion on the right knee near the patella, dressed with nonstick bandage at the office visit.  Able to bear weight.  No bruising.

## 2020-10-17 NOTE — Assessment & Plan Note (Signed)
A1c improved.  Intolerant of Metformin.  Having diarrhea. Stop metformin and gradually add 1 unit of insulin a day if needed.   If possible try to get labs ahead of a physical in about 3-4 months.  He agrees with plan.  I thanked him for his effort.

## 2020-11-14 ENCOUNTER — Telehealth: Payer: Self-pay | Admitting: Family Medicine

## 2020-11-14 NOTE — Progress Notes (Signed)
  Chronic Care Management   Outreach Note  11/14/2020 Name: Bradley Davila MRN: [id number] DOB: 1961/07/16  Referred by: Tonia Ghent, MD Reason for referral : Chronic Care Management   An unsuccessful telephone outreach was attempted today. The patient was referred to the pharmacist for assistance with care management and care coordination.   Follow Up Plan:  Hilario Quarry  Upstream Scheduler

## 2020-11-23 ENCOUNTER — Other Ambulatory Visit: Payer: Self-pay | Admitting: Family Medicine

## 2020-11-23 ENCOUNTER — Other Ambulatory Visit: Payer: Self-pay

## 2020-11-23 NOTE — Patient Outreach (Addendum)
[place] [facility]) Care Management  [place]  11/23/2020   Bradley Davila Nov 29, 1960 [id number]  Subjective: Telephone call to patient for follow up disease management. Patient reports he is good but his sugars are higher due to stopping one diabetic medication. He states however he is adjusting his insulin.  Today's sugar was 212.  Discussed insulin adjustments and notifying physician if sugars do not improve and risk of co morbidities with higher blood sugars.    He verbalized understanding.  Objective:   Encounter Medications:  Outpatient Encounter Medications as of 11/23/2020  Medication Sig  . acetaminophen (TYLENOL) 325 MG tablet You can take 2 tablets every 6 hours as needed for pain. Use this first then the prescribed pain medication.   DO NOT TAKE MORE THAN 4000 MG OF TYLENOL PER DAY.  IT CAN HARM YOUR LIVER.  Marland Kitchen atorvastatin (LIPITOR) 20 MG tablet Take 1 tablet (20 mg total) by mouth daily.  Marland Kitchen EPINEPHrine 0.3 mg/0.3 mL IJ SOAJ injection Inject 0.3 mg into the muscle as needed.  . insulin glargine (LANTUS SOLOSTAR) 100 UNIT/ML Solostar Pen Inject 18-30 Units into the skin daily.  Marland Kitchen latanoprost (XALATAN) 0.005 % ophthalmic solution   . levETIRAcetam (KEPPRA) 1000 MG tablet Take 1 tablet (1,000 mg total) by mouth 2 (two) times daily.  . meloxicam (MOBIC) 15 MG tablet TAKE 1 TABLET EVERY DAY WITH FOOD AS NEEDED FOR PAIN  . oxcarbazepine (TRILEPTAL) 600 MG tablet Take 1 tablet (600 mg total) by mouth 2 (two) times daily.  . ramipril (ALTACE) 1.25 MG capsule Take 1 capsule (1.25 mg total) by mouth daily.  . TRUE METRIX BLOOD GLUCOSE TEST test strip USE AS DIRECTED  TO TEST BLOOD SUGAR 3 TIMES DAILY AS NEEDED  . TRUEPLUS LANCETS 33G MISC Use as instructed to test blood sugar once daily or as needed.  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   No facility-administered encounter medications on file as of 11/23/2020.    Functional Status:  In your present state of  health, do you have any difficulty performing the following activities: 05/28/2020  Hearing? 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 N  Dressing or bathing? N  Doing errands, shopping? Y  Comment wife assists  Conservation officer, nature and eating ? N  Using the Toilet? N  In the past six months, have you accidently leaked urine? N  Do you have problems with loss of bowel control? N  Managing your Medications? Y  Comment wife handles  Managing your Finances? Y  Comment wife handles  Some recent data might be hidden    Fall/Depression Screening: Fall Risk  11/23/2020 10/15/2020 05/28/2020  Falls in the past year? 0 1 0  Number falls in past yr: - 0 -  Injury with Fall? - 1 -  Risk for fall due to : - History of fall(s) -  Follow up - Falls evaluation completed -   PHQ 2/9 Scores 11/23/2020 05/28/2020 02/27/2020 11/09/2019 07/01/2019 06/10/2019 10/26/2018  PHQ - 2 Score 0 0 1 2 1 1 3   PHQ- 9 Score - - - 10 - - 8    Assessment:  Goals Addressed            This Visit's Progress   . [facility]-Make and Keep All Appointments   On track    Timeframe:  Long-Range Goal  Priority:  High Start Date:   08/24/20                          Expected End Date: 05/15/21           Follow up 03/14/21   - arrange a ride through an agency 1 week before appointment - ask family or friend for a ride    Why is this important?    Part of staying healthy is seeing the doctor for follow-up care.   If you forget your appointments, there are some things you can do to stay on track.    Notes: Patient sees physician a scheduled.      . [facility]-Monitor and Manage My Blood Sugar-Diabetes Type 2   On track    Timeframe:  Long-Range Goal Priority:  High Start Date:    12/10//21                         Expected End Date: 05/15/21  Follow up date: 03/14/21                    - enter blood sugar  readings and medication or insulin into daily log - take the blood sugar log to all doctor visits    Why is this important?    Checking your blood sugar at home helps to keep it from getting very high or very low.   Writing the results in a diary or log helps the doctor know how to care for you.   Your blood sugar log should have the time, date and the results.   Also, write down the amount of insulin or other medicine that you take.   Other information, like what you ate, exercise done and how you were feeling, will also be helpful.     Notes: Patient reports blood sugars higher right now with today's reading 212.  Discussed importance of diet. Follow up with physician.       Plan: RN CM will outreach again in the month of June. Follow-up:  Patient agrees to Care Plan and Follow-up.   [name], RN, MSN [place] Management Care Management Coordinator Direct Line [phone] Cell [phone] Toll Free: [phone]  Fax: [phone]

## 2020-11-26 ENCOUNTER — Encounter: Payer: Self-pay | Admitting: Adult Health

## 2020-11-27 MED ORDER — OXCARBAZEPINE 600 MG PO TABS: 600.0000 mg | ORAL_TABLET | Freq: Two times a day (BID) | ORAL | 0 refills | Status: DC

## 2020-12-21 ENCOUNTER — Other Ambulatory Visit: Payer: Self-pay | Admitting: Family Medicine

## 2021-01-02 ENCOUNTER — Other Ambulatory Visit: Payer: Self-pay

## 2021-01-02 ENCOUNTER — Encounter: Payer: Self-pay | Admitting: Adult Health

## 2021-01-02 ENCOUNTER — Ambulatory Visit: Payer: Medicare HMO | Admitting: Adult Health

## 2021-01-02 VITALS — BP 124/81 | HR 73 | Ht 66.0 in | Wt 221.0 lb

## 2021-01-02 DIAGNOSIS — R569 Unspecified convulsions: Secondary | ICD-10-CM

## 2021-01-02 NOTE — Patient Instructions (Signed)
Your Plan:  Continue Keppra and Trileptal  Blood work today If your symptoms worsen or you develop new symptoms please let us know.       Thank you for coming to see [place] at [facility]. I hope we have been able to provide you high quality care today.  You may receive a patient satisfaction survey over the next few weeks. We would appreciate your feedback and comments so that we may continue to improve ourselves and the health of our patients.

## 2021-01-02 NOTE — Progress Notes (Signed)
PATIENT: REDMOND WHITTLEY DOB: [birth year]  REASON FOR VISIT: follow up HISTORY FROM: patient  HISTORY OF PRESENT ILLNESS: Today 01/02/21:  Mr. Mccorkel is a 60 year old male with a history of seizures and cerebral palsy.  He returns today for follow-up.  He is here today with his wife.  She states that last weekend they were at [facility] state visiting the campus for their daughter.  She states that he had an episode in the car where he was staring off.  He was not responsive to stimuli.  She states once the event was over he was back to normal.  He denies missing any medication.  Continues on Keppra 1000 mg twice a day and Trileptal 600 mg twice a day.  He states he has not taken his Keppra dose this morning due to not eating yet.  He returns today for an evaluation.  01/03/20: Mr. Maltos is a 60 year old male with a history of seizures and cerebral palsy.  He returns today for follow-up.  He is currently on Keppra 1000 mg twice a day and Trileptal 600 mg twice a day.  He denies any seizure events.  Continues to tolerate the medications well.  He does not operate a motor vehicle.  Reports in the last week he has noticed some numbness in the left hand.  Also reports that he drops things easily.  His wife thought that he may be slept on his hand the wrong way.  He denies any discomfort of the arm.  He returns today for an evaluation.  HISTORY LUISDANIEL KENTON is a 60 y.o. male and seen here as a referral/ revisit  from Dr. Damita Dunnings for a transfer of care. I have seen Mr. 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 In addition he has diabetes and has developed depression, he underwent a cholecystectomy while still being my patient in 2007. In the meantime he had been followed by my former Environmental manager O'Donovan at [facility], but  would like to return to the general neurology practice here for reasons of proximity, as well as easier emergency care. Dr. Ginny Forth noted that the patient's seizure disorder has been very well controlled and has actually just refilled medications for the last years without any additional diagnostic studies being undertaken. For this reason I will continue his current medications.   I will try to obtain his paper records form years ago- if they still exist.    REVIEW OF SYSTEMS: Out of a complete 14 system review of symptoms, the patient complains only of the following symptoms, and all other reviewed systems are negative.  ALLERGIES: Allergies  Allergen Reactions  . Benadryl [Diphenhydramine Hcl]     Caused seizures.   Jaquelyn Bitter Jacket Venom [Honey Bee Venom]     Facial swelling after sting  . Carbamazepine     Inc in seizures  . Lamotrigine     Inc in seizures  . Metformin And Related     diarrhea  . Phenobarbital     rash    HOME MEDICATIONS: Outpatient Medications Prior to Visit  Medication Sig Dispense Refill  . acetaminophen (TYLENOL) 325 MG tablet You can take 2 tablets every 6 hours as needed for pain. Use this first then the prescribed pain medication.   DO NOT TAKE MORE THAN 4000 MG  OF TYLENOL PER DAY.  IT CAN HARM YOUR LIVER.    Marland Kitchen atorvastatin (LIPITOR) 20 MG tablet TAKE 1 TABLET EVERY DAY 90 tablet 3  . EPINEPHrine 0.3 mg/0.3 mL IJ SOAJ injection Inject 0.3 mg into the muscle as needed.    . insulin glargine (LANTUS SOLOSTAR) 100 UNIT/ML Solostar Pen Inject 18-30 Units into the skin daily.    Marland Kitchen latanoprost (XALATAN) 0.005 % ophthalmic solution     . levETIRAcetam (KEPPRA) 1000 MG tablet Take 1 tablet (1,000 mg total) by mouth 2 (two) times daily. 180 tablet 3  . meloxicam (MOBIC) 15 MG tablet TAKE 1 TABLET EVERY DAY WITH FOOD AS NEEDED FOR PAIN 90 tablet 3  . oxcarbazepine (TRILEPTAL) 600 MG tablet Take 1 tablet (600 mg total) by mouth 2 (two) times daily. 180 tablet  0  . ramipril (ALTACE) 1.25 MG capsule Take 1 capsule (1.25 mg total) by mouth daily. 90 capsule 3  . TRUE METRIX BLOOD GLUCOSE TEST test strip USE AS DIRECTED  TO TEST BLOOD SUGAR ONE TIME DAILY  OR AS NEEDED 100 strip 12  . TRUEPLUS LANCETS 33G MISC Use as instructed to test blood sugar once daily or as needed.  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 100 each 3   No facility-administered medications prior to visit.    PAST MEDICAL HISTORY: Past Medical History:  Diagnosis Date  . Complication of anesthesia    slow to wake up from anesthesia after gall bladder surgery  . 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 Diabetes mellitus without complication ([place])   . Dysphagia as late effect of stroke   . Headache(784.0)   . Hypertension   . Seizures ([place])    h/o grand mal and staring episodes, followed by [facility] neuro, last grand mal July 09, 2013 last seizure  . 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 Stroke [facility])    childhood    PAST SURGICAL HISTORY: Past Surgical History:  Procedure Laterality Date  . CHOLECYSTECTOMY  2007  . CYSTECTOMY     on buttock, s/p removal  . HERNIA REPAIR    . INSERTION OF MESH N/A 08/05/2017   Procedure: INSERTION OF MESH;  Surgeon: Coralie Keens, MD;  Location: [place];  Service: General;  Laterality: N/A;  . TONSILLECTOMY    . UMBILICAL HERNIA REPAIR  08/05/2017  . UMBILICAL HERNIA REPAIR N/A 08/05/2017   Procedure: UMBILICAL HERNIA REPAIR;  Surgeon: Coralie Keens, MD;  Location: [place];  Service: General;  Laterality: N/A;  . WISDOM TOOTH EXTRACTION      FAMILY HISTORY: Family History  Problem Relation Age of Onset  . Hydrocephalus Mother   . Liver cancer Father   . Alcohol abuse Father   . Alcohol abuse Sister   . Heart disease Brother   . Colon cancer Neg Hx   . Prostate cancer Neg Hx     SOCIAL HISTORY: Social History   Socioeconomic History   . Marital status: Married    Spouse name: Clarene Critchley  . Number of children: 1  . Years of education: Not on file  . Highest education level: Not on file  Occupational History    Employer: NOT EMPLOYED  Tobacco Use  . Smoking status: Current Some Day Smoker  . Smokeless tobacco: Never Used  Vaping Use  . Vaping Use: Never used  Substance and Sexual Activity  . Alcohol use: Yes  Comment: rarely - 4 to 5 times per year  . Drug use: No  . Sexual activity: Yes  Other Topics Concern  . Not on file  Social History Narrative   Married 2005, 1 daughter   Disabled due to cva   Social Determinants of Health   Financial Resource Strain: Not on file  Food Insecurity: No Food Insecurity  . Worried About Charity fundraiser in the Last Year: Never true  . Ran Out of Food in the Last Year: Never true  Transportation Needs: No Transportation Needs  . Lack of Transportation (Medical): No  . Lack of Transportation (Non-Medical): No  Physical Activity: Not on file  Stress: Not on file  Social Connections: Not on file  Intimate Partner Violence: Not on file      PHYSICAL EXAM  Vitals:   01/02/21 1121  BP: 124/81  Pulse: 73  Weight: 221 lb (100.2 kg)  Height: 5' 6" (1.676 m)   Body mass index is 35.67 kg/m.  Generalized: Well developed, in no acute distress   Neurological examination  Mentation: Alert oriented to time, place, history taking. Follows all commands.  Speech is slightly garbled Cranial nerve II-XII: Pupils were equal round reactive to light. Extraocular movements were full, visual field were full on confrontational test. Facial sensation and strength were normal. Uvula tongue midline. Head turning and shoulder shrug  were normal and symmetric. Motor: The motor testing reveals 5 over 5 strength of all 4 extremities. Good symmetric motor tone is noted throughout.  Sensory: Sensory testing is intact to soft touch on all 4 extremities. No evidence of extinction is noted.  Tinel test positive on left Coordination: Cerebellar testing reveals good finger-nose-finger and heel-to-shin bilaterally.  Gait and station: Gait is normal.  Reflexes: Deep tendon reflexes are symmetric and normal bilaterally.   DIAGNOSTIC DATA (LABS, IMAGING, TESTING) - I reviewed patient records, labs, notes, testing and imaging myself where available.  Lab Results  Component Value Date   WBC 9.9 01/03/2020   HGB 15.0 01/03/2020   HCT 42.6 01/03/2020   MCV 88 01/03/2020   PLT 373 01/03/2020      Component Value Date/Time   NA 140 01/03/2020 1130   K 5.0 01/03/2020 1130   CL 102 01/03/2020 1130   CO2 21 01/03/2020 1130   GLUCOSE 119 (H) 01/03/2020 1130   GLUCOSE 122 (H) 10/31/2019 1051   BUN 17 01/03/2020 1130   CREATININE 0.95 01/03/2020 1130   CALCIUM 9.9 01/03/2020 1130   PROT 7.2 01/03/2020 1130   ALBUMIN 4.5 01/03/2020 1130   AST 18 01/03/2020 1130   ALT 23 01/03/2020 1130   ALKPHOS 89 01/03/2020 1130   BILITOT <0.2 01/03/2020 1130   GFRNONAA 87 01/03/2020 1130   GFRAA 100 01/03/2020 1130   Lab Results  Component Value Date   CHOL 143 10/31/2019   HDL 43.30 10/31/2019   LDLCALC 69 10/31/2019   LDLDIRECT 171.0 10/26/2018   TRIG 151.0 (H) 10/31/2019   CHOLHDL 3 10/31/2019   Lab Results  Component Value Date   HGBA1C 7.3 (A) 10/15/2020   Lab Results  Component Value Date   IOEVOJJK09 381 10/21/2006   Lab Results  Component Value Date   TSH 1.188 10/21/2006      ASSESSMENT AND PLAN 60 y.o. year old male  has a past medical history of Complication of anesthesia, CVA (cerebral infarction), Diabetes mellitus without complication ([place]), Dysphagia as late effect of stroke, Headache(784.0), Hypertension, Seizures ([place]), Speech and language  deficit as late effect of stroke, and Stroke ([place]). here with:  1.  Seizures  -Continue Keppra 1000 mg BID -Continue  Trileptal 600 mg BID --Discussed increasing his Keppra dose however the patient deferred.  Educated  the patient on taking his medication 12 hours apart and not delaying. -Blood work today -Advised if he has any seizure events he should let us know     I spent 30 minutes of face-to-face and non-face-to-face time with patient.  This included previsit chart review, education on how to take his medicine and seizure precautions.  Ward Givens, MSN, NP-C 01/02/2021, 11:23 AM [facility] [address], [place], [place] [address] [phone]

## 2021-01-05 LAB — COMPREHENSIVE METABOLIC PANEL
ALT: 24 IU/L (ref 0–44)
AST: 19 IU/L (ref 0–40)
Albumin/Globulin Ratio: 1.3 (ref 1.2–2.2)
Albumin: 4.4 g/dL (ref 3.8–4.8)
Alkaline Phosphatase: 101 IU/L (ref 44–121)
BUN/Creatinine Ratio: 11 (ref 10–24)
BUN: 12 mg/dL (ref 8–27)
Bilirubin Total: 0.3 mg/dL (ref 0.0–1.2)
CO2: 24 mmol/L (ref 20–29)
Calcium: 9.8 mg/dL (ref 8.6–10.2)
Chloride: 99 mmol/L (ref 96–106)
Creatinine, Ser: 1.13 mg/dL (ref 0.76–1.27)
Globulin, Total: 3.3 g/dL (ref 1.5–4.5)
Glucose: 136 mg/dL — ABNORMAL HIGH (ref 65–99)
Potassium: 4.7 mmol/L (ref 3.5–5.2)
Sodium: 139 mmol/L (ref 134–144)
Total Protein: 7.7 g/dL (ref 6.0–8.5)
eGFR: 74 mL/min/{1.73_m2} (ref >59)

## 2021-01-05 LAB — CBC WITH DIFFERENTIAL/PLATELET
Basophils Absolute: 0.1 10*3/uL (ref 0.0–0.2)
Basos: 1 %
EOS (ABSOLUTE): 0.1 10*3/uL (ref 0.0–0.4)
Eos: 1 %
Hematocrit: 44.8 % (ref 37.5–51.0)
Hemoglobin: 15.2 g/dL (ref 13.0–17.7)
Immature Grans (Abs): 0.1 10*3/uL (ref 0.0–0.1)
Immature Granulocytes: 1 %
Lymphocytes Absolute: 2.5 10*3/uL (ref 0.7–3.1)
Lymphs: 27 %
MCH: 30.5 pg (ref 26.6–33.0)
MCHC: 33.9 g/dL (ref 31.5–35.7)
MCV: 90 fL (ref 79–97)
Monocytes Absolute: 0.6 10*3/uL (ref 0.1–0.9)
Monocytes: 7 %
Neutrophils Absolute: 5.8 10*3/uL (ref 1.4–7.0)
Neutrophils: 63 %
Platelets: 346 10*3/uL (ref 150–450)
RBC: 4.99 x10E6/uL (ref 4.14–5.80)
RDW: 13.1 % (ref 11.6–15.4)
WBC: 9.1 10*3/uL (ref 3.4–10.8)

## 2021-01-05 LAB — 10-HYDROXYCARBAZEPINE: Oxcarbazepine SerPl-Mcnc: 21 ug/mL (ref 10–35)

## 2021-01-05 LAB — LEVETIRACETAM LEVEL: Levetiracetam Lvl: 14.8 ug/mL (ref 10.0–40.0)

## 2021-01-11 ENCOUNTER — Other Ambulatory Visit: Payer: Self-pay | Admitting: Family Medicine

## 2021-01-11 ENCOUNTER — Encounter: Payer: Self-pay | Admitting: Family Medicine

## 2021-01-15 ENCOUNTER — Encounter: Payer: Self-pay | Admitting: Family Medicine

## 2021-01-22 ENCOUNTER — Ambulatory Visit (INDEPENDENT_AMBULATORY_CARE_PROVIDER_SITE_OTHER): Payer: Medicare HMO | Admitting: Family Medicine

## 2021-01-22 ENCOUNTER — Encounter: Payer: Self-pay | Admitting: Family Medicine

## 2021-01-22 ENCOUNTER — Other Ambulatory Visit: Payer: Self-pay

## 2021-01-22 DIAGNOSIS — E118 Type 2 diabetes mellitus with unspecified complications: Secondary | ICD-10-CM | POA: Diagnosis not present

## 2021-01-22 DIAGNOSIS — E1165 Type 2 diabetes mellitus with hyperglycemia: Secondary | ICD-10-CM

## 2021-01-22 DIAGNOSIS — R066 Hiccough: Secondary | ICD-10-CM

## 2021-01-22 DIAGNOSIS — IMO0002 Reserved for concepts with insufficient information to code with codable children: Secondary | ICD-10-CM

## 2021-01-22 MED ORDER — BACLOFEN 10 MG PO TABS: 5.0000 mg | ORAL_TABLET | Freq: Three times a day (TID) | ORAL | 0 refills | Status: DC | PRN

## 2021-01-22 MED ORDER — PANTOPRAZOLE SODIUM 20 MG PO TBEC: 20.0000 mg | DELAYED_RELEASE_TABLET | Freq: Every day | ORAL | 2 refills | Status: DC

## 2021-01-22 MED ORDER — LANTUS SOLOSTAR 100 UNIT/ML ~~LOC~~ SOPN: 18.0000 [IU] | PEN_INJECTOR | Freq: Every day | SUBCUTANEOUS | 3 refills | Status: DC

## 2021-01-22 MED ORDER — MELOXICAM 15 MG PO TABS: ORAL_TABLET | ORAL | 3 refills | Status: DC

## 2021-01-22 NOTE — Patient Instructions (Signed)
Take pantoprazole for now.  If you have more hiccups, then start baclofen.  Update me as needed.  Take care.  Glad to see you. Plan on recheck A1c at a visit in about 1-2 months.

## 2021-01-22 NOTE — Progress Notes (Signed)
This visit occurred during the SARS-CoV-2 public health emergency.  Safety protocols were in place, including screening questions prior to the visit, additional usage of staff PPE, and extensive cleaning of exam room while observing appropriate contact time as indicated for disinfecting solutions.  Hiccups.  Going on for about 1 week.  Tried taking sugar, breathing in a bag, drinking more water, taking meloxicam, warm shower.  meloxicam seemed to help.  No sx today fortunately. He had a cough trigger with eating that can lead to sneezing/hiccup.  Routine cautions discussed with patient.  Taking metformin occ.  Insulin max dose 26 units per day.    Meds, vitals, and allergies reviewed.   ROS: Per HPI unless specifically indicated in ROS section   GEN: nad, alert and oriented HEENT: ncat NECK: supple w/o LA, no stridor. CV: rrr.  PULM: ctab, no inc wob ABD: soft, +bs

## 2021-01-23 DIAGNOSIS — R066 Hiccough: Secondary | ICD-10-CM | POA: Insufficient documentation

## 2021-01-23 NOTE — Assessment & Plan Note (Signed)
Discussed options. Take pantoprazole for now.  If more hiccups, then start baclofen.  Update me as needed.  Rationale and anatomy discussed with patient.  He agrees.

## 2021-01-23 NOTE — Assessment & Plan Note (Addendum)
No change in meds for now. Plan on recheck A1c at a visit in about 1-2 months.

## 2021-02-05 ENCOUNTER — Other Ambulatory Visit: Payer: Self-pay | Admitting: Adult Health

## 2021-02-07 ENCOUNTER — Encounter: Payer: Self-pay | Admitting: Adult Health

## 2021-02-08 ENCOUNTER — Other Ambulatory Visit: Payer: Self-pay | Admitting: *Deleted

## 2021-02-08 MED ORDER — LEVETIRACETAM 1000 MG PO TABS: 1000.0000 mg | ORAL_TABLET | Freq: Two times a day (BID) | ORAL | 0 refills | Status: DC

## 2021-02-08 NOTE — Progress Notes (Signed)
Received my chart stating patient will run out of med today, needs temp Rx to [facility], 30 day supply sent in as requested.

## 2021-02-18 ENCOUNTER — Other Ambulatory Visit: Payer: Self-pay | Admitting: Adult Health

## 2021-02-22 ENCOUNTER — Other Ambulatory Visit: Payer: Self-pay | Admitting: Family Medicine

## 2021-02-22 ENCOUNTER — Other Ambulatory Visit: Payer: Self-pay

## 2021-02-22 NOTE — Patient Outreach (Signed)
[facility]) Care Management  [place]  02/22/2021   Bradley Davila Feb 10, 1961 [id number]  Subjective: Telephone call to patient for disease management follow up. Patient reports he is doing ok except for his sugars are still in the 200 range.  However, last A1c was 7.3. Discussed diabetes management.  He verbalized understanding.    Objective:   Encounter Medications:  Outpatient Encounter Medications as of 02/22/2021  Medication Sig   acetaminophen (TYLENOL) 325 MG tablet You can take 2 tablets every 6 hours as needed for pain. Use this first then the prescribed pain medication.   DO NOT TAKE MORE THAN 4000 MG OF TYLENOL PER DAY.  IT CAN HARM YOUR LIVER.   atorvastatin (LIPITOR) 20 MG tablet TAKE 1 TABLET EVERY DAY   baclofen (LIORESAL) 10 MG tablet Take 0.5-1 tablets (5-10 mg total) by mouth 3 (three) times daily as needed (for hiccups).   EPINEPHrine 0.3 mg/0.3 mL IJ SOAJ injection Inject 0.3 mg into the muscle as needed.   insulin glargine (LANTUS SOLOSTAR) 100 UNIT/ML Solostar Pen Inject 18-30 Units into the skin daily.   latanoprost (XALATAN) 0.005 % ophthalmic solution    levETIRAcetam (KEPPRA) 1000 MG tablet Take 1 tablet (1,000 mg total) by mouth 2 (two) times daily.   meloxicam (MOBIC) 15 MG tablet TAKE 1 TABLET EVERY DAY WITH FOOD AS NEEDED FOR PAIN   metFORMIN (GLUCOPHAGE-XR) 500 MG 24 hr tablet Take 500 mg by mouth daily with breakfast. If sugar is elevated.   oxcarbazepine (TRILEPTAL) 600 MG tablet TAKE 1 TABLET TWICE DAILY   pantoprazole (PROTONIX) 20 MG tablet Take 1 tablet (20 mg total) by mouth daily.   ramipril (ALTACE) 1.25 MG capsule Take 1 capsule (1.25 mg total) by mouth daily.   TRUE METRIX BLOOD GLUCOSE TEST test strip USE AS DIRECTED  TO TEST BLOOD SUGAR ONE TIME DAILY  OR AS NEEDED   TRUEPLUS LANCETS 33G MISC Use as instructed to test blood sugar once daily or as needed.  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   No  facility-administered encounter medications on file as of 02/22/2021.    Functional Status:  In your present state of health, do you have any difficulty performing the following activities: 05/28/2020  Hearing? 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 N  Dressing or bathing? N  Doing errands, shopping? Y  Comment wife assists  Conservation officer, nature and eating ? N  Using the Toilet? N  In the past six months, have you accidently leaked urine? N  Do you have problems with loss of bowel control? N  Managing your Medications? Y  Comment wife handles  Managing your Finances? Y  Comment wife handles  Some recent data might be hidden    Fall/Depression Screening: Fall Risk  11/23/2020 10/15/2020 05/28/2020  Falls in the past year? 0 1 0  Number falls in past yr: - 0 -  Injury with Fall? - 1 -  Risk for fall due to : - History of fall(s) -  Follow up - Falls evaluation completed -   PHQ 2/9 Scores 11/23/2020 05/28/2020 02/27/2020 11/09/2019 07/01/2019 06/10/2019 10/26/2018  PHQ - 2 Score 0 0 1 2 1 1 3   PHQ- 9 Score - - - 10 - - 8    Assessment:   Care Plan Care Plan : Diabetes Type 2 (  Adult)  Updates made by Jon Billings, RN since 02/22/2021 12:00 AM     Problem: Disease Progression (Diabetes, Type 2)      Long-Range Goal: Disease Progression Prevented or Minimized as evidenced by A1c less than 8.   Start Date: 08/24/2020  Expected End Date: 09/14/2021  This Visit's Progress: On track  Recent Progress: On track  Priority: High  Note:   Evidence-based guidance:  Encourage lifestyle changes, such as increased intake of plant-based foods, stress reduction, consistent physical activity, and smoking cessation to prevent  long-term complications and chronic diseases.  Individualize activity and exercise recommendations while considering  potential limitations, such  as neuropathy, retinopathy, or the ability to prevent  hyperglycemia or hypoglycemia. Notes:     Task: Monitor and Manage Follow-up for Comorbidities   Due Date: 09/14/2021  Priority: Routine  Responsible User: Jon Billings, RN  Note:   Care Management Activities:    - activity based on tolerance and functional limitations encouraged - healthy lifestyle promoted - response to pharmacologic therapy monitored    Notes:       Goals Addressed             This Visit's Progress    COMPLETED: [facility]-Make and Keep All Appointments   On track    Timeframe:  Long-Range Goal Priority:  High Start Date:   08/24/20                          Expected End Date: 05/15/21           Follow up 03/14/21   - arrange a ride through an agency 1 week before appointment - ask family or friend for a ride    Why is this important?   Part of staying healthy is seeing the doctor for follow-up care.  If you forget your appointments, there are some things you can do to stay on track.    Notes: Patient sees physician a scheduled.        [facility]-Monitor and Manage My Blood Sugar-Diabetes Type 2   On track    Timeframe:  Long-Range Goal Priority:  High Start Date:    12/10//21                         Expected End Date: 09/14/21  Follow up date: 06/14/21               - check blood sugar at prescribed times - take the blood sugar log to all doctor visits    Why is this important?   Checking your blood sugar at home helps to keep it from getting very high or very low.  Writing the results in a diary or log helps the doctor know how to care for you.  Your blood sugar log should have the time, date and the results.  Also, write down the amount of insulin or other medicine that you take.  Other information, like what you ate, exercise done and how you were feeling, will also be helpful.     Notes: Patient reports blood sugars higher right now with today's reading 212.  Discussed importance of diet.  Follow up with physician. 02/22/21 Patient reports blood sugars in the 200's.  Last A1c was 7.3 Reviewed importance of limiting carbs such as rice, potatoes, breads, and pastas. Also discussed limiting sweets and sugary drinks.  Discussed importance of portion control.  Also discussed importance of annual  exams such as mammogram and eye exam.           Plan:  Follow-up: Patient agrees to Care Plan and Follow-up. Follow-up in 3 month(s)

## 2021-02-26 ENCOUNTER — Encounter: Payer: Self-pay | Admitting: Family Medicine

## 2021-02-26 ENCOUNTER — Ambulatory Visit (INDEPENDENT_AMBULATORY_CARE_PROVIDER_SITE_OTHER): Payer: Medicare HMO | Admitting: Family Medicine

## 2021-02-26 ENCOUNTER — Other Ambulatory Visit: Payer: Self-pay

## 2021-02-26 VITALS — BP 120/82 | HR 87 | Temp 98.3°F | Ht 66.0 in | Wt 220.0 lb

## 2021-02-26 DIAGNOSIS — E1165 Type 2 diabetes mellitus with hyperglycemia: Secondary | ICD-10-CM | POA: Diagnosis not present

## 2021-02-26 DIAGNOSIS — IMO0002 Reserved for concepts with insufficient information to code with codable children: Secondary | ICD-10-CM

## 2021-02-26 DIAGNOSIS — Z659 Problem related to unspecified psychosocial circumstances: Secondary | ICD-10-CM

## 2021-02-26 DIAGNOSIS — E118 Type 2 diabetes mellitus with unspecified complications: Secondary | ICD-10-CM | POA: Diagnosis not present

## 2021-02-26 DIAGNOSIS — R21 Rash and other nonspecific skin eruption: Secondary | ICD-10-CM | POA: Diagnosis not present

## 2021-02-26 LAB — POCT GLYCOSYLATED HEMOGLOBIN (HGB A1C): Hemoglobin A1C: 10.1 % — AB (ref 4.0–5.6)

## 2021-02-26 MED ORDER — CLOTRIMAZOLE-BETAMETHASONE 1-0.05 % EX CREA: 1.0000 "application " | TOPICAL_CREAM | Freq: Two times a day (BID) | CUTANEOUS | 0 refills | Status: DC | PRN

## 2021-02-26 NOTE — Patient Instructions (Addendum)
Plan on recheck in about 3 months.  A1c at the visit.  Add 1 unit of insulin a day in the meantime.  Goal sugar about 120-150.   Let me check on trulicity in the meantime.   Cut back on soda.   Use lotrisone in the meantime and see if that helps with itching on your backside.   Take care.  Glad to see you.

## 2021-02-26 NOTE — Progress Notes (Signed)
This visit occurred during the SARS-CoV-2 public health emergency.  Safety protocols were in place, including screening questions prior to the visit, additional usage of staff PPE, and extensive cleaning of exam room while observing appropriate contact time as indicated for disinfecting solutions.  Diabetes:  Using medications without difficulties: taking 26 units insulin, see below re: metformin.   Hypoglycemic episodes: no Hyperglycemic episodes: up to ~260 Feet problems: some discomfort likely from neuropathy with sensation of needles, see exam.   Blood Sugars averaging: see above.   eye exam within last year: due, d/w pt.   A1c up to 10.1, d/w pt at [place].  GI improved on lower dose of metformin.  He can tolerate 1 tab a day.   D/w pt about cutting back on soda.   D/w pt about death of brother, grief.  Wants to try counseling.  No SI/HI.  Referral placed.  R hand rash, dry skin in patches on the palm, noted in the last 2 weeks.  Has used OTC lotion.    Itching with possible hemorrhoids noted.  Failed OTC meds.  See exam.  Meds, vitals, and allergies reviewed.  ROS: Per HPI unless specifically indicated in ROS section   GEN: nad, alert and oriented, speech at baseline.  HEENT: ncat NECK: supple w/o LA CV: rrr. PULM: ctab, no inc wob ABD: soft, +bs EXT: no edema SKIN: no acute rash but irritation in the gluteal crease.  Old residual changes from prior pilonidal cyst (distant past per patient report) but no abscess or acute infection.   Diabetic foot exam: Normal inspection No skin breakdown No calluses  Normal DP pulses Normal sensation to light touch and monofilament Nails normal  33 minutes were devoted to patient care in this encounter (this includes time spent reviewing the patient's file/history, interviewing and examining the patient, counseling/reviewing plan with patient).

## 2021-02-27 DIAGNOSIS — Z659 Problem related to unspecified psychosocial circumstances: Secondary | ICD-10-CM | POA: Insufficient documentation

## 2021-02-27 NOTE — Assessment & Plan Note (Signed)
No hemorrhoids seen.  He can use Lotrisone cream topically and update me if that is not helping.  No sign of recurrent pilonidal cyst.

## 2021-02-27 NOTE — Assessment & Plan Note (Signed)
No suicidal homicidal intent.  Okay for outpatient follow-up.  Refer for counseling.

## 2021-02-27 NOTE — Assessment & Plan Note (Signed)
A1c up to 10.1, d/w pt at [place].  GI improved on lower dose of metformin.  He can tolerate 1 tab a day.   D/w pt about cutting back on soda.  Plan on recheck in about 3 months.  A1c at the visit.  Add 1 unit of insulin a day in the meantime.  Goal sugar about 120-150.   I will check on trulicity options with pharmacy staff here in the meantime.

## 2021-03-02 ENCOUNTER — Other Ambulatory Visit: Payer: Self-pay | Admitting: Adult Health

## 2021-03-05 ENCOUNTER — Telehealth: Payer: Self-pay

## 2021-03-05 NOTE — Telephone Encounter (Signed)
-----   Message from [place], [facility] sent at 03/04/2021  4:21 PM EDT ----- Regarding: Insulin PAP Patient is not yet enrolled in CCM. Ria Comment can you call him about insulin PAP?  Tatj[place], can we schedule him for CCM if he is eligible? Below is the PCP referral message.   Provider Comments 02/27/2021 11:21 PM Tonia Ghent, MD Provider Comments - Note   Please talk to me about helping this patient get set up with Trulicity.  He also needs help paying for his insulin in the meantime.  I would appreciate any and all help.       Debbora Dus, PharmD Clinical Pharmacist [facility] [phone]

## 2021-03-05 NOTE — Progress Notes (Signed)
    Chronic Care Management Pharmacy Assistant   Name: Bradley Davila  MRN: [id number] DOB: [birth year]  Reason for Encounter: Patient Assistance Applications (Trulicity and Lantus Solostar)   Medications: Outpatient Encounter Medications as of 03/05/2021  Medication Sig   acetaminophen (TYLENOL) 325 MG tablet You can take 2 tablets every 6 hours as needed for pain. Use this first then the prescribed pain medication.   DO NOT TAKE MORE THAN 4000 MG OF TYLENOL PER DAY.  IT CAN HARM YOUR LIVER.   atorvastatin (LIPITOR) 20 MG tablet TAKE 1 TABLET EVERY DAY   baclofen (LIORESAL) 10 MG tablet Take 0.5-1 tablets (5-10 mg total) by mouth 3 (three) times daily as needed (for hiccups).   clotrimazole-betamethasone (LOTRISONE) cream Apply 1 application topically 2 (two) times daily as needed.   EPINEPHrine 0.3 mg/0.3 mL IJ SOAJ injection Inject 0.3 mg into the muscle as needed.   insulin glargine (LANTUS SOLOSTAR) 100 UNIT/ML Solostar Pen Inject 18-30 Units into the skin daily.   latanoprost (XALATAN) 0.005 % ophthalmic solution    levETIRAcetam (KEPPRA) 1000 MG tablet Take 1 tablet (1,000 mg total) by mouth 2 (two) times daily.   meloxicam (MOBIC) 15 MG tablet TAKE 1 TABLET EVERY DAY WITH FOOD AS NEEDED FOR PAIN   metFORMIN (GLUCOPHAGE-XR) 500 MG 24 hr tablet Take 500 mg by mouth daily with breakfast. If sugar is elevated.   oxcarbazepine (TRILEPTAL) 600 MG tablet TAKE 1 TABLET TWICE DAILY   pantoprazole (PROTONIX) 20 MG tablet Take 1 tablet (20 mg total) by mouth daily.   ramipril (ALTACE) 1.25 MG capsule TAKE 1 CAPSULE BY MOUTH DAILY   TRUE METRIX BLOOD GLUCOSE TEST test strip USE AS DIRECTED  TO TEST BLOOD SUGAR ONE TIME DAILY  OR AS NEEDED   TRUEPLUS LANCETS 33G MISC Use as instructed to test blood sugar once daily or as needed.  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   No facility-administered encounter medications on file as of 03/05/2021.   Patient assistance applications for Trulicity  and Lantus Solostar have been completed on behalf of the patient. Patient's wife requested applications be mailed to them. Patient aware he will need to provide requested income verification to be sent the manufacturer. Applications sent to Dr. Dam[facility] clinic to mail out to patient.   Follow-Up:  Patient Assistance Coordination and Pharmacist Review  Debbora Dus, CPP notified  Margaretmary Dys, [place] Pharmacy Assistant [phone]

## 2021-03-15 ENCOUNTER — Telehealth: Payer: Self-pay | Admitting: Family Medicine

## 2021-03-15 NOTE — Progress Notes (Signed)
  Chronic Care Management   Outreach Note  03/15/2021 Name: Bradley Davila MRN: [id number] DOB: Apr 01, 1961  Referred by: Tonia Ghent, MD Reason for referral : No chief complaint on file.   A second unsuccessful telephone outreach was attempted today. The patient was referred to pharmacist for assistance with care management and care coordination.  Follow Up Plan:   Tatjana Dellinger Upstream Scheduler

## 2021-03-18 ENCOUNTER — Other Ambulatory Visit: Payer: Self-pay | Admitting: Adult Health

## 2021-03-22 ENCOUNTER — Telehealth: Payer: Self-pay | Admitting: Family Medicine

## 2021-03-22 NOTE — Chronic Care Management (AMB) (Signed)
  Chronic Care Management   Note  03/22/2021 Name: Bradley Davila MRN: [id number] DOB: October 23, 1960  Bradley Davila is a 60 y.o. year old male who is a primary care patient of Tonia Ghent, MD. I reached out to [name] by phone today in response to a referral sent by [name] PCP, Tonia Ghent, MD.   [name] was given information about Chronic Care Management services today including:  CCM service includes personalized support from designated clinical staff supervised by his physician, including individualized plan of care and coordination with other care providers 24/7 contact phone numbers for assistance for urgent and routine care needs. Service will only be billed when office clinical staff spend 20 minutes or more in a month to coordinate care. Only one practitioner may furnish and bill the service in a calendar month. The patient may stop CCM services at any time (effective at the end of the month) by phone call to the office staff.   [name]  verbally agreed to assistance and services provided by embedded care coordination/care management team today.  Follow up plan:   Tatjana Secretary/administrator

## 2021-03-25 ENCOUNTER — Ambulatory Visit (INDEPENDENT_AMBULATORY_CARE_PROVIDER_SITE_OTHER): Payer: Medicare HMO | Admitting: Psychologist

## 2021-03-25 DIAGNOSIS — F321 Major depressive disorder, single episode, moderate: Secondary | ICD-10-CM

## 2021-03-28 ENCOUNTER — Telehealth: Payer: Self-pay

## 2021-03-28 NOTE — Chronic Care Management (AMB) (Addendum)
Chronic Care Management Pharmacy Assistant   Name: Bradley Davila  MRN: [id number] DOB: [birth year]  Bradley Davila is an 60 y.o. year old male who presents for his initial CCM visit with the clinical pharmacist.  Reason for Encounter: Initial Questions   Conditions to be addressed/monitored: HLD and DMII   Recent office visits:  02/26/21 - Dr.Duncan PCP - use lotrisone cream topically discuss trulicity options - Add 1 unit of insulin a day in the meantime.  Goal sugar about 120-150. follow up 3 months  01/22/21 - PCP- hiccups-added baclofen 5-10mg  take 1 tablet 3 times daily as needed,start pantoprazole 20mg  take 1 tablet daily  10/15/20 - PCP -Stop metformin and gradually add 1 unit of insulin a day if needed.  follow up 3-4 months  Recent consult visits:  01/02/21 - Neurology -labs ordered  no medication changes  Hospital visits:  None in previous 6 months  Medications: Outpatient Encounter Medications as of 03/28/2021  Medication Sig   acetaminophen (TYLENOL) 325 MG tablet You can take 2 tablets every 6 hours as needed for pain. Use this first then the prescribed pain medication.   DO NOT TAKE MORE THAN 4000 MG OF TYLENOL PER DAY.  IT CAN HARM YOUR LIVER.   atorvastatin (LIPITOR) 20 MG tablet TAKE 1 TABLET EVERY DAY   baclofen (LIORESAL) 10 MG tablet Take 0.5-1 tablets (5-10 mg total) by mouth 3 (three) times daily as needed (for hiccups).   clotrimazole-betamethasone (LOTRISONE) cream Apply 1 application topically 2 (two) times daily as needed.   EPINEPHrine 0.3 mg/0.3 mL IJ SOAJ injection Inject 0.3 mg into the muscle as needed.   insulin glargine (LANTUS SOLOSTAR) 100 UNIT/ML Solostar Pen Inject 18-30 Units into the skin daily.   latanoprost (XALATAN) 0.005 % ophthalmic solution    levETIRAcetam (KEPPRA) 1000 MG tablet TAKE 1 TABLET BY MOUTH TWICE A DAY   meloxicam (MOBIC) 15 MG tablet TAKE 1 TABLET EVERY DAY WITH FOOD AS NEEDED FOR PAIN   metFORMIN (GLUCOPHAGE-XR) 500 MG 24  hr tablet Take 500 mg by mouth daily with breakfast. If sugar is elevated.   oxcarbazepine (TRILEPTAL) 600 MG tablet TAKE 1 TABLET TWICE DAILY   pantoprazole (PROTONIX) 20 MG tablet Take 1 tablet (20 mg total) by mouth daily.   ramipril (ALTACE) 1.25 MG capsule TAKE 1 CAPSULE BY MOUTH DAILY   TRUE METRIX BLOOD GLUCOSE TEST test strip USE AS DIRECTED  TO TEST BLOOD SUGAR ONE TIME DAILY  OR AS NEEDED   TRUEPLUS LANCETS 33G MISC Use as instructed to test blood sugar once daily or as needed.  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   No facility-administered encounter medications on file as of 03/28/2021.     Lab Results  Component Value Date/Time   HGBA1C 10.1 (A) 02/26/2021 11:30 AM   HGBA1C 7.3 (A) 10/15/2020 03:41 PM   HGBA1C 7.9 (H) 10/31/2019 10:51 AM   HGBA1C 8.5 (H) 10/26/2018 08:38 AM   MICROALBUR 24.2 (H) 10/31/2019 10:51 AM   MICROALBUR 57.8 (H) 10/26/2018 08:38 AM     BP Readings from Last 3 Encounters:  02/26/21 120/82  01/22/21 106/70  01/02/21 124/81  No answer 7/14  Patient contacted to review initial questions prior to visit with Debbora Dus.  Have you seen any other providers since your last visit with PCP? No  Any changes in your medications or health? No  Any side effects from any medications? No  Do you have an symptoms or problems not  managed by your medications? No   Any concerns about your health right now? Yes The patient reports he continues to have  Eczema on hands and cannot get this under control  Has your provider asked that you check blood pressure, blood sugar, or follow special diet at home? The patient does check his blood sugar every morning and will have readings available, he does not have a home BP cuff  and the patient is trying to cut back on sodas in his diet  Do you get any type of exercise on a regular basis?  No exercise on a regular basis but stays active walks around the yard and the wife will take the patient to parks and just  [place]ed [place] museum  Can you think of a goal you would like to reach for your health? No  Do you have any problems getting your medications? No  Is there anything that you would like to discuss during the appointment? No   Bradley Davila was reminded to have all medications, supplements and any blood glucose and blood pressure readings available for review with Debbora Dus, Pharm. D, at his telephone visit on 04/03/2021 at 3:30pm   Star Rating Drugs:  Medication:  Last Fill: Day Supply Ramipril 1.25mg  12/27/20 90 Lantus   01/23/21 90 Metformin XR 500mg  09/01/20 90 Atorvastatin 20mg  12/24/20 90   Dr.Duncan had the patient off metformin for over a month to calm down the GI system and now the patient is only taking 1 tablet 2 times daily am,pm, and still has a full bottle from 08/2020  Care Gaps: Last annual wellness visit: 11/06/2019 If Diabetic: Last eye exam / retinopathy screening: due  Last diabetic foot exam: 02/26/21  Debbora Dus, CPP notified  [name], [place] Assistant [phone]  I have reviewed the care management and care coordination activities outlined in this encounter and I am certifying that I agree with the content of this note. No further action required.  Debbora Dus, PharmD Clinical Pharmacist [facility] at [facility] [phone]

## 2021-04-03 ENCOUNTER — Ambulatory Visit (INDEPENDENT_AMBULATORY_CARE_PROVIDER_SITE_OTHER): Payer: Medicare HMO

## 2021-04-03 ENCOUNTER — Other Ambulatory Visit: Payer: Self-pay

## 2021-04-03 DIAGNOSIS — E785 Hyperlipidemia, unspecified: Secondary | ICD-10-CM

## 2021-04-03 DIAGNOSIS — E1165 Type 2 diabetes mellitus with hyperglycemia: Secondary | ICD-10-CM | POA: Diagnosis not present

## 2021-04-03 DIAGNOSIS — IMO0002 Reserved for concepts with insufficient information to code with codable children: Secondary | ICD-10-CM

## 2021-04-03 DIAGNOSIS — E118 Type 2 diabetes mellitus with unspecified complications: Secondary | ICD-10-CM

## 2021-04-03 NOTE — Patient Instructions (Signed)
April 03, 2021  Dear Bradley Davila,  It was a pleasure meeting you during our initial appointment on April 03, 2021. Below is a summary of the goals we discussed and components of chronic care management. Please contact me anytime with questions or concerns.   Visit Information  Patient Care Plan: CCM Pharmacy Care Plan     Problem Identified: CHL AMB "PATIENT-SPECIFIC PROBLEM"      Long-Range Goal: Disease Management   Start Date: 04/03/2021  Priority: High  Note:   Current Barriers:  Unable to achieve control of diabetes   Pharmacist Clinical Goal(s):  Patient will contact provider office for questions/concerns as evidenced notation of same in electronic health record through collaboration with PharmD and provider.   Interventions: 1:1 collaboration with Tonia Ghent, MD regarding development and update of comprehensive plan of care as evidenced by provider attestation and co-signature Inter-disciplinary care team collaboration (see longitudinal plan of care) Comprehensive medication review performed; medication list updated in electronic medical record  Hyperlipidemia: (LDL goal < 70) -Controlled - LDL 69 -Current treatment: Atorvastatin 20 mg - 1 tablet daily  -Medications previously tried: none reported  -Reviewed adherence, denies missed doses. Refills timely.  -Educated on Cholesterol goals;  -Recommended to continue current medication  Diabetes (A1c goal <7%) -Not ideally controlled - A1c 10.1% -Current medications: Lantus - Inject 33 units daily (dose confirmed by patient) If BG > 140, adds 1 units each night per patient report Metformin 500 mg ER - 1 tablet twice daily -Medications previously tried: GI upset with more than 1000 mg metformin/day -Current home glucose readings - checking once every morning and occasionally at bedtime fasting glucose: 197 (this morning), 185, 178, 173,  194 post prandial glucose: checks at bedtime, does not have any readings to  report Denies any hypoglycemia  -Denies hypoglycemic/hyperglycemic symptoms - concerned his rash is related to high BG -Affordability-  Patient completed both Trulicity and Lantus PAP applications yesterday, dropped off at office and enclosed the proof of income -Current meal patterns: eats three meals a day breakfast: spaghetti and meatballs/leftovers for breakfast  dinner: a lot of frozen meals from [employer] - lasagne, meatloaf, macaroni and cheese (whole wheat pasta); does not like any vegetables, prefers steak/beef, does not like chicken/pork/fish  snacks: limits sweets, eats fruit as snack - apple, grape, jello (not sugar free) drinks: trying to switch all sodas to flavored water or diet soda, still has regular soda on occasion -Current exercise: minimal -ACE-I: Yes- ramipril -Educated on A1c and blood sugar goals; -Counseled to check feet daily and get yearly eye exams - denies any foot concerns today -Discussed watching portions since his meals are very carbohydrate heavy. Continue to limit sugary drinks/soda. -Recommended to continue current medication; Increase Lantus to 36 units this evening. Will fax PAPs to manufacturer this week.  Patient Goals/Self-Care Activities Patient will:  - take medications as prescribed check glucose daily, document, and provide at future appointments engage in dietary modifications by avoiding soda and limiting portion sizes  Follow Up Plan: Telephone follow up appointment with care management team member scheduled for:  1 week for BG log     Mr. Dorko was given information about Chronic Care Management services today including:  CCM service includes personalized support from designated clinical staff supervised by his physician, including individualized plan of care and coordination with other care providers 24/7 contact phone numbers for assistance for urgent and routine care needs. Standard insurance, coinsurance, copays and deductibles apply  for chronic  care management only during months in which we provide at least 20 minutes of these services. Most insurances cover these services at 100%, however patients may be responsible for any copay, coinsurance and/or deductible if applicable. This service may help you avoid the need for more expensive face-to-face services. Only one practitioner may furnish and bill the service in a calendar month. The patient may stop CCM services at any time (effective at the end of the month) by phone call to the office staff.  Patient agreed to services and verbal consent obtained.   Patient verbalizes understanding of instructions provided today and agrees to view in [place].   Debbora Dus, PharmD Clinical Pharmacist [facility] at [facility] [phone]

## 2021-04-03 NOTE — Progress Notes (Signed)
Chronic Care Management Pharmacy Note  04/03/2021 Name:  Bradley Davila MRN:  [id number] DOB:  1961-01-23  Summary: Diabetes discussed. A1c 10.1%. Pt reports fasting BG 170-190 this week. Taking 33 units Lantus and metformin 500 mg BID. Reviewed diet. He is doing better limiting sodas. He  is looking forward to starting Trulicity and brought in applications for Trulicity and Lantus PAP yesterday. These will be processed this week.  Recommendations/Changes made from today's visit: Increase Lantus from 33 to 36 units at bedtime, continue to monitor BG daily.  Plan: CMA call in 7 days for BG log Will start Trulicity pending PAP approval   Subjective: Bradley Davila is an 60 y.o. year old male who is a primary patient of Damita Dunnings, Elveria Rising, MD.  The CCM team was consulted for assistance with disease management and care coordination needs.    Engaged with patient by telephone for initial visit in response to provider referral for pharmacy case management and/or care coordination services.   Consent to Services:  The patient was given the following information about Chronic Care Management services today, agreed to services, and gave verbal consent: 1. CCM service includes personalized support from designated clinical staff supervised by the primary care provider, including individualized plan of care and coordination with other care providers 2. 24/7 contact phone numbers for assistance for urgent and routine care needs. 3. Service will only be billed when office clinical staff spend 20 minutes or more in a month to coordinate care. 4. Only one practitioner may furnish and bill the service in a calendar month. 5.The patient may stop CCM services at any time (effective at the end of the month) by phone call to the office staff. 6. The patient will be responsible for cost sharing (co-pay) of up to 20% of the service fee (after annual deductible is met). Patient agreed to services and consent  obtained.  Patient Care Team: Tonia Ghent, MD as PCP - General (Family Medicine) Jon Billings, RN as [place] Management Debbora Dus, [facility] as Pharmacist (Pharmacist)  Recent office visits:  02/26/21 - Dr.Duncan PCP - Referral to grief counseling placed. Rash, Apply lotrisone cream topically. A1c 10.1% tolerates 1 metformin/day. Discuss trulicity cost options. Add 1 unit of insulin a day in the meantime. Cut back on soda. Goal sugar about 120-150. Follow up 3 months. 01/22/21 - PCP- Hiccups, Start pantoprazole 20mg  take 1 tablet daily, if not resolved, start baclofen 5-10 mg - take 1 tablet 3 times daily as needed.  10/15/20 - PCP - Stop metformin and gradually add 1 unit of insulin a day if needed.  Follow up 3-4 months   Recent consult visits:  01/02/21 - Neurology - Follow up for Seizures. Continue Keppra 1000 mg BID. Continue Trileptal 600 mg BID. Discussed increasing his Keppra dose however the patient deferred.   Hospital visits:  None in previous 6 months   Objective:  Lab Results  Component Value Date   CREATININE 1.13 01/02/2021   BUN 12 01/02/2021   GFR 78.03 10/31/2019   GFRNONAA 87 01/03/2020   GFRAA 100 01/03/2020   NA 139 01/02/2021   K 4.7 01/02/2021   CALCIUM 9.8 01/02/2021   CO2 24 01/02/2021   GLUCOSE 136 (H) 01/02/2021    Lab Results  Component Value Date/Time   HGBA1C 10.1 (A) 02/26/2021 11:30 AM   HGBA1C 7.3 (A) 10/15/2020 03:41 PM   HGBA1C 7.9 (H) 10/31/2019 10:51 AM   HGBA1C 8.5 (H) 10/26/2018 08:38  AM   GFR 78.03 10/31/2019 10:51 AM   GFR 79.23 10/26/2018 08:38 AM   MICROALBUR 24.2 (H) 10/31/2019 10:51 AM   MICROALBUR 57.8 (H) 10/26/2018 08:38 AM    Last diabetic Eye exam:  Lab Results  Component Value Date/Time   HMDIABEYEEXA No Retinopathy 11/17/2019 12:00 AM    Last diabetic Foot exam: 02/26/2021   Lab Results  Component Value Date   CHOL 143 10/31/2019   HDL 43.30 10/31/2019   LDLCALC 69 10/31/2019    LDLDIRECT 171.0 10/26/2018   TRIG 151.0 (H) 10/31/2019   CHOLHDL 3 10/31/2019    Hepatic Function Latest Ref Rng & Units 01/02/2021 01/03/2020 10/31/2019  Total Protein 6.0 - 8.5 g/dL 7.7 7.2 7.3  Albumin 3.8 - 4.8 g/dL 4.4 4.5 4.4  AST 0 - 40 IU/L $Remov'19 18 17  'qphwJm$ ALT 0 - 44 IU/L $Remov'24 23 25  'xMYoSZ$ Alk Phosphatase 44 - 121 IU/L 101 89 84  Total Bilirubin 0.0 - 1.2 mg/dL 0.3 <0.2 0.4  Bilirubin, Direct 0.0 - 0.3 mg/dL - - -    Lab Results  Component Value Date/Time   TSH 1.188 10/21/2006 09:14 PM    CBC Latest Ref Rng & Units 01/02/2021 01/03/2020 08/03/2017  WBC 3.4 - 10.8 x10E3/uL 9.1 9.9 10.6(H)  Hemoglobin 13.0 - 17.7 g/dL 15.2 15.0 15.3  Hematocrit 37.5 - 51.0 % 44.8 42.6 44.8  Platelets 150 - 450 x10E3/uL 346 373 334    No results found for: VD25OH  Clinical ASCVD: Yes  The ASCVD Risk score Mikey Bussing DC Jr., et al., 2013) failed to calculate for the following reasons:   The patient has a prior MI or stroke diagnosis    Depression screen [facility] 2/9 11/23/2020 05/28/2020 02/27/2020  Decreased Interest 0 0 0  Down, Depressed, Hopeless 0 0 1  PHQ - 2 Score 0 0 1  Altered sleeping - - -  Tired, decreased energy - - -  Change in appetite - - -  Feeling bad or failure about yourself  - - -  Trouble concentrating - - -  Moving slowly or fidgety/restless - - -  Suicidal thoughts - - -  PHQ-9 Score - - -  Difficult doing work/chores - - -    Social History   Tobacco Use  Smoking Status Some Days  Smokeless Tobacco Never   BP Readings from Last 3 Encounters:  02/26/21 120/82  01/22/21 106/70  01/02/21 124/81   Pulse Readings from Last 3 Encounters:  02/26/21 87  01/22/21 71  01/02/21 73   Wt Readings from Last 3 Encounters:  02/26/21 220 lb (99.8 kg)  01/22/21 226 lb (102.5 kg)  01/02/21 221 lb (100.2 kg)   BMI Readings from Last 3 Encounters:  02/26/21 35.51 kg/m  01/22/21 36.48 kg/m  01/02/21 35.67 kg/m    Assessment/Interventions: Review of patient past medical history,  allergies, medications, health status, including review of consultants reports, laboratory and other test data, was performed as part of comprehensive evaluation and provision of chronic care management services.   SDOH:  (Social Determinants of Health) assessments and interventions performed: Yes SDOH Interventions    Flowsheet Row Most Recent Value  SDOH Interventions   Financial Strain Interventions Other (Comment)  [Cost assistance program for medications]      SDOH Screenings   Alcohol Screen: Not on file  Depression (PHQ2-9): Low Risk    PHQ-2 Score: 0  Financial Resource Strain: High Risk   Difficulty of Paying Living Expenses: Hard  Food Insecurity: No  Food Insecurity   Worried About Charity fundraiser in the Last Year: Never true   Ran Out of Food in the Last Year: Never true  Housing: Low Risk    Last Housing Risk Score: 0  Physical Activity: Not on file  Social Connections: Not on file  Stress: Not on file  Tobacco Use: High Risk   Smoking Tobacco Use: Some Days   Smokeless Tobacco Use: Never  Transportation Needs: No Transportation Needs   Lack of Transportation (Medical): No   Lack of Transportation (Non-Medical): No    CCM Care Plan  Allergies  Allergen Reactions   Benadryl [Diphenhydramine Hcl]     Caused seizures.    Yellow Jacket Venom [Honey Bee Venom]     Facial swelling after sting   Carbamazepine     Inc in seizures   Lamotrigine     Inc in seizures   Metformin And Related     Diarrhea with higher doses   Phenobarbital     rash    Medications Reviewed Today     Reviewed by Debbora Dus, [facility] (Pharmacist) on 04/03/21 at [place] List Status: <None>   Medication Order Taking? Sig Documenting Provider Last Dose Status Informant  acetaminophen (TYLENOL) 325 MG tablet [id number] Yes You can take 2 tablets every 6 hours as needed for pain. Use this first then the prescribed pain medication.   DO NOT TAKE MORE THAN 4000 MG OF TYLENOL PER DAY.   IT CAN HARM YOUR LIVER. Earnstine Regal, PA-C Taking Active   atorvastatin (LIPITOR) 20 MG tablet [id number] Yes TAKE 1 TABLET EVERY DAY Tonia Ghent, MD Taking Active   EPINEPHrine 0.3 mg/0.3 mL IJ SOAJ injection [id number] Yes Inject 0.3 mg into the muscle as needed. Owens Loffler, MD Taking Active Self           Med Note Fleet Contras Jul 07, 2019  4:08 PM)    insulin glargine (LANTUS SOLOSTAR) 100 UNIT/ML Solostar Pen [id number] Yes Inject 18-30 Units into the skin daily. Tonia Ghent, MD Taking Active   latanoprost Ivin Poot) 0.005 % ophthalmic solution [id number] Yes  [provider] Taking Active   levETIRAcetam (KEPPRA) 1000 MG tablet [id number] Yes TAKE 1 TABLET BY MOUTH TWICE A Candis Schatz, Megan, NP Taking Active   meloxicam (MOBIC) 15 MG tablet [id number] Yes TAKE 1 TABLET EVERY DAY WITH FOOD AS NEEDED FOR PAIN Tonia Ghent, MD Taking Active   metFORMIN (GLUCOPHAGE-XR) 500 MG 24 hr tablet [id number] Yes Take 500 mg by mouth in the morning and at bedtime. [provider] Taking Active Self  oxcarbazepine (TRILEPTAL) 600 MG tablet [id number] Yes TAKE 1 TABLET TWICE DAILY Ward Givens, NP Taking Active   ramipril (ALTACE) 1.25 MG capsule [id number] Yes TAKE 1 CAPSULE BY MOUTH DAILY Tonia Ghent, MD Taking Active   TRUE METRIX BLOOD GLUCOSE TEST test strip [id number] Yes USE AS DIRECTED  TO TEST BLOOD SUGAR ONE TIME DAILY  OR AS NEEDED Tonia Ghent, MD Taking Active   TRUEPLUS LANCETS 33G [place] [id number] Yes Use as instructed to test blood sugar once daily or as needed.  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 Tonia Ghent, MD Taking Active             Patient Active Problem List   Diagnosis Date Noted   Other social stressor 02/27/2021   Hiccups 01/23/2021   Colon cancer screening 04/11/2020   Medicare annual wellness visit,  subsequent 11/03/2019   HLD (hyperlipidemia) 11/03/2019   Erectile dysfunction 07/10/2019   Right foot  pain 05/01/2019   Advance care planning 10/28/2018   Mood change 10/28/2018   Shoulder pain 10/28/2018   Hyperplasia, fatty tissue 10/28/2018   Health care maintenance 10/26/2018   Partial symptomatic epilepsy with complex partial seizures, not intractable, with status epilepticus ([place]) 09/09/2018   Changes in vision 09/09/2018   Urinary symptom or sign 66/29/4765   Umbilical hernia without obstruction and without gangrene 04/12/2017   Itching 04/12/2017   Cerebral palsy, hemiplegic ([place]) 03/10/2017   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[place]) 03/10/2017   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   LLQ pain 01/11/2014   Diabetes mellitus type 2 with complications, uncontrolled ([place]) 07/17/2013   Cerebral palsy ([place]) 05/11/2011   Hearing loss 04/17/2011   Rash 04/17/2011   Skin lesion 04/17/2011   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[place]) 03/02/2007   STROKE 03/02/2007    Immunization History  Administered Date(s) Administered   PFIZER(Purple Top)SARS-COV-2 Vaccination 12/03/2019, 01/03/2020, 11/17/2020    Conditions to be addressed/monitored:  Hyperlipidemia and Diabetes  Care Plan : [place]  Updates made by Debbora Dus, [place] since 04/03/2021 12:00 AM     Problem: CHL AMB "PATIENT-SPECIFIC PROBLEM"      Long-Range Goal: Disease Management   Start Date: 04/03/2021  Priority: High  Note:   Current Barriers:  Unable to achieve control of diabetes   Pharmacist Clinical Goal(s):  Patient will contact provider office for questions/concerns as evidenced notation of same in electronic health record through collaboration with PharmD and provider.   Interventions: 1:1 collaboration with Tonia Ghent, MD regarding development and update of comprehensive plan of care as evidenced by provider attestation and co-signature Inter-disciplinary care team collaboration (see longitudinal plan of care) Comprehensive medication review performed; medication list updated in electronic  medical record  Hyperlipidemia: (LDL goal < 70) -Controlled - LDL 69 -Current treatment: Atorvastatin 20 mg - 1 tablet daily  -Medications previously tried: none reported  -Reviewed adherence, denies missed doses. Refills timely.  -Educated on Cholesterol goals;  -Recommended to continue current medication  Diabetes (A1c goal <7%) -Not ideally controlled - A1c 10.1% -Current medications: Lantus - Inject 33 units daily (dose confirmed by patient) If BG > 140, adds 1 units each night per patient report Metformin 500 mg ER - 1 tablet twice daily -Medications previously tried: GI upset with more than 1000 mg metformin/day -Current home glucose readings - checking once every morning and occasionally at bedtime fasting glucose: 197 (this morning), 185, 178, 173,  194 post prandial glucose: checks at bedtime, does not have any readings to report Denies any hypoglycemia  -Denies hypoglycemic/hyperglycemic symptoms - concerned his rash is related to high BG -Affordability-  Patient completed both Trulicity and Lantus PAP applications yesterday, dropped off at office and enclosed the proof of income -Current meal patterns: eats three meals a day breakfast: spaghetti and meatballs/leftovers for breakfast  dinner: a lot of frozen meals from [employer] - lasagne, meatloaf, macaroni and cheese (whole wheat pasta); does not like any vegetables, prefers steak/beef, does not like chicken/pork/fish  snacks: limits sweets, eats fruit as snack - apple, grape, jello (not sugar free) drinks: trying to switch all sodas to flavored water or diet soda, still has regular soda on occasion -Current exercise: minimal -ACE-I: Yes- ramipril -Educated on A1c and blood sugar goals; -Counseled to check feet daily and get yearly eye exams - denies any foot concerns today -Discussed watching portions since  his meals are very carbohydrate heavy. Continue to limit sugary drinks/soda. -Recommended to continue current  medication; Increase Lantus to 36 units this evening. Will fax PAPs to manufacturer this week.  Patient Goals/Self-Care Activities Patient will:  - take medications as prescribed check glucose daily, document, and provide at future appointments engage in dietary modifications by avoiding soda and limiting portion sizes  Follow Up Plan: Telephone follow up appointment with care management team member scheduled for:  1 week for BG log      Medication Assistance: Trulicity and Lantus PAPs mailed to patient on 03/05/21. Pt brought into office on 04/02/21. Awaiting PCP signature and fax.  Compliance/Adherence/Medication fill history: Care Gaps: TDAP  Star-Rating Drugs: Medication:                Last Fill:         Day Supply Ramipril 1.$RemoveBeforeDEI'25mg'[id number]$           12/27/20            90 Lantus                         01/23/21            90 Metformin XR $RemoveBefo'500mg'clmubErFRoS$   09/01/20          90 Atorvastatin $RemoveBeforeD'20mg'uRaURJxIJtsfpu$        12/24/20            90  Dr. Damita Dunnings had the patient off metformin for over a month to calm down the GI system and now the patient is only taking 1 tablet 2 times daily. Still has a full bottle from 08/2020. Filled ramipril and atorvastatin 03/25/21 according to patient.  Patient's preferred pharmacy is: [facility] - [place], [place] [address] Phone: [phone] Fax: [phone]  Using Mail Order for everything except ramipril [facility]. [facility] over [facility] Uses pill box? Yes - uses one for morning and bedtime  Pt endorses 100% compliance  We discussed: Current pharmacy is preferred with insurance plan and patient is satisfied with pharmacy services Patient decided to: Continue current medication management strategy  Care Plan and Follow Up Patient Decision:  Patient agrees to Care Plan and Follow-up.  Debbora Dus, PharmD Clinical  Pharmacist [facility] at [facility] [phone]

## 2021-04-11 ENCOUNTER — Telehealth: Payer: Self-pay | Admitting: Family Medicine

## 2021-04-11 NOTE — Telephone Encounter (Signed)
Mrs.  Minzey called in wanted to know about getting medication lantis, not sure if its going to the home or picking up in office

## 2021-04-12 NOTE — Telephone Encounter (Signed)
Patient made aware he will need to pick up at the office. He will come by next week.  Debbora Dus, PharmD Clinical Pharmacist [facility] at [facility] [phone]

## 2021-04-15 ENCOUNTER — Ambulatory Visit (INDEPENDENT_AMBULATORY_CARE_PROVIDER_SITE_OTHER): Payer: Medicare HMO | Admitting: Psychologist

## 2021-04-15 DIAGNOSIS — F321 Major depressive disorder, single episode, moderate: Secondary | ICD-10-CM | POA: Diagnosis not present

## 2021-04-16 ENCOUNTER — Telehealth: Payer: Self-pay | Admitting: Family Medicine

## 2021-04-16 NOTE — Telephone Encounter (Addendum)
Pt came to clinic today to pick up his pt assistance medication, lantus, 300IU/41m, which he receives through [employer]. Medication was given to pt by front office and pt inquired how he would receive refills and if he needed to contact office when he needed further refills.   Sending msg to CMA to f/u with pt concerning questions about refills.   Packing slip that came with medication in shipping has been copied and placed for scan and one copy given to CElsinore

## 2021-04-16 NOTE — Telephone Encounter (Signed)
error 

## 2021-04-18 NOTE — Telephone Encounter (Signed)
Spoke with patient about his refills and advised patient we will contact him once his next shipment arrives.

## 2021-04-24 LAB — HM DIABETES EYE EXAM

## 2021-04-29 ENCOUNTER — Ambulatory Visit (INDEPENDENT_AMBULATORY_CARE_PROVIDER_SITE_OTHER): Payer: Medicare HMO | Admitting: Psychologist

## 2021-04-29 DIAGNOSIS — F321 Major depressive disorder, single episode, moderate: Secondary | ICD-10-CM | POA: Diagnosis not present

## 2021-05-12 ENCOUNTER — Ambulatory Visit (INDEPENDENT_AMBULATORY_CARE_PROVIDER_SITE_OTHER): Payer: Medicare HMO

## 2021-05-12 DIAGNOSIS — Z0000 Encounter for general adult medical examination without abnormal findings: Secondary | ICD-10-CM

## 2021-05-12 NOTE — Progress Notes (Signed)
Subjective:   Bradley Davila is a 60 y.o. male who presents for Medicare Annual/Subsequent preventive examination.  I connected with  Langley Adie on 05/12/21 by a audio enabled telemedicine application and verified that I am speaking with the correct person using two identifiers.  I discussed the limitations of evaluation and management by telemedicine. The patient expressed understanding and agreed to proceed.   Location of patient: HOME Location of Provider: OFFICE Persons participating in Virtual Visit: Carlitos Bottino, and his wife Bion Todorov.   Review of Systems    DEFER TO PCP  Cardiac Risk Factors include: none     Objective:    Today's Vitals   05/12/21 0847  PainSc: 0-No pain   There is no height or weight on file to calculate BMI.  Advanced Directives 07/01/2019 06/10/2019 10/26/2018 10/16/2017 08/05/2017 08/03/2017 06/07/2014  Does Patient Have a Medical Advance Directive? No No No No No No No  Does patient want to make changes to medical advance directive? - - - - - No - Patient declined -  Would patient like information on creating a medical advance directive? No - Patient declined No - Patient declined Yes (MAU/Ambulatory/Procedural Areas - Information given) No - Patient declined No - Patient declined - No - patient declined information    Current Medications (verified) Outpatient Encounter Medications as of 05/12/2021  Medication Sig   acetaminophen (TYLENOL) 325 MG tablet You can take 2 tablets every 6 hours as needed for pain. Use this first then the prescribed pain medication.   DO NOT TAKE MORE THAN 4000 MG OF TYLENOL PER DAY.  IT CAN HARM YOUR LIVER.   atorvastatin (LIPITOR) 20 MG tablet TAKE 1 TABLET EVERY DAY   EPINEPHrine 0.3 mg/0.3 mL IJ SOAJ injection Inject 0.3 mg into the muscle as needed.   insulin glargine (LANTUS SOLOSTAR) 100 UNIT/ML Solostar Pen Inject 18-30 Units into the skin daily. (Patient taking differently: Inject 38 Units into the skin  daily.)   latanoprost (XALATAN) 0.005 % ophthalmic solution    meloxicam (MOBIC) 15 MG tablet TAKE 1 TABLET EVERY DAY WITH FOOD AS NEEDED FOR PAIN   metFORMIN (GLUCOPHAGE-XR) 500 MG 24 hr tablet Take 500 mg by mouth in the morning and at bedtime.   oxcarbazepine (TRILEPTAL) 600 MG tablet TAKE 1 TABLET TWICE DAILY   ramipril (ALTACE) 1.25 MG capsule TAKE 1 CAPSULE BY MOUTH DAILY   TRUE METRIX BLOOD GLUCOSE TEST test strip USE AS DIRECTED  TO TEST BLOOD SUGAR ONE TIME DAILY  OR AS NEEDED   TRUEPLUS LANCETS 33G MISC Use as instructed to test blood sugar once daily or as needed.  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   levETIRAcetam (KEPPRA) 1000 MG tablet TAKE 1 TABLET BY MOUTH TWICE A DAY   No facility-administered encounter medications on file as of 05/12/2021.    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   Diabetes mellitus without complication (HCC)    Dysphagia as late effect of stroke    Headache(784.0)    Hypertension    Seizures (HCC)    h/o grand mal and staring episodes, followed by [facility] neuro, last grand mal July 09, 2013 last seizure   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    Stroke [facility])    childhood   Past Surgical History:  Procedure Laterality Date   CHOLECYSTECTOMY  2007   CYSTECTOMY     on buttock, s/p removal   HERNIA REPAIR     INSERTION OF MESH N/A 08/05/2017   Procedure: INSERTION OF MESH;  Surgeon: Coralie Keens, MD;  Location: [facility];  Service: General;  Laterality: N/A;   TONSILLECTOMY     UMBILICAL HERNIA REPAIR  35/57/3220   UMBILICAL HERNIA REPAIR N/A 08/05/2017    Procedure: UMBILICAL HERNIA REPAIR;  Surgeon: Coralie Keens, MD;  Location: [facility];  Service: General;  Laterality: N/A;   WISDOM TOOTH EXTRACTION     Family History  Problem Relation Age of Onset   Hydrocephalus Mother    Liver cancer Father    Alcohol abuse Father    Alcohol abuse Sister    Heart disease Brother    Colon cancer Neg Hx    Prostate cancer Neg Hx    Social History   Socioeconomic History   Marital status: Married    Spouse name: Clarene Critchley   Number of children: 1   Years of education: Not on file   Highest education level: Not on file  Occupational History    Employer: NOT EMPLOYED  Tobacco Use   Smoking status: Some Days    Types: Cigarettes   Smokeless tobacco: Never  Vaping Use   Vaping Use: Never used  Substance and Sexual Activity   Alcohol use: Yes    Comment: rarely - 4 to 5 times per year   Drug use: No   Sexual activity: Yes  Other Topics Concern   Not on file  Social History Narrative   Married 2005, 1 daughter   Disabled due to cva   Social Determinants of Health   Financial Resource Strain: High Risk   Difficulty of Paying Living Expenses: Hard  Food Insecurity: No Food Insecurity   Worried About Charity fundraiser in the Last Year: Never true   Arboriculturist in the Last Year: Never true  Transportation Needs: No Transportation Needs   Lack of Transportation (Medical): No   Lack of Transportation (Non-Medical): No  Physical Activity: Insufficiently Active   Days of Exercise per Week: 3 days   Minutes of Exercise per Session: 20 min  Stress: No Stress Concern Present   Feeling of Stress : Only a little  Social Connections: Socially Isolated   Frequency of Communication with Friends and Family: Once a week   Frequency of Social Gatherings with Friends and Family: Once a week   Attends Religious Services: Never   Marine scientist or Organizations: No   Attends Music therapist: Never   Marital Status: Married     Tobacco Counseling Ready to quit: No Counseling given: Yes   Clinical Intake:  Pre-visit preparation completed: Yes  Pain : No/denies pain Pain Score: 0-No pain Pain Location: Hand Pain Orientation: Right, Left Pain Descriptors / Indicators: Aching, Burning Pain Onset: 1 to 4 weeks ago Pain Frequency: Once a week     Diabetes: Yes CBG done?: No CBG resulted in Enter/ Edit results?: No Did pt. bring in CBG monitor from home?: No     Diabetic? YES- Blood Sugar this morning was 121 fasting.  Interpreter Needed?: No  Information entered by :: Wyatt Haste, [place]   Activities of Daily Living  In your present state of health, do you have any difficulty performing the following activities: 05/12/2021 05/28/2020  Hearing? Y Y  Comment - stroke at birth  Vision? N Y  Comment - stroke at birth  Difficulty concentrating or making decisions? N Y  Comment - stroke at birth  Walking or climbing stairs? N N  Dressing or bathing? N N  Doing errands, shopping? N Y  Comment - wife assists  Conservation officer, nature and eating ? N N  Using the Toilet? N N  In the past six months, have you accidently leaked urine? Y N  Do you have problems with loss of bowel control? N N  Managing your Medications? N Y  Comment - wife handles  Managing your Finances? N Y  Comment - wife Estate manager/land agent or managing your Housekeeping? N -  Some recent data might be hidden    Patient Care Team: Tonia Ghent, MD as PCP - General (Family Medicine) Jon Billings, RN as [place] Management Debbora Dus, [facility] as Pharmacist (Pharmacist)  Indicate any recent Medical Services you may have received from other than [facility] providers in the past year (date may be approximate).     Assessment:   This is a routine wellness examination for [facility].  Hearing/Vision screen No concerns at this time.  Dietary issues and exercise activities discussed: Current Exercise Habits: Home  exercise routine, Time (Minutes): 30, Frequency (Times/Week): 1, Weekly Exercise (Minutes/Week): 30, Intensity: Mild   Goals Addressed   None    Depression Screen PHQ 2/9 Scores 05/12/2021 11/23/2020 05/28/2020 02/27/2020 11/09/2019 07/01/2019 06/10/2019  PHQ - 2 Score 4 0 0 $R'1 2 1 1  'Ov$ PHQ- 9 Score 4 - - - 10 - -    Fall Risk Fall Risk  05/12/2021 11/23/2020 10/15/2020 05/28/2020 02/27/2020  Falls in the past year? 1 0 1 0 0  Number falls in past yr: 1 - 0 - -  Injury with Fall? 1 - 1 - -  Risk for fall due to : History of fall(s) - History of fall(s) - -  Follow up Falls evaluation completed - Falls evaluation completed - -    FALL RISK PREVENTION PERTAINING TO THE HOME:  Any stairs in or around the home? Yes  If so, are there any without handrails? No  Home free of loose throw rugs in walkways, pet beds, electrical cords, etc? No  Adequate lighting in your home to reduce risk of falls? Yes   ASSISTIVE DEVICES UTILIZED TO PREVENT FALLS:  Life alert? No  Use of a cane, walker or w/c? No  Grab bars in the bathroom? No  Shower chair or bench in shower? No  Elevated toilet seat or a handicapped toilet? No   TIMED UP AND GO:  Was the test performed?  N/A .  Length of time to ambulate 10 feet: N/A sec.    Cognitive Function: MMSE - Mini Mental State Exam 10/26/2018 10/16/2017  Orientation to time 5 5  Orientation to Place 5 5  Registration 3 3  Attention/ Calculation 0 0  Recall 2 3  Recall-comments unable to recall 1 of 3 words -  Language- name 2 objects 0 0  Language- repeat 1 1  Language- follow 3 step command 3 3  Language- read & follow direction 0 0  Write a sentence 0 0  Copy design 0 0  Total score 19 20     6CIT Screen 05/12/2021  What Year? 0 points  What month? 0 points  What time? 0 points  Count back from 20 0 points  Months in reverse 0 points  Repeat phrase 0 points  Total Score 0    Immunizations Immunization History  Administered Date(s) Administered    PFIZER(Purple Top)SARS-COV-2 Vaccination 12/03/2019, 01/03/2020, 11/17/2020    TDAP status: Due, Education has been provided regarding the importance of this vaccine. Advised may receive this vaccine at local pharmacy or Health Dept. Aware to provide a copy of the vaccination record if obtained from local pharmacy or Health Dept. Verbalized acceptance and understanding.  Flu Vaccine status: Due, Education has been provided regarding the importance of this vaccine. Advised may receive this vaccine at local pharmacy or Health Dept. Aware to provide a copy of the vaccination record if obtained from local pharmacy or Health Dept. Verbalized acceptance and understanding.  Pneumococcal vaccine status: Due, Education has been provided regarding the importance of this vaccine. Advised may receive this vaccine at local pharmacy or Health Dept. Aware to provide a copy of the vaccination record if obtained from local pharmacy or Health Dept. Verbalized acceptance and understanding.  Covid-19 vaccine status: Completed vaccines  Qualifies for Shingles Vaccine? Yes   Zostavax completed No   Shingrix Completed?: No.    Education has been provided regarding the importance of this vaccine. Patient has been advised to call insurance company to determine out of pocket expense if they have not yet received this vaccine. Advised may also receive vaccine at local pharmacy or Health Dept. Verbalized acceptance and understanding.  Screening Tests Health Maintenance  Topic Date Due   PNEUMOCOCCAL POLYSACCHARIDE VACCINE AGE 48-64 HIGH RISK  Never done   Pneumococcal Vaccine 45-48 Years old (1 - PCV) Never done   Zoster Vaccines- Shingrix (1 of 2) Never done   COLONOSCOPY (Pts 45-55yrs Insurance coverage will need to be confirmed)  06/17/2019   TETANUS/TDAP  06/09/2020   OPHTHALMOLOGY EXAM  11/16/2020   COVID-19 Vaccine (4 - Booster for Pfizer series) 03/19/2021   INFLUENZA VACCINE  04/15/2021   HEMOGLOBIN A1C   08/28/2021   FOOT EXAM  02/26/2022   Hepatitis C Screening  Completed   HIV Screening  Completed   HPV VACCINES  Aged Out    Health Maintenance  Health Maintenance Due  Topic Date Due   PNEUMOCOCCAL POLYSACCHARIDE VACCINE AGE 48-64 HIGH RISK  Never done   Pneumococcal Vaccine 101-61 Years old (1 - PCV) Never done   Zoster Vaccines- Shingrix (1 of 2) Never done   COLONOSCOPY (Pts 45-44yrs Insurance coverage will need to be confirmed)  06/17/2019   TETANUS/TDAP  06/09/2020   OPHTHALMOLOGY EXAM  11/16/2020   COVID-19 Vaccine (4 - Booster for Pfizer series) 03/19/2021   INFLUENZA VACCINE  04/15/2021   Colorectal Screening: Patient declines.   Lung Cancer Screening: (Low Dose CT Chest recommended if Age 27-80 years, 30 pack-year currently smoking OR have quit w/in 15years.) does qualify.   Lung Cancer Screening Referral: Patient declined Additional Screening:  Hepatitis C Screening: does qualify; Completed 09-16-2003  Vision Screening: Recommended annual ophthalmology exams for early detection of glaucoma and other disorders of the eye. Is the patient up to date with their annual eye exam?  Yes   Dental Screening: Recommended annual dental exams for proper oral hygiene  Community Resource Referral / Chronic Care Management: CRR required this visit?  No   CCM required this visit?  No      Plan:     I have personally reviewed and  noted the following in the patient's chart:   Medical and social history Use of alcohol, tobacco or illicit drugs  Current medications and supplements including opioid prescriptions. Patient is not currently taking opioid prescriptions. Functional ability and status Nutritional status Physical activity Advanced directives List of other physicians Hospitalizations, surgeries, and ER visits in previous 12 months Vitals Screenings to include cognitive, depression, and falls Referrals and appointments  In addition, I have reviewed and discussed  with patient certain preventive protocols, quality metrics, and best practice recommendations. A written personalized care plan for preventive services as well as general preventive health recommendations were provided to patient.     Mariel Sleet, CMA   05/12/2021   Nurse Notes: Non-face to face 15 minute visit encounter. Patient and wife would like a referral to dermatology for the painful rash on both hands. Itches at times, and can crack and bleed. Previous treatments have not worked.   Mr. Kluender , Thank you for taking time to come for your Medicare Wellness Visit. I appreciate your ongoing commitment to your health goals. Please review the following plan we discussed and let me know if I can assist you in the future.   These are the goals we discussed:  Goals      DIET - INCREASE WATER INTAKE     Starting 10/26/2018, I will attempt to drink at least 6 glasses of water daily.      THN-Monitor and Manage My Blood Sugar-Diabetes Type 2     Timeframe:  Long-Range Goal Priority:  High Start Date:    12/10//21                         Expected End Date: 09/14/21  Follow up date: 06/14/21               - check blood sugar at prescribed times - take the blood sugar log to all doctor visits    Why is this important?   Checking your blood sugar at home helps to keep it from getting very high or very low.  Writing the results in a diary or log helps the doctor know how to care for you.  Your blood sugar log should have the time, date and the results.  Also, write down the amount of insulin or other medicine that you take.  Other information, like what you ate, exercise done and how you were feeling, will also be helpful.     Notes: Patient reports blood sugars higher right now with today's reading 212.  Discussed importance of diet. Follow up with physician. 02/22/21 Patient reports blood sugars in the 200's.  Last A1c was 7.3 Reviewed importance of limiting carbs such as rice,  potatoes, breads, and pastas. Also discussed limiting sweets and sugary drinks.  Discussed importance of portion control.  Also discussed importance of annual exams such as mammogram and eye exam.          This is a list of the screening recommended for you and due dates:  Health Maintenance  Topic Date Due   Pneumococcal vaccine  Never done   Pneumococcal Vaccination (1 - PCV) Never done   Zoster (Shingles) Vaccine (1 of 2) Never done   Colon Cancer Screening  06/17/2019   Tetanus Vaccine  06/09/2020   Eye exam for diabetics  11/16/2020   COVID-19 Vaccine (4 - Booster for Pfizer series) 03/19/2021   Flu Shot  04/15/2021   Hemoglobin A1C  08/28/2021   Complete foot exam   02/26/2022   Hepatitis C Screening: USPSTF Recommendation to screen - Ages 41-79 yo.  Completed   HIV Screening  Completed   HPV Vaccine  Aged Out

## 2021-05-14 ENCOUNTER — Ambulatory Visit (INDEPENDENT_AMBULATORY_CARE_PROVIDER_SITE_OTHER): Payer: Medicare HMO | Admitting: Psychologist

## 2021-05-14 DIAGNOSIS — F321 Major depressive disorder, single episode, moderate: Secondary | ICD-10-CM | POA: Diagnosis not present

## 2021-05-17 ENCOUNTER — Telehealth: Payer: Self-pay | Admitting: Family Medicine

## 2021-05-17 DIAGNOSIS — R21 Rash and other nonspecific skin eruption: Secondary | ICD-10-CM

## 2021-05-17 NOTE — Telephone Encounter (Signed)
Patient and wife would like a referral to dermatology for the painful rash on both hands. Itches at times, and can crack and bleed. Previous treatments have not worked.  Please let them know that I put in the referral.  Thanks.

## 2021-05-21 NOTE — Telephone Encounter (Signed)
Patient and wife have been notified that the referrals have been done. Also gave them the name, number and address of the office referral was sent to.

## 2021-05-22 ENCOUNTER — Other Ambulatory Visit: Payer: Self-pay

## 2021-05-22 NOTE — Patient Outreach (Signed)
[facility] Care Management  [place]  05/22/2021   MAXIMOS KRALICEK 1960/12/01 [id number]  Subjective: Telephone call to patient for diabetes management. Patient reports doing better with sugars.  Reviewed diabetes management and encouraged to keep up the great work.  Objective:   Encounter Medications:  Outpatient Encounter Medications as of 05/22/2021  Medication Sig   acetaminophen (TYLENOL) 325 MG tablet You can take 2 tablets every 6 hours as needed for pain. Use this first then the prescribed pain medication.   DO NOT TAKE MORE THAN 4000 MG OF TYLENOL PER DAY.  IT CAN HARM YOUR LIVER.   atorvastatin (LIPITOR) 20 MG tablet TAKE 1 TABLET EVERY DAY   EPINEPHrine 0.3 mg/0.3 mL IJ SOAJ injection Inject 0.3 mg into the muscle as needed.   insulin glargine (LANTUS SOLOSTAR) 100 UNIT/ML Solostar Pen Inject 18-30 Units into the skin daily. (Patient taking differently: Inject 38 Units into the skin daily.)   latanoprost (XALATAN) 0.005 % ophthalmic solution    levETIRAcetam (KEPPRA) 1000 MG tablet TAKE 1 TABLET BY MOUTH TWICE A DAY   meloxicam (MOBIC) 15 MG tablet TAKE 1 TABLET EVERY DAY WITH FOOD AS NEEDED FOR PAIN   metFORMIN (GLUCOPHAGE-XR) 500 MG 24 hr tablet Take 500 mg by mouth in the morning and at bedtime.   oxcarbazepine (TRILEPTAL) 600 MG tablet TAKE 1 TABLET TWICE DAILY   ramipril (ALTACE) 1.25 MG capsule TAKE 1 CAPSULE BY MOUTH DAILY   TRUE METRIX BLOOD GLUCOSE TEST test strip USE AS DIRECTED  TO TEST BLOOD SUGAR ONE TIME DAILY  OR AS NEEDED   TRUEPLUS LANCETS 33G MISC Use as instructed to test blood sugar once daily or as needed.  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   No facility-administered encounter medications on file as of 05/22/2021.    Functional Status:  In your present state of health, do you have any difficulty performing the following activities: 05/12/2021 05/28/2020  Hearing? Y Y  Comment - stroke at birth  Vision? N Y  Comment -  stroke at birth  Difficulty concentrating or making decisions? N Y  Comment - stroke at birth  Walking or climbing stairs? N N  Dressing or bathing? N N  Doing errands, shopping? N Y  Comment - wife assists  Conservation officer, nature and eating ? N N  Using the Toilet? N N  In the past six months, have you accidently leaked urine? Y N  Do you have problems with loss of bowel control? N N  Managing your Medications? N Y  Comment - wife handles  Managing your Finances? N Y  Comment - wife Estate manager/land agent or managing your Housekeeping? N -  Some recent data might be hidden    Fall/Depression Screening: Fall Risk  05/12/2021 11/23/2020 10/15/2020  Falls in the past year? 1 0 1  Number falls in past yr: 1 - 0  Injury with Fall? 1 - 1  Risk for fall due to : History of fall(s) - History of fall(s)  Follow up Falls evaluation completed - Falls evaluation completed   PHQ 2/9 Scores 05/12/2021 11/23/2020 05/28/2020 02/27/2020 11/09/2019 07/01/2019 06/10/2019  PHQ - 2 Score 4 0 0 '1 2 1 1  '$ PHQ- 9 Score 4 - - - 10 - -    Assessment:   Care Plan Care Plan : Diabetes Type 2 (Adult)  Updates made by Jon Billings, RN since 05/22/2021 12:00 AM     Problem: Disease Progression (Diabetes, Type  2)      Long-Range Goal: Disease Progression Prevented or Minimized as evidenced by A1c less than 8.   Start Date: 08/24/2020  Expected End Date: 12/13/2021  This Visit's Progress: Not on track  Recent Progress: On track  Priority: High  Note:   Evidence-based guidance:  Encourage lifestyle changes, such as increased intake of plant-based foods, stress reduction, consistent physical activity, and smoking cessation to prevent  long-term complications and chronic diseases.  Individualize activity and exercise recommendations while considering  potential limitations, such as neuropathy, retinopathy, or the ability to prevent  hyperglycemia or hypoglycemia. Notes:     Task: Monitor and Manage Follow-up for  Comorbidities   Due Date: 09/14/2021  Priority: Routine  Responsible User: Jon Billings, RN  Note:   Care Management Activities:    - activity based on tolerance and functional limitations encouraged - healthy lifestyle promoted - response to pharmacologic therapy monitored    Notes: 05/22/21 Patient reports sugars are much better with report of 123 today.  Encouraged to continue progress. Diabetes Management Discussed: Medication adherence Reviewed importance of limiting carbs such as rice, potatoes, breads, and pastas. Also discussed limiting sweets and sugary drinks.  Discussed importance of portion control.  Also discussed importance of annual exams, foot exams, and eye exams.        Goals Addressed               This Visit's Progress     THN-Monitor and Manage My Blood Sugar-Diabetes Type 2 (pt-stated)   On track     Timeframe:  Long-Range Goal Priority:  High Start Date:    12/10//21                         Expected End Date: 12/13/21  Follow up date: 09/14/21        - check blood sugar at prescribed times - take the blood sugar log to all doctor visits    Why is this important?   Checking your blood sugar at home helps to keep it from getting very high or very low.  Writing the results in a diary or log helps the doctor know how to care for you.  Your blood sugar log should have the time, date and the results.  Also, write down the amount of insulin or other medicine that you take.  Other information, like what you ate, exercise done and how you were feeling, will also be helpful.     Notes: Patient reports blood sugars higher right now with today's reading 212.  Discussed importance of diet. Follow up with physician. 02/22/21 Patient reports blood sugars in the 200's.  Last A1c was 7.3 Reviewed importance of limiting carbs such as rice, potatoes, breads, and pastas. Also discussed limiting sweets and sugary drinks.  Discussed importance of portion control.  Also  discussed importance of annual exams such as mammogram and eye exam.   05/22/21 Patient reports sugars are much better. Encouraged to continue progress.  Reviewed diabetes management.         Plan:  Follow-up: Patient agrees to Care Plan and Follow-up. Follow-up in 3 month(s)  [name], RN, MSN [facility] Care Management Coordinator Direct Line [phone] Cell [phone] Toll Free: [phone]  Fax: [phone]

## 2021-05-22 NOTE — Patient Instructions (Signed)
Goals Addressed               This Visit's Progress     THN-Monitor and Manage My Blood Sugar-Diabetes Type 2 (pt-stated)   On track     Timeframe:  Long-Range Goal Priority:  High Start Date:    12/10//21                         Expected End Date: 12/13/21  Follow up date: 09/14/21        - check blood sugar at prescribed times - take the blood sugar log to all doctor visits    Why is this important?   Checking your blood sugar at home helps to keep it from getting very high or very low.  Writing the results in a diary or log helps the doctor know how to care for you.  Your blood sugar log should have the time, date and the results.  Also, write down the amount of insulin or other medicine that you take.  Other information, like what you ate, exercise done and how you were feeling, will also be helpful.     Notes: Patient reports blood sugars higher right now with today's reading 212.  Discussed importance of diet. Follow up with physician. 02/22/21 Patient reports blood sugars in the 200's.  Last A1c was 7.3 Reviewed importance of limiting carbs such as rice, potatoes, breads, and pastas. Also discussed limiting sweets and sugary drinks.  Discussed importance of portion control.  Also discussed importance of annual exams such as mammogram and eye exam.   05/22/21 Patient reports sugars are much better. Encouraged to continue progress.  Reviewed diabetes management.

## 2021-05-23 ENCOUNTER — Telehealth: Payer: Self-pay

## 2021-05-23 NOTE — Chronic Care Management (AMB) (Addendum)
Chronic Care Management Pharmacy Assistant   Name: Bradley Davila  MRN: [id number] DOB: 06/12/1961  Reason for Encounter: Diabetes Review   Recent office visits:  05/22/21 [facility] - Diabetes review, patient doing well 05/12/21 - Family Medicine - Telephone encounter for AWV  Recent consult visits:  None since last CCM contact  Hospital visits:  None in previous 6 months  Medications: Outpatient Encounter Medications as of 05/23/2021  Medication Sig   acetaminophen (TYLENOL) 325 MG tablet You can take 2 tablets every 6 hours as needed for pain. Use this first then the prescribed pain medication.   DO NOT TAKE MORE THAN 4000 MG OF TYLENOL PER DAY.  IT CAN HARM YOUR LIVER.   atorvastatin (LIPITOR) 20 MG tablet TAKE 1 TABLET EVERY DAY   EPINEPHrine 0.3 mg/0.3 mL IJ SOAJ injection Inject 0.3 mg into the muscle as needed.   insulin glargine (LANTUS SOLOSTAR) 100 UNIT/ML Solostar Pen Inject 18-30 Units into the skin daily. (Patient taking differently: Inject 38 Units into the skin daily.)   latanoprost (XALATAN) 0.005 % ophthalmic solution    levETIRAcetam (KEPPRA) 1000 MG tablet TAKE 1 TABLET BY MOUTH TWICE A DAY   meloxicam (MOBIC) 15 MG tablet TAKE 1 TABLET EVERY DAY WITH FOOD AS NEEDED FOR PAIN   metFORMIN (GLUCOPHAGE-XR) 500 MG 24 hr tablet Take 500 mg by mouth in the morning and at bedtime.   oxcarbazepine (TRILEPTAL) 600 MG tablet TAKE 1 TABLET TWICE DAILY   ramipril (ALTACE) 1.25 MG capsule TAKE 1 CAPSULE BY MOUTH DAILY   TRUE METRIX BLOOD GLUCOSE TEST test strip USE AS DIRECTED  TO TEST BLOOD SUGAR ONE TIME DAILY  OR AS NEEDED   TRUEPLUS LANCETS 33G MISC Use as instructed to test blood sugar once daily or as needed.  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   No facility-administered encounter medications on file as of 05/23/2021.      Recent Relevant Labs: Lab Results  Component Value Date/Time   HGBA1C 10.1 (A) 02/26/2021 11:30 AM   HGBA1C 7.3 (A) 10/15/2020 03:41  PM   HGBA1C 7.9 (H) 10/31/2019 10:51 AM   HGBA1C 8.5 (H) 10/26/2018 08:38 AM   MICROALBUR 24.2 (H) 10/31/2019 10:51 AM   MICROALBUR 57.8 (H) 10/26/2018 08:38 AM    Kidney Function Lab Results  Component Value Date/Time   CREATININE 1.13 01/02/2021 11:50 AM   CREATININE 0.95 01/03/2020 11:30 AM   GFR 78.03 10/31/2019 10:51 AM   GFRNONAA 87 01/03/2020 11:30 AM   GFRAA 100 01/03/2020 11:30 AM     Attempted contact with Bradley Davila 3 times on 05/23/21, 05/24/21, 05/27/21. Unsuccessful outreach. Will attempt contact next month.   Current antihyperglycemic regimen:  Lantus - Inject 38 units daily  Metformin 500 mg ER - 1 tablet twice daily  What recent interventions/DTPs have been made to improve glycemic control:  Increase Lantus to 36 units in evening per CPP   Have there been any recent hospitalizations or ED visits since last visit with CPP? No  Adherence Review: Is the patient currently on a STATIN medication? Yes Is the patient currently on ACE/ARB medication? Yes Does the patient have >5 day gap between last estimated fill dates? Yes, unable to reach the patient for verification  Star Rating Drugs:  Medication:   Last Fill: Day Supply Ramipril 1.'25mg'$   03/12/21 90 Lantus    01/23/21 30 Metformin XR '500mg'$   09/01/20 90 Atorvastatin '20mg'$   03/25/21 90    PCP appointment  on 05/30/21  Debbora Dus, CPP notified  [name], [name] [phone]  I have reviewed the care management and care coordination activities outlined in this encounter and I am certifying that I agree with the content of this note. Will check on his Trulicity PAP.  Debbora Dus, PharmD Clinical Pharmacist [name] [phone]

## 2021-05-24 ENCOUNTER — Ambulatory Visit: Payer: Self-pay

## 2021-05-29 ENCOUNTER — Telehealth: Payer: Self-pay

## 2021-05-29 NOTE — Telephone Encounter (Signed)
Please check with manufacturer on status of Trulicity assistance program.  Debbora Dus, PharmD Clinical Pharmacist [place] [facility] at [facility] [phone]

## 2021-05-30 ENCOUNTER — Ambulatory Visit (INDEPENDENT_AMBULATORY_CARE_PROVIDER_SITE_OTHER): Payer: Medicare HMO | Admitting: Family Medicine

## 2021-05-30 ENCOUNTER — Encounter: Payer: Self-pay | Admitting: Family Medicine

## 2021-05-30 ENCOUNTER — Other Ambulatory Visit: Payer: Self-pay

## 2021-05-30 VITALS — BP 126/82 | HR 87 | Temp 98.2°F | Ht 66.0 in | Wt 217.0 lb

## 2021-05-30 DIAGNOSIS — R21 Rash and other nonspecific skin eruption: Secondary | ICD-10-CM | POA: Diagnosis not present

## 2021-05-30 DIAGNOSIS — E1165 Type 2 diabetes mellitus with hyperglycemia: Secondary | ICD-10-CM

## 2021-05-30 DIAGNOSIS — IMO0002 Reserved for concepts with insufficient information to code with codable children: Secondary | ICD-10-CM

## 2021-05-30 DIAGNOSIS — E118 Type 2 diabetes mellitus with unspecified complications: Secondary | ICD-10-CM | POA: Diagnosis not present

## 2021-05-30 LAB — POCT GLYCOSYLATED HEMOGLOBIN (HGB A1C): Hemoglobin A1C: 7.5 % — AB (ref 4.0–5.6)

## 2021-05-30 MED ORDER — LANTUS SOLOSTAR 100 UNIT/ML ~~LOC~~ SOPN: 38.0000 [IU] | PEN_INJECTOR | Freq: Every day | SUBCUTANEOUS | Status: DC

## 2021-05-30 MED ORDER — TRIAMCINOLONE ACETONIDE 0.5 % EX CREA: 1.0000 "application " | TOPICAL_CREAM | Freq: Two times a day (BID) | CUTANEOUS | 2 refills | Status: AC | PRN

## 2021-05-30 MED ORDER — CLOTRIMAZOLE-BETAMETHASONE 1-0.05 % EX CREA: 1.0000 "application " | TOPICAL_CREAM | Freq: Every day | CUTANEOUS | 2 refills | Status: DC

## 2021-05-30 NOTE — Progress Notes (Signed)
This visit occurred during the SARS-CoV-2 public health emergency.  Safety protocols were in place, including screening questions prior to the visit, additional usage of staff PPE, and extensive cleaning of exam room while observing appropriate contact time as indicated for disinfecting solutions.  Diabetes:  Using medications without difficulties: yes Hypoglycemic episodes:no Hyperglycemic episodes:no Feet problems:no Blood Sugars averaging: sugar 104.   He cut out soda.  Sugars usually 140 or lower.   eye exam within last year: done at screening with [facility].  04/24/21 A1c clearly improved.  Discussed with patient.  I thanked him for his effort.  H/o rash on the gluteal crease improved with clotrimazole and betamethasone.  Used prn.  Rx sent.    He had [place] f/u and has been working on strategies to deal with stressors.    Meds, vitals, and allergies reviewed.   ROS: Per HPI unless specifically indicated in ROS section   GEN: nad, alert and oriented HEENT: ncat NECK: supple w/o LA CV: rrr. PULM: ctab, no inc wob ABD: soft, +bs EXT: no edema SKIN: looks like small insect bites with papules on the trunk and arms.  Chronic B hand rash noted on the palm.

## 2021-05-30 NOTE — Patient Instructions (Addendum)
Triamcinolone for the bug bites.  Combination cream for the rash.  Take care.  Glad to see you. Schedule a physical with labs ahead of time in about 4 months.

## 2021-06-02 NOTE — Assessment & Plan Note (Signed)
2 separate issues.  He can use triamcinolone on the extremity lesions and use clotrimazole betamethasone on the gluteal irritation and update me as needed.  He agrees with plan.

## 2021-06-02 NOTE — Assessment & Plan Note (Signed)
A1c clearly improved.  Would continue as is with insulin and metformin.  Recheck periodically.  I thanked him for his effort.  He has been working on his diet and cutting out soda.

## 2021-06-03 ENCOUNTER — Ambulatory Visit: Payer: Medicare HMO | Admitting: Psychologist

## 2021-06-06 ENCOUNTER — Telehealth: Payer: Self-pay

## 2021-06-06 NOTE — Progress Notes (Addendum)
Contacted the program for Trulicity and they have not received the application for the trulicity. Also contacted the program for Lantus and the patient is currently enrolled through the end of December 2022.  Debbora Dus, CPP notified  [name], [place] Assistant [phone]

## 2021-06-10 ENCOUNTER — Other Ambulatory Visit: Payer: Self-pay | Admitting: Family Medicine

## 2021-06-10 NOTE — Telephone Encounter (Signed)
See [name] telephone note 06/06/21. Per manufacturer they have not received Trulicity PAP application. Per last PCP visit A1c 7.5% improved without starting Trulicity. Will schedule CCM visit in 4 months to follow up on diabetes and see if Trulicity needed.

## 2021-06-12 DIAGNOSIS — L308 Other specified dermatitis: Secondary | ICD-10-CM | POA: Diagnosis not present

## 2021-06-12 DIAGNOSIS — S30861A Insect bite (nonvenomous) of abdominal wall, initial encounter: Secondary | ICD-10-CM | POA: Diagnosis not present

## 2021-06-20 ENCOUNTER — Telehealth: Payer: Self-pay

## 2021-06-20 NOTE — Progress Notes (Addendum)
Chronic Care Management Pharmacy Assistant   Name: Bradley Davila  MRN: [id number] DOB: 07-26-1961  Reason for Encounter: Diabetes Disease State   Recent office visits:  05/30/2021 - Elsie Stain, MD - Patient presented for follow up for Diabetes. Lab: A1c. Start: clotrimazole-betamethasone (LOTRISONE) cream and triamcinolone cream (KENALOG) 0.5 %. Change: insulin glargine (LANTUS SOLOSTAR) 100 UNIT/ML Solostar Pen.    Recent consult visits:  None since last CCM visit  Hospital visits:  None in previous 6 months  Medications: Outpatient Encounter Medications as of 06/20/2021  Medication Sig   acetaminophen (TYLENOL) 325 MG tablet You can take 2 tablets every 6 hours as needed for pain. Use this first then the prescribed pain medication.   DO NOT TAKE MORE THAN 4000 MG OF TYLENOL PER DAY.  IT CAN HARM YOUR LIVER.   atorvastatin (LIPITOR) 20 MG tablet TAKE 1 TABLET EVERY DAY   clotrimazole-betamethasone (LOTRISONE) cream Apply 1 application topically daily.   EPINEPHrine 0.3 mg/0.3 mL IJ SOAJ injection Inject 0.3 mg into the muscle as needed.   insulin glargine (LANTUS SOLOSTAR) 100 UNIT/ML Solostar Pen Inject 38 Units into the skin daily.   latanoprost (XALATAN) 0.005 % ophthalmic solution    levETIRAcetam (KEPPRA) 1000 MG tablet TAKE 1 TABLET BY MOUTH TWICE A DAY   meloxicam (MOBIC) 15 MG tablet TAKE 1 TABLET EVERY DAY WITH FOOD AS NEEDED FOR PAIN   metFORMIN (GLUCOPHAGE-XR) 500 MG 24 hr tablet Take 500 mg by mouth in the morning and at bedtime.   oxcarbazepine (TRILEPTAL) 600 MG tablet TAKE 1 TABLET TWICE DAILY   ramipril (ALTACE) 1.25 MG capsule TAKE 1 CAPSULE BY MOUTH EVERY DAY   triamcinolone cream (KENALOG) 0.5 % Apply 1 application topically 2 (two) times daily as needed (for itchy spots on skin).   TRUE METRIX BLOOD GLUCOSE TEST test strip USE AS DIRECTED  TO TEST BLOOD SUGAR ONE TIME DAILY  OR AS NEEDED   TRUEPLUS LANCETS 33G MISC Use as instructed to test blood sugar  once daily or as needed.  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   No facility-administered encounter medications on file as of 06/20/2021.   Recent Relevant Labs: Lab Results  Component Value Date/Time   HGBA1C 7.5 (A) 05/30/2021 11:03 AM   HGBA1C 10.1 (A) 02/26/2021 11:30 AM   HGBA1C 7.9 (H) 10/31/2019 10:51 AM   HGBA1C 8.5 (H) 10/26/2018 08:38 AM   MICROALBUR 24.2 (H) 10/31/2019 10:51 AM   MICROALBUR 57.8 (H) 10/26/2018 08:38 AM    Kidney Function Lab Results  Component Value Date/Time   CREATININE 1.13 01/02/2021 11:50 AM   CREATININE 0.95 01/03/2020 11:30 AM   GFR 78.03 10/31/2019 10:51 AM   GFRNONAA 87 01/03/2020 11:30 AM   GFRAA 100 01/03/2020 11:30 AM   Contacted patient on 06/20/2021 to discuss diabetes disease state.   Current antihyperglycemic regimen:  Metformin 500 mg ER - 1 tablet twice daily Insulin glargine (LANTUS SOLOSTAR) 100 UNIT/ML Solostar Pen - 38 units daily   Patient verbally confirms he is taking the above medications as directed. Yes  What diet changes have been made to improve diabetes control? None  What recent interventions/DTPs have been made to improve glycemic control:  Increase: insulin glargine (LANTUS SOLOSTAR) 100 UNIT/ML Solostar Pen  Have there been any recent hospitalizations or ED visits since last visit with CPP? No  Patient denies hypoglycemic symptoms, including Pale, Sweaty, Shaky, Hungry, Nervous/irritable, and Vision changes  Patient denies hyperglycemic symptoms, including blurry vision, excessive  thirst, fatigue, polyuria, and weakness  How often are you checking your blood sugar? Patient stated he checks it every morning before he eats, but does not write it down. Does not remember what it has been .   During the week, how often does your blood glucose drop below 70? Never  Are you checking your feet daily/regularly? Yes  Adherence Review: Is the patient currently on a STATIN medication? Yes Is the patient currently  on ACE/ARB medication? Yes Does the patient have >5 day gap between last estimated fill dates? Yes  Care Gaps: Annual wellness visit in last year? No 11/03/2019 Most recent A1C reading: 7.5 on 05/30/2021 Most Recent BP reading: 126/82 on 05/30/2021  Last eye exam / retinopathy screening: 04/24/2021 Last diabetic foot exam: 02/26/2021  Counseled patient on importance of annual eye and foot exam.   Star Rating Drugs:  Medication:   Last Fill: Day Supply Ramipril 1.25mg            06/15/2021      90  [facility] Lantus                                     01/23/2201 90  [facility] Metformin XR 500mg               09/01/2020 90  [facility] Atorvastatin 20mg   [facility]/25/2021 [facility]   [facility] and [place] to get exact fill dates. Lantus and  Metformin dates are from [facility] as [facility] stated they don't fill it. Patient stated he is using/taking both and the doctor's office is giving it to him.   Patient stated he is having a rough week as his wife's mother passed away.   No appointments scheduled within the next 30 days.  Debbora Dus, CPP notified  Marijean Niemann, [place] Clinical Pharmacy Assistant [phone]   I have reviewed the care management and care coordination activities outlined in this encounter and I am certifying that I agree with the content of this note. No further action required.  Debbora Dus, PharmD Clinical Pharmacist [place] [facility] at [facility] [phone]

## 2021-07-08 ENCOUNTER — Encounter: Payer: Self-pay | Admitting: Family Medicine

## 2021-07-09 ENCOUNTER — Telehealth: Payer: Self-pay

## 2021-07-09 NOTE — Telephone Encounter (Signed)
We will need to submit the refill request form and the letter of address change by fax to [facility] patient assistance program. I can send you these by Teams.

## 2021-07-09 NOTE — Telephone Encounter (Signed)
Forms have been done and signed and faxed

## 2021-07-18 ENCOUNTER — Telehealth: Payer: Self-pay

## 2021-07-18 NOTE — Progress Notes (Signed)
    Chronic Care Management Pharmacy Assistant   Name: Bradley Davila  MRN: [id number] DOB: June 21, 1961   Reason for Encounter: CCM (Lantus - [employer] 2023 Patient Assistance)  07/18/2021 - Patient assistance forms for Lantus ([employer]) have been completed and uploaded. Called patient to inform him; patient would like forms mailed.   Time Spent: 20 Minutes  Debbora Dus, CPP notified  Marijean Niemann, [place] Assistant [phone]

## 2021-07-19 NOTE — Progress Notes (Signed)
    Chronic Care Management Pharmacy Assistant   Name: SUHAAS AGENA  MRN: [id number] DOB: 11-Nov-1960  Reason for Encounter: Lantus (refill) patient assistance ([employer])  07/19/2021 - Called [employer]; they have not received the refill request that we faxed. Please re-fax to [phone].  Time Spent: 3 Minutes  Debbora Dus, CPP notified  Marijean Niemann, [place] Assistant [phone]

## 2021-07-22 NOTE — Telephone Encounter (Signed)
Completed refill form and faxed to [employer] for Lantus 38 units daily. No further action needed today.

## 2021-07-25 NOTE — Progress Notes (Signed)
07/25/2021 - Called [employer] for patients refill request for Lantus. They have not received the fax. Please re-fax to [place] fax) [phone]. ON the fax cover sheet please include patient ID: [id number].  Time Spent: [place], CPP notified  Marijean Niemann, [place] Pharmacy Assistant [phone]

## 2021-07-29 ENCOUNTER — Other Ambulatory Visit: Payer: Self-pay | Admitting: Family Medicine

## 2021-07-29 ENCOUNTER — Telehealth: Payer: Self-pay | Admitting: Family Medicine

## 2021-07-29 NOTE — Telephone Encounter (Signed)
Pt wife called requesting lantus prescription. Pt wife said pt only has one left and they are in the process of filling out the assistance forms that was mailed to them. Pt is concerned about running out of medication and pt did not get medication from pharmacy but from the [facility] office  Pt wife [phone]

## 2021-07-29 NOTE — Telephone Encounter (Signed)
Apparently, they still did not receive my fax. I am faxing a blank form to Velora Heckler [phone]) (Attention: Sherrilee Gilles) this morning. Janett Billow, could you complete the refill form (its 1 page) and fax back to [employer]? Thanks!  Debbora Dus, PharmD Clinical Pharmacist [place] [facility] at [facility] [phone]

## 2021-07-29 NOTE — Telephone Encounter (Signed)
Spoke with patient's wife. Updated her that refill request has been sent multiple times but [employer] has not received it. Will try to call today and request verbally. Pt has refilled through [facility] for now since he only has 7-10 day supply on hand.   Debbora Dus, PharmD Clinical Pharmacist [place] [facility] at [facility] [phone]

## 2021-07-29 NOTE — Telephone Encounter (Signed)
Form has been faxed to [facility]

## 2021-07-30 MED ORDER — LANTUS SOLOSTAR 100 UNIT/ML ~~LOC~~ SOPN: 38.0000 [IU] | PEN_INJECTOR | Freq: Every day | SUBCUTANEOUS | Status: DC

## 2021-07-31 NOTE — Progress Notes (Signed)
07/31/2021 - Spoke with [employer]. The refill for Lantus was processed today. Patient will receive the medication within 7 business days. Called patient to inform him; spoke with his wife.   Debbora Dus, CPP notified  Marijean Niemann, [place] Clinical Pharmacy Assistant [phone]  Time Spent:  5 Minutes

## 2021-08-01 NOTE — Telephone Encounter (Signed)
Lantus shipment has been received. I called and left message for patient that it is here in [place] office.

## 2021-08-06 ENCOUNTER — Telehealth: Payer: Self-pay | Admitting: Family Medicine

## 2021-08-06 NOTE — Telephone Encounter (Signed)
Has this been picked up? Attempted to reach pt but no answer.

## 2021-08-06 NOTE — Telephone Encounter (Signed)
I will work on the hardcopy.  Thanks. 

## 2021-08-06 NOTE — Telephone Encounter (Signed)
He has not picked up rx yet.

## 2021-08-06 NOTE — Telephone Encounter (Signed)
Forms placed in Dr. Carole Civil box to sign

## 2021-08-06 NOTE — Telephone Encounter (Signed)
Pt dropped off paperwork  Type of forms received:not sure  Routed [id number]  Paperwork received by :  terrill  Individual made aware of 3-5 business day turn around (Y/N):y  Form completed and patient made aware of charges(Y/N):y   Faxed to :   Form location:  dr Damita Dunnings box

## 2021-08-07 NOTE — Telephone Encounter (Signed)
Received completed form. Called patient would like faxed to number of form. Declined having copy mailed to home for his records. Faxed no further action at this time.

## 2021-08-14 NOTE — Progress Notes (Signed)
Patient assistance forms for Lantus renewal mailed to patient with instructions.  Debbora Dus, CPP notified  [name], [place] Assistant [phone]  Total time spent for month CPA: 10 min

## 2021-08-15 NOTE — Telephone Encounter (Signed)
Amy, can you check with patient's wife to see if they have picked up Lantus from office yet?

## 2021-08-19 ENCOUNTER — Telehealth: Payer: Medicare HMO

## 2021-08-20 NOTE — Progress Notes (Signed)
Tried calling patient to verify he has picked up his medication. No answer; mailbox is full. Sent a text message as well.   Debbora Dus, CPP notified  Marijean Niemann, [place] Clinical Pharmacy Assistant [phone]  Time Spent:  5 Minutes

## 2021-08-20 NOTE — Progress Notes (Signed)
Patient's wife sent me a message via text stating they did pick up patient's medication.   Debbora Dus, CPP notified  Marijean Niemann, [place] Clinical Pharmacy Assistant [phone]

## 2021-08-21 ENCOUNTER — Other Ambulatory Visit: Payer: Self-pay

## 2021-08-21 NOTE — Patient Instructions (Signed)
Patient Goals/Self-Care Activities: Diabetes Take all medications as prescribed Attend all scheduled provider appointments keep appointment with eye doctor check blood sugar at prescribed times: once daily       Limit carbs such as rice, potatoes, breads, and pastas.        Limit sweets and sugary drinks.         Control portions.

## 2021-08-21 NOTE — Patient Outreach (Signed)
[facility]) Care Management  [name]  08/21/2021   Bradley Davila 11/29/1960 [id number]  Subjective: Telephone call to patient for disease management follow up. Patient reports he is doing good.  A1c noted to be down to 7.5  Congratulated on success and encouraged continued work on diabetes.  Patient has eye appointment Sep 24, 2021.  No concerns.    Objective:   Encounter Medications:  Outpatient Encounter Medications as of 08/21/2021  Medication Sig   acetaminophen (TYLENOL) 325 MG tablet You can take 2 tablets every 6 hours as needed for pain. Use this first then the prescribed pain medication.   DO NOT TAKE MORE THAN 4000 MG OF TYLENOL PER DAY.  IT CAN HARM YOUR LIVER.   atorvastatin (LIPITOR) 20 MG tablet TAKE 1 TABLET EVERY DAY   clotrimazole-betamethasone (LOTRISONE) cream Apply 1 application topically daily.   EPINEPHrine 0.3 mg/0.3 mL IJ SOAJ injection Inject 0.3 mg into the muscle as needed.   insulin glargine (LANTUS SOLOSTAR) 100 UNIT/ML Solostar Pen Inject 38 Units into the skin daily.   latanoprost (XALATAN) 0.005 % ophthalmic solution    levETIRAcetam (KEPPRA) 1000 MG tablet TAKE 1 TABLET BY MOUTH TWICE A DAY   meloxicam (MOBIC) 15 MG tablet TAKE 1 TABLET EVERY DAY WITH FOOD AS NEEDED FOR PAIN   metFORMIN (GLUCOPHAGE-XR) 500 MG 24 hr tablet Take 500 mg by mouth in the morning and at bedtime.   oxcarbazepine (TRILEPTAL) 600 MG tablet TAKE 1 TABLET TWICE DAILY   ramipril (ALTACE) 1.25 MG capsule TAKE 1 CAPSULE BY MOUTH EVERY DAY   triamcinolone cream (KENALOG) 0.5 % Apply 1 application topically 2 (two) times daily as needed (for itchy spots on skin).   TRUE METRIX BLOOD GLUCOSE TEST test strip USE AS DIRECTED  TO TEST BLOOD SUGAR ONE TIME DAILY  OR AS NEEDED   TRUEPLUS LANCETS 33G MISC Use as instructed to test blood sugar once daily or as needed.  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   No facility-administered encounter medications on  file as of 08/21/2021.    Functional Status:  In your present state of health, do you have any difficulty performing the following activities: 05/12/2021  Hearing? Y  Vision? N  Difficulty concentrating or making decisions? N  Walking or climbing stairs? N  Dressing or bathing? N  Doing errands, shopping? N  Preparing Food and eating ? N  Using the Toilet? N  In the past six months, have you accidently leaked urine? Y  Do you have problems with loss of bowel control? N  Managing your Medications? N  Managing your Finances? N  Housekeeping or managing your Housekeeping? N  Some recent data might be hidden    Fall/Depression Screening: Fall Risk  08/21/2021 05/12/2021 11/23/2020  Falls in the past year? 0 1 0  Number falls in past yr: - 1 -  Injury with Fall? - 1 -  Risk for fall due to : - History of fall(s) -  Follow up - Falls evaluation completed -   PHQ 2/9 Scores 08/21/2021 05/12/2021 11/23/2020 05/28/2020 02/27/2020 11/09/2019 07/01/2019  PHQ - 2 Score 0 4 0 0 1 2 1   PHQ- 9 Score - 4 - - - 10 -    Assessment:   Care Plan Care Plan : Diabetes Type 2 (Adult)  Updates made by Jon Billings, RN since 08/21/2021 12:00 AM  Completed 08/21/2021   Problem: Disease Progression (Diabetes, Type 2) Resolved 08/21/2021  Long-Range Goal: Disease Progression Prevented or Minimized as evidenced by A1c less than 8. Completed 08/21/2021  Start Date: 08/24/2020  Expected End Date: 12/13/2021  Recent Progress: Not on track  Priority: High  Note:   Evidence-based guidance:  Encourage lifestyle changes, such as increased intake of plant-based foods, stress reduction, consistent physical activity, and smoking cessation to prevent  long-term complications and chronic diseases.  Individualize activity and exercise recommendations while considering  potential limitations, such as neuropathy, retinopathy, or the ability to prevent  hyperglycemia or hypoglycemia. Notes: 58/0/99 Resolving  duplicate goal    Task: Monitor and Manage Follow-up for Comorbidities Completed 08/21/2021  Due Date: 09/14/2021  Priority: Routine  Responsible User: Jon Billings, RN  Note:   Care Management Activities:    - activity based on tolerance and functional limitations encouraged - healthy lifestyle promoted - response to pharmacologic therapy monitored    Notes: 05/22/21 Patient reports sugars are much better with report of 123 today.  Encouraged to continue progress. Diabetes Management Discussed: Medication adherence Reviewed importance of limiting carbs such as rice, potatoes, breads, and pastas. Also discussed limiting sweets and sugary drinks.  Discussed importance of portion control.  Also discussed importance of annual exams, foot exams, and eye exams.      Care Plan : RN Care Manager Plan of Care  Updates made by Jon Billings, RN since 08/21/2021 12:00 AM     Problem: Chronic Disease Management for diabetes   Priority: High     Long-Range Goal: Development of plan of care for management of diabetes   Start Date: 08/21/2021  Expected End Date: 09/14/2022  Priority: High  Note:   Current Barriers:  Chronic Disease Management support and education needs related to DMII   RNCM Clinical Goal(s):  Patient will verbalize basic understanding of  DMII disease process and self health management plan as evidenced by A1c less than 8.0 continue to work with RN Care Manager to address care management and care coordination needs related to  DMII as evidenced by adherence to CM Team Scheduled appointments through collaboration with RN Care manager, provider, and care team.   Interventions: Education and support related to diabetes management Inter-disciplinary care team collaboration (see longitudinal plan of care) Evaluation of current treatment plan related to  self management and patient's adherence to plan as established by provider   Diabetes Interventions:  (Status:  New goal.)  Long Term Goal   08/21/21 Congratulated patient on lowering of A1c.   Assessed patient's understanding of A1c goal: <8% Provided education to patient about basic DM disease process Discussed plans with patient for ongoing care management follow up and provided patient with direct contact information for care management team Reviewed scheduled/upcoming provider appointments including: Eye Doctor 09/24/21 Lab Results  Component Value Date   HGBA1C 7.5 (A) 05/30/2021   Patient Goals/Self-Care Activities: Diabetes Take all medications as prescribed Attend all scheduled provider appointments keep appointment with eye doctor check blood sugar at prescribed times: once daily       Limit carbs such as rice, potatoes, breads, and pastas.        Limit sweets and sugary drinks.         Control portions.  Follow Up Plan:  Telephone follow up appointment with care management team member scheduled for:  March The patient has been provided with contact information for the care management team and has been advised to call with any health related questions or concerns.  Goals Addressed               This Visit's Progress     COMPLETED: [facility]-Monitor and Manage My Blood Sugar-Diabetes Type 2 (pt-stated)   On track     Timeframe:  Long-Range Goal Priority:  High Start Date:    12/10//21                         Expected End Date: 12/13/21  Follow up date: 09/14/21        - check blood sugar at prescribed times - take the blood sugar log to all doctor visits    Why is this important?   Checking your blood sugar at home helps to keep it from getting very high or very low.  Writing the results in a diary or log helps the doctor know how to care for you.  Your blood sugar log should have the time, date and the results.  Also, write down the amount of insulin or other medicine that you take.  Other information, like what you ate, exercise done and how you were feeling, will also be helpful.      Notes: Patient reports blood sugars higher right now with today's reading 212.  Discussed importance of diet. Follow up with physician. 02/22/21 Patient reports blood sugars in the 200's.  Last A1c was 7.3 Reviewed importance of limiting carbs such as rice, potatoes, breads, and pastas. Also discussed limiting sweets and sugary drinks.  Discussed importance of portion control.  Also discussed importance of annual exams such as mammogram and eye exam.   05/22/21 Patient reports sugars are much better. Encouraged to continue progress.  Reviewed diabetes management. 61/9/01 Resolving duplicate goal         Plan:  Follow-up: Patient agrees to Care Plan and Follow-up. Follow-up in 3 month(s)  [name], RN, MSN [place] Management Care Management Coordinator Direct Line [phone] Cell [phone] Toll Free: [phone]  Fax: [phone]

## 2021-08-27 ENCOUNTER — Other Ambulatory Visit: Payer: Self-pay | Admitting: Family Medicine

## 2021-09-23 ENCOUNTER — Telehealth: Payer: Self-pay

## 2021-09-23 DIAGNOSIS — H2513 Age-related nuclear cataract, bilateral: Secondary | ICD-10-CM | POA: Diagnosis not present

## 2021-09-23 DIAGNOSIS — H524 Presbyopia: Secondary | ICD-10-CM | POA: Diagnosis not present

## 2021-09-23 NOTE — Progress Notes (Signed)
° ° °  Chronic Care Management Pharmacy Assistant   Name: Bradley Davila  MRN: [id number] DOB: 1960/10/16  Reason for Encounter: CCM (Appointment Reminder)  Medications: Outpatient Encounter Medications as of 09/23/2021  Medication Sig   acetaminophen (TYLENOL) 325 MG tablet You can take 2 tablets every 6 hours as needed for pain. Use this first then the prescribed pain medication.   DO NOT TAKE MORE THAN 4000 MG OF TYLENOL PER DAY.  IT CAN HARM YOUR LIVER.   atorvastatin (LIPITOR) 20 MG tablet TAKE 1 TABLET EVERY DAY   clotrimazole-betamethasone (LOTRISONE) cream Apply 1 application topically daily.   EPINEPHrine 0.3 mg/0.3 mL IJ SOAJ injection Inject 0.3 mg into the muscle as needed.   insulin glargine (LANTUS SOLOSTAR) 100 UNIT/ML Solostar Pen Inject 38 Units into the skin daily.   latanoprost (XALATAN) 0.005 % ophthalmic solution    levETIRAcetam (KEPPRA) 1000 MG tablet TAKE 1 TABLET BY MOUTH TWICE A DAY   meloxicam (MOBIC) 15 MG tablet TAKE 1 TABLET EVERY DAY WITH FOOD AS NEEDED FOR PAIN   metFORMIN (GLUCOPHAGE-XR) 500 MG 24 hr tablet Take 500 mg by mouth in the morning and at bedtime.   oxcarbazepine (TRILEPTAL) 600 MG tablet TAKE 1 TABLET TWICE DAILY   ramipril (ALTACE) 1.25 MG capsule TAKE 1 CAPSULE BY MOUTH EVERY DAY   triamcinolone cream (KENALOG) 0.5 % Apply 1 application topically 2 (two) times daily as needed (for itchy spots on skin).   TRUE METRIX BLOOD GLUCOSE TEST test strip USE AS DIRECTED  TO TEST BLOOD SUGAR ONE TIME DAILY  OR AS NEEDED   TRUEPLUS LANCETS 33G MISC Use as instructed to test blood sugar once daily or as needed.  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   No facility-administered encounter medications on file as of 09/23/2021.   [name] was contacted to remind of upcoming telephone visit with Debbora Dus on 09/30/2021 at 10:00. Patient was reminded to have all medications, supplements and any blood glucose and blood pressure readings available for  review at appointment. If unable to reach, a voicemail was left for patient.   Star Rating Drugs: Medication:   Last Fill: Day Supply Ramipril 1.25 mg  06/12/2021 90          Lantus    07/30/2021 90                     Atorvastatin 20 mg  06/19/2021 [place], CPP notified  Marijean Niemann, [place] Pharmacy Assistant [phone]

## 2021-09-30 ENCOUNTER — Other Ambulatory Visit: Payer: Self-pay

## 2021-09-30 ENCOUNTER — Telehealth: Payer: Self-pay

## 2021-09-30 ENCOUNTER — Ambulatory Visit (INDEPENDENT_AMBULATORY_CARE_PROVIDER_SITE_OTHER): Payer: Medicare HMO

## 2021-09-30 DIAGNOSIS — E785 Hyperlipidemia, unspecified: Secondary | ICD-10-CM

## 2021-09-30 DIAGNOSIS — E1169 Type 2 diabetes mellitus with other specified complication: Secondary | ICD-10-CM

## 2021-09-30 NOTE — Telephone Encounter (Signed)
Please check with manufacturer on status of Lantus PAP renewal. He was approved through end of 2022 and submitted renewal forms to the office for 2023. He has 9 pens on hand. Patient's wife Clarene Critchley [phone] would like an update.  Debbora Dus, PharmD Clinical Pharmacist Practitioner [facility] [phone]

## 2021-09-30 NOTE — Patient Instructions (Signed)
Dear Bradley Davila,  Below is a summary of the goals we discussed during our follow up appointment on September 30, 2021. Please contact me anytime with questions or concerns.   Visit Information   Patient Care Plan: CCM Pharmacy Care Plan     Problem Identified: CHL AMB "PATIENT-SPECIFIC PROBLEM"      Long-Range Goal: Disease Management   Start Date: 04/03/2021  This Visit's Progress: On track  Priority: High  Note:    Current Barriers:  Lantus cost   Pharmacist Clinical Goal(s):  Patient will contact provider office for questions/concerns as evidenced notation of same in electronic health record through collaboration with PharmD and provider.   Interventions: 1:1 collaboration with Tonia Ghent, MD regarding development and update of comprehensive plan of care as evidenced by provider attestation and co-signature Inter-disciplinary care team collaboration (see longitudinal plan of care) Comprehensive medication review performed; medication list updated in electronic medical record  Hyperlipidemia: (LDL goal < 70) -Controlled - LDL 69 -Current treatment: Atorvastatin 20 mg - 1 tablet daily Appropriate, Effective, Safe, Accessible -Medications previously tried: none reported  -Reviewed adherence, denies missed doses. Refills timely.  -Recommended to continue current medication  Diabetes (A1c goal <7.5%) -Controlled, His A1c improved from 10.1% to 7.5% 05/2021. Pt reports fasting BG 122 today. Reports BG are good as long as he eats well. He limits food after 6 PM and avoids sodas. Continues Lantus 38 units and metformin 500 mg BID. -Current medications: Lantus - Inject 38 units daily Appropriate, Effective, Safe, Accessible Metformin 500 mg ER - 1 tablet twice daily Appropriate, Effective, Safe, Accessible -Medications previously tried: GI upset with more than 1000 mg metformin/day, Trulicity (never started due to A1c improvement prior to completion of PAP) -Current home  glucose readings - checks every morning Fasting glucose: 122 (09/20/21) Bedtime glucose: usually < 200 -Denies hypoglycemic/hyperglycemic symptoms; No BG < 70.  -Current meal patterns: avoids eating after 6 PM -Current exercise: minimal -ACE-I: Yes- ramipril 1.25 mg -Statin: Yes- atorvastatin 20 mg -Foot exam and eye exam are up to date. He reports some foot pain on occasion. He takes meloxicam rarely. -Recommended to continue current medication; CCM team will check on Lantus PAP for 2023. If diabetes control worsens, patient is open to starting Trulicity in future.   Patient Goals/Self-Care Activities Patient will:  - take medications as prescribed - check glucose daily, document, and provide at future appointments - engage in dietary modifications by avoiding soda and limiting portion sizes  Follow Up Plan: Telephone follow up appointment with care management team member scheduled for:  - CPP visit 6 months - No health concierge calls due to patient followed by [facility] nursing for diabetes    Patient verbalizes understanding of instructions and care plan provided today and agrees to view in [place]. Active MyChart status confirmed with patient.    Debbora Dus, PharmD Clinical Pharmacist Practitioner [facility] [place] [phone]

## 2021-09-30 NOTE — Progress Notes (Signed)
Chronic Care Management Pharmacy Note  09/30/2021 Name:  Bradley Davila MRN:  [id number] DOB:  Mar 11, 1961  Summary: CCM follow up visit. Discussed diabetes and hyperlipidemia. His A1c improved from 10.1% to 7.5% 05/2021. Pt reports fasting BG 122 today. Reports BG are good as long as he eats well. He limits food after 6 PM and avoids sodas. Continues Lantus 38 units and metformin 500 mg BID. His last LDL < 70, on atorvastatin 20 mg daily. Reviewed adherence. Barriers include: Lantus cost. He needs assistance renewed for 2023. Reports he turned in forms at the end of 2022 to [place] office. Will have health concierge check on status of application.  Recommendations/Changes made from today's visit: None  Plan: CCM follow up visit 6 months [place] will check on Lantus assistance   Subjective: Bradley Davila is an 61 y.o. year old male who is a primary patient of Bradley Davila, Bradley Rising, MD.  The CCM team was consulted for assistance with disease management and care coordination needs.    Engaged with patient by telephone for follow up visit in response to provider referral for pharmacy case management and/or care coordination services.   Consent to Services:  The patient was given information about Chronic Care Management services, agreed to services, and gave verbal consent prior to initiation of services.  Please see initial visit note for detailed documentation.   Patient Care Team: Tonia Ghent, MD as PCP - General (Family Medicine) Jon Billings, RN as [facility] Debbora Dus, [facility] as Pharmacist (Pharmacist)  Recent office visits:  05/30/2021 - Elsie Stain, MD - Pt presented for diabetes follow up and rash. A1c improved.  Would continue as is with insulin and metformin.  Recheck periodically.  I thanked him for his effort.  He has been working on his diet and cutting out soda. Triamcinolone for the bug bites. Combination cream for the  rash. Follow up 4 months.   Recent consult visits:  None in previous 6 months    Hospital visits:  None in previous 6 months   Objective:  Lab Results  Component Value Date   CREATININE 1.13 01/02/2021   BUN 12 01/02/2021   GFR 78.03 10/31/2019   GFRNONAA 87 01/03/2020   GFRAA 100 01/03/2020   NA 139 01/02/2021   K 4.7 01/02/2021   CALCIUM 9.8 01/02/2021   CO2 24 01/02/2021   GLUCOSE 136 (H) 01/02/2021    Lab Results  Component Value Date/Time   HGBA1C 7.5 (A) 05/30/2021 11:03 AM   HGBA1C 10.1 (A) 02/26/2021 11:30 AM   HGBA1C 7.9 (H) 10/31/2019 10:51 AM   HGBA1C 8.5 (H) 10/26/2018 08:38 AM   GFR 78.03 10/31/2019 10:51 AM   GFR 79.23 10/26/2018 08:38 AM   MICROALBUR 24.2 (H) 10/31/2019 10:51 AM   MICROALBUR 57.8 (H) 10/26/2018 08:38 AM    Last diabetic Eye exam:  Lab Results  Component Value Date/Time   HMDIABEYEEXA No Retinopathy 04/24/2021 12:00 AM    Last diabetic Foot exam: 02/26/2021   Lab Results  Component Value Date   CHOL 143 10/31/2019   HDL 43.30 10/31/2019   LDLCALC 69 10/31/2019   LDLDIRECT 171.0 10/26/2018   TRIG 151.0 (H) 10/31/2019   CHOLHDL 3 10/31/2019    Hepatic Function Latest Ref Rng & Units 01/02/2021 01/03/2020 10/31/2019  Total Protein 6.0 - 8.5 g/dL 7.7 7.2 7.3  Albumin 3.8 - 4.8 g/dL 4.4 4.5 4.4  AST 0 - 40 IU/L 19 18 17  ALT 0 - 44 IU/L 24 23 25  °Alk Phosphatase 44 - 121 IU/L 101 89 84  °Total Bilirubin 0.0 - 1.2 mg/dL 0.3 <0.2 0.4  °Bilirubin, Direct 0.0 - 0.3 mg/dL - - -  ° ° °Lab Results  °Component Value Date/Time  ° TSH 1.188 10/21/2006 09:14 PM  ° ° °CBC Latest Ref Rng & Units 01/02/2021 01/03/2020 08/03/2017  °WBC 3.4 - 10.8 x10E3/uL 9.1 9.9 10.6(H)  °Hemoglobin 13.0 - 17.7 g/dL 15.2 15.0 15.3  °Hematocrit 37.5 - 51.0 % 44.8 42.6 44.8  °Platelets 150 - 450 x10E3/uL 346 373 334  ° ° °No results found for: VD25OH ° °Clinical ASCVD: Yes  °The ASCVD Risk score (Arnett DK, et al., 2019) failed to calculate for the following reasons: °   The patient has a prior MI or stroke diagnosis   ° °Depression screen PHQ 2/9 08/21/2021 05/12/2021 11/23/2020  °Decreased Interest 0 2 0  °Down, Depressed, Hopeless 0 2 0  °PHQ - 2 Score 0 4 0  °Altered sleeping - 0 -  °Tired, decreased energy - 0 -  °Change in appetite - 0 -  °Feeling bad or failure about yourself  - 0 -  °Trouble concentrating - 0 -  °Moving slowly or fidgety/restless - 0 -  °Suicidal thoughts - 0 -  °PHQ-9 Score - 4 -  °Difficult doing work/chores - Not difficult at all -  °  °Social History  ° °Tobacco Use  °Smoking Status Some Days  ° Types: Cigarettes  °Smokeless Tobacco Never  ° °BP Readings from Last 3 Encounters:  °05/30/21 126/82  °02/26/21 120/82  °01/22/21 106/70  ° °Pulse Readings from Last 3 Encounters:  °05/30/21 87  °02/26/21 87  °01/22/21 71  ° °Wt Readings from Last 3 Encounters:  °05/30/21 217 lb (98.4 kg)  °02/26/21 220 lb (99.8 kg)  °01/22/21 226 lb (102.5 kg)  ° °BMI Readings from Last 3 Encounters:  °05/30/21 35.02 kg/m²  °02/26/21 35.51 kg/m²  °01/22/21 36.48 kg/m²  ° ° °Assessment/Interventions: Review of patient past medical history, allergies, medications, health status, including review of consultants reports, laboratory and other test data, was performed as part of comprehensive evaluation and provision of chronic care management services.  ° °SDOH:  (Social Determinants of Health) assessments and interventions performed: Yes °SDOH Interventions   ° °Flowsheet Row Most Recent Value  °SDOH Interventions   °Financial Strain Interventions Other (Comment)  [Renew Lantus PAP 2023]  ° °  ° °SDOH Screenings  ° °Alcohol Screen: Low Risk   ° Last Alcohol Screening Score (AUDIT): 0  °Depression (PHQ2-9): Low Risk   ° PHQ-2 Score: 0  °Financial Resource Strain: High Risk  ° Difficulty of Paying Living Expenses: Hard  °Food Insecurity: No Food Insecurity  ° Worried About Running Out of Food in the Last Year: Never true  ° Ran Out of Food in the Last Year: Never true  °Housing: Low  Risk   ° Last Housing Risk Score: 0  °Physical Activity: Insufficiently Active  ° Days of Exercise per Week: 3 days  ° Minutes of Exercise per Session: 20 min  °Social Connections: Socially Isolated  ° Frequency of Communication with Friends and Family: Once a week  ° Frequency of Social Gatherings with Friends and Family: Once a week  ° Attends Religious Services: Never  ° Active Member of Clubs or Organizations: No  ° Attends Club or Organization Meetings: Never  ° Marital Status: Married  °Stress: No Stress Concern Present  °   Feeling of Stress : Only a little  °Tobacco Use: High Risk  ° Smoking Tobacco Use: Some Days  ° Smokeless Tobacco Use: Never  ° Passive Exposure: Not on file  °Transportation Needs: No Transportation Needs  ° Lack of Transportation (Medical): No  ° Lack of Transportation (Non-Medical): No  ° ° °CCM Care Plan ° °Allergies  °Allergen Reactions  ° Benadryl [Diphenhydramine Hcl]   °  Caused seizures.   ° Yellow Jacket Venom [Honey Bee Venom]   °  Facial swelling after sting  ° Carbamazepine   °  Inc in seizures  ° Lamotrigine   °  Inc in seizures  ° Metformin And Related   °  Diarrhea with higher doses  ° Phenobarbital   °  rash  ° ° °Medications Reviewed Today   ° ° Reviewed by Adams, Michelle, RPH (Pharmacist) on 09/30/21 at 1026  Med List Status: <None>  ° °Medication Order Taking? Sig Documenting Provider Last Dose Status Informant  °acetaminophen (TYLENOL) 325 MG tablet [id number] Yes You can take 2 tablets every 6 hours as needed for pain. Use this first then the prescribed pain medication.   °DO NOT TAKE MORE THAN 4000 MG OF TYLENOL PER DAY.  IT CAN HARM YOUR LIVER. Jennings, Willard, PA-C Taking Active   °atorvastatin (LIPITOR) 20 MG tablet [id number] Yes TAKE 1 TABLET EVERY DAY Duncan, Graham S, MD Taking Active   °clotrimazole-betamethasone (LOTRISONE) cream [id number]  Apply 1 application topically daily. Duncan, Graham S, MD  Active   °EPINEPHrine 0.3 mg/0.3 mL IJ SOAJ injection [id number]   Inject 0.3 mg into the muscle as needed. Copland, Spencer, MD  Active Self  °         °Med Note (DUNCAN, GRAHAM S   Thu Jul 07, 2019  4:08 PM)    °insulin glargine (LANTUS SOLOSTAR) 100 UNIT/ML Solostar Pen [id number] Yes Inject 38 Units into the skin daily. Duncan, Graham S, MD Taking Active   °latanoprost (XALATAN) 0.005 % ophthalmic solution [id number] Yes  [provider] Taking Active   °levETIRAcetam (KEPPRA) 1000 MG tablet [id number] Yes TAKE 1 TABLET BY MOUTH TWICE A DAY Millikan, Megan, NP Taking Active   °meloxicam (MOBIC) 15 MG tablet [id number] Yes TAKE 1 TABLET EVERY DAY WITH FOOD AS NEEDED FOR PAIN Duncan, Graham S, MD Taking Active   °metFORMIN (GLUCOPHAGE-XR) 500 MG 24 hr tablet [id number] Yes Take 500 mg by mouth in the morning and at bedtime. [provider] Taking Active Self  °oxcarbazepine (TRILEPTAL) 600 MG tablet [id number] Yes TAKE 1 TABLET TWICE DAILY Millikan, Megan, NP Taking Active   °ramipril (ALTACE) 1.25 MG capsule [id number] Yes TAKE 1 CAPSULE BY MOUTH EVERY DAY Duncan, Graham S, MD Taking Active   °triamcinolone cream (KENALOG) 0.5 % [id number] Yes Apply 1 application topically 2 (two) times daily as needed (for itchy spots on skin). Duncan, Graham S, MD Taking Active   °TRUE METRIX BLOOD GLUCOSE TEST test strip [id number] Yes USE AS DIRECTED  TO TEST BLOOD SUGAR ONE TIME DAILY  OR AS NEEDED Duncan, Graham S, MD Taking Active   °TRUEPLUS LANCETS 33G MISC [id number] Yes Use as instructed to test blood sugar once daily or as needed.  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 Duncan, Graham S, MD Taking Active   ° °  °  ° °  ° ° °Patient Active Problem List  ° Diagnosis Date Noted  ° Other social stressor 02/27/2021  ° Hiccups 01/23/2021  ° Colon cancer screening 04/11/2020  °   Medicare annual wellness visit, subsequent 11/03/2019   HLD (hyperlipidemia) 11/03/2019   Erectile dysfunction 07/10/2019   Right foot pain 05/01/2019   Advance care planning 10/28/2018   Mood  change 10/28/2018   Shoulder pain 10/28/2018   Hyperplasia, fatty tissue 10/28/2018   Health care maintenance 10/26/2018   Partial symptomatic epilepsy with complex partial seizures, not intractable, with status epilepticus ([place]) 09/09/2018   Changes in vision 09/09/2018   Urinary symptom or sign 03/49/1791   Umbilical hernia without obstruction and without gangrene 04/12/2017   Itching 04/12/2017   Cerebral palsy, hemiplegic ([place]) 03/10/2017   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[place]) 03/10/2017   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   LLQ pain 01/11/2014   Diabetes mellitus type 2 with complications, uncontrolled 07/17/2013   Cerebral palsy ([place]) 05/11/2011   Hearing loss 04/17/2011   Rash 04/17/2011   Skin lesion 04/17/2011   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[place]) 03/02/2007   STROKE 03/02/2007    Immunization History  Administered Date(s) Administered   PFIZER(Purple Top)SARS-COV-2 Vaccination 12/03/2019, 01/03/2020, 11/17/2020    Conditions to be addressed/monitored:  Hyperlipidemia and Diabetes  Care Plan : [place]  Updates made by Debbora Dus, [place] since 09/30/2021 12:00 AM     Problem: CHL AMB "PATIENT-SPECIFIC PROBLEM"      Long-Range Goal: Disease Management   Start Date: 04/03/2021  This Visit's Progress: On track  Priority: High  Note:    Current Barriers:  Lantus cost   Pharmacist Clinical Goal(s):  Patient will contact provider office for questions/concerns as evidenced notation of same in electronic health record through collaboration with PharmD and provider.   Interventions: 1:1 collaboration with Tonia Ghent, MD regarding development and update of comprehensive plan of care as evidenced by provider attestation and co-signature Inter-disciplinary care team collaboration (see longitudinal plan of care) Comprehensive medication review performed; medication list updated in electronic medical record  Hyperlipidemia: (LDL goal <  70) -Controlled - LDL 69 -Current treatment: Atorvastatin 20 mg - 1 tablet daily Appropriate, Effective, Safe, Accessible -Medications previously tried: none reported  -Reviewed adherence, denies missed doses. Refills timely.  -Recommended to continue current medication  Diabetes (A1c goal <7.5%) -Controlled, His A1c improved from 10.1% to 7.5% 05/2021. Pt reports fasting BG 122 today. Reports BG are good as long as he eats well. He limits food after 6 PM and avoids sodas. Continues Lantus 38 units and metformin 500 mg BID. -Current medications: Lantus - Inject 38 units daily Appropriate, Effective, Safe, Accessible Metformin 500 mg ER - 1 tablet twice daily Appropriate, Effective, Safe, Accessible -Medications previously tried: GI upset with more than 1000 mg metformin/day, Trulicity (never started due to A1c improvement prior to completion of PAP) -Current home glucose readings - checks every morning Fasting glucose: 122 (09/20/21) Bedtime glucose: usually < 200 -Denies hypoglycemic/hyperglycemic symptoms; No BG < 70.  -Current meal patterns: avoids eating after 6 PM -Current exercise: minimal -ACE-I: Yes- ramipril 1.25 mg -Statin: Yes- atorvastatin 20 mg -Foot exam and eye exam are up to date. He reports some foot pain on occasion. He takes meloxicam rarely. -Recommended to continue current medication; CCM team will check on Lantus PAP for 2023. If diabetes control worsens, patient is open to starting Trulicity in future.   Patient Goals/Self-Care Activities Patient will:  - take medications as prescribed - check glucose daily, document, and provide at future appointments - engage in dietary modifications by avoiding soda and limiting portion sizes  Follow Up Plan: Telephone follow up appointment with care  management team member scheduled for:  - CPP visit 6 months - No health concierge calls due to patient followed by [facility] nursing for diabetes     Medication Assistance: Lantus  PAP completed 08/2021, will check on status of approval/denial.  Compliance/Adherence/Medication fill history: Care Gaps: Colonoscopy - discussed with patient, he declines. He reports he cannot afford it. States insurance does not cover.   Star-Rating Drugs: Medication:                            Last Fill:         Day Supply Ramipril 1.25 mg                     09/20/2021      90          Lantus                                     07/30/2021      90                                                             Atorvastatin 20 mg                  09/20/2021      90            Metformin 500 mg ER  No fill history, He has plenty of metformin on hand due to prior dose reductions. Reminded patient to not let the medication expire/run out.  Patient's preferred pharmacy is: [facility] - [place], [place] [address] Phone: [phone] Fax: [phone]  Using Mail Order for all medications except for acute medications and [facility]ipril and [facility] (prefers the [facility] of the tablet from [facility] over [facility]) Uses pill box? Yes -  one for morning and bedtime  Pt endorses 100% compliance  Care Plan and Follow Up Patient Decision:  Patient agrees to Care Plan and Follow-up.  Debbora Dus, PharmD Clinical Pharmacist [facility] at [facility] [phone]

## 2021-09-30 NOTE — Progress Notes (Signed)
Called [employer] to check on the status of patient assistance forms. [employer] is closed due to the holiday. I will try back tomorrow. I called and left a message to update patient's wife.   Debbora Dus, CPP notified  Marijean Niemann, [place] Clinical Pharmacy Assistant [phone]  Time Spent: 5 Minutes

## 2021-10-04 NOTE — Progress Notes (Signed)
Called [employer] to check on the status of patient assistance forms. [employer] states that they do not have the provider portion of the forms. They also stated that patient's re-enrollment form was sent to them in November. The forms must be time stamped 2023 to be approved for the 2023 enrollment year. Please refax all forms to [phone]. Please add patients full name, date of birth and Patient ID #: [id number] to the fax cover sheet.   Debbora Dus, CPP notified  Marijean Niemann, [place] Clinical Pharmacy Assistant [phone]  Time Spent: 5 Minutes

## 2021-10-09 NOTE — Telephone Encounter (Signed)
Unfortunately, we may have to complete forms again. I will ask Bradley Davila if she kept a copy. See telephone note 08/06/21.

## 2021-10-10 NOTE — Progress Notes (Signed)
I called patient to advised him that new patient assistance forms would need to be completed for 2023. Patient is going to have his wife call me when she gets back home. I explained the situation to the patient. He understood and wants his wife to call me back. Patient assistance forms have been completed and uploaded.   Debbora Dus, CPP notified  Marijean Niemann, [place] Clinical Pharmacy Assistant [phone]  Time Spent: 20 Minutes

## 2021-10-10 NOTE — Telephone Encounter (Signed)
Yes, I do not see it under media. We can start a new one. Thank you!

## 2021-10-10 NOTE — Telephone Encounter (Signed)
Amy, can you let patient know we have to update forms since they must be completed in 2023. We can email or they can come to office to sign Research officer, trade union or [employer]).  Debbora Dus, PharmD Clinical Pharmacist Practitioner [facility] at [facility] [phone]

## 2021-10-10 NOTE — Telephone Encounter (Signed)
Sharyn Lull,  Did you look under the media tab to see if the forms were scanned into chart?

## 2021-10-11 NOTE — Progress Notes (Signed)
I left a message for his wife. When I spoke to patient yesterday I stated he would need to come in the office to sign the paperwork as that would be the fastest way verses the postal service.   Debbora Dus, CPP notified  Marijean Niemann, [place] Clinical Pharmacy Assistant [phone]  Time Spent: 2 Minutes

## 2021-10-11 NOTE — Telephone Encounter (Signed)
Form reviewed. Please contact pt's wife to see how she would like to proceed.

## 2021-10-15 DIAGNOSIS — E1169 Type 2 diabetes mellitus with other specified complication: Secondary | ICD-10-CM | POA: Diagnosis not present

## 2021-10-15 DIAGNOSIS — Z794 Long term (current) use of insulin: Secondary | ICD-10-CM | POA: Diagnosis not present

## 2021-10-15 DIAGNOSIS — E785 Hyperlipidemia, unspecified: Secondary | ICD-10-CM

## 2021-10-21 NOTE — Telephone Encounter (Signed)
Sent forms to [place] to print and coordinate signatures.

## 2021-10-21 NOTE — Progress Notes (Signed)
They are going to stop by [facility] to complete the forms. I spoke with the patient about doing so after 02/09.   Debbora Dus, CPP notified  Marijean Niemann, [place] Clinical Pharmacy Assistant [phone]

## 2021-10-28 ENCOUNTER — Telehealth: Payer: Self-pay

## 2021-10-28 NOTE — Telephone Encounter (Signed)
Forms printed and left in front office "patient documents" file folder for patient to come in and sign, and provide proof of income. Please return documents to me once complete.

## 2021-10-28 NOTE — Progress Notes (Signed)
° ° °  Chronic Care Management Pharmacy Assistant   Name: Bradley Davila  MRN: [id number] DOB: Jul 25, 1961  Reason for Encounter: CCM (Lantus 2023 Patient Assistance)   Called [employer] to inquire about status for Lantus patient assistance for 2023. [employer] does not have any paperwork for the patient. Please fax to [phone]. Please include patient full name, date of birth and patient ID #: [id number] on the fax cover sheet.   Debbora Dus, CPP notified  Marijean Niemann, [place] Clinical Pharmacy Assistant [phone]  Time Spent: 10 Minutes

## 2021-10-28 NOTE — Telephone Encounter (Signed)
Bradley Davila received patient forms and tax information but forms were not signed. Please have patient come back to sign forms.

## 2021-10-30 NOTE — Telephone Encounter (Signed)
Spoke with Marcello Moores regarding the PAP forms for the Lantus. He states he signed the forms.I stated we would look over again.   [name], [place] Assistant [phone]  Total time spent for month CPA:  5 min.

## 2021-10-30 NOTE — Telephone Encounter (Signed)
I double checked and Lantus ([employer]) forms are still up front in patient documents file, unsigned. Patient still needs to sign documents and provide proof of income.

## 2021-10-30 NOTE — Telephone Encounter (Signed)
Discovered [employer] Programmer, applications in [employer] tab from Nov 2022. Downloaded and faxed to [employer] again. Will follow up with [employer] in 3-5 days to check on status.

## 2021-11-06 NOTE — Progress Notes (Signed)
Called [employer] to check on status of Lantus application. [employer] is missing ICD Code and dosage per day on page 3 (section 4). The application shows the information on our end. I am not sure whey they do not have the information on their end. I asked if they could take it verbally, but they have to have it faxed. Please refax the entire application to [phone]. Please include patient's full name, date of birth and ID #: [id number] on the fax cover sheet.   Charlene Brooke, CPP notified  Marijean Niemann, [place] Clinical Pharmacy Assistant [phone]  Time Spent: 10 Minutes

## 2021-11-19 ENCOUNTER — Other Ambulatory Visit: Payer: Self-pay

## 2021-11-19 NOTE — Patient Outreach (Signed)
[facility]) Care Management ? ?11/19/2021 ? ?[name] ?1961/01/23 ?[id number] ? ? ?Telephone call to patient for disease management follow up.   No answer.  HIPAA compliant voice message left.   ? ?Plan: If no return call, RN CM will attempt patient again in June. ? ?[name], RN, MSN ?[facility] Care Management ?Care Management Coordinator ?Direct Line [phone] ?Toll Free: [phone]  ?Fax: [phone] ? ?

## 2021-11-25 NOTE — Progress Notes (Signed)
Added to my schedule for 11/29/2021 ? ?Charlene Brooke, CPP notified ? ?Marijean Niemann, RMA ?Clinical Pharmacy Assistant ?[phone] ? ? ?

## 2021-11-25 NOTE — Telephone Encounter (Signed)
Faxed completed application to [employer]. ? ?Will follow up in 3-5 days. ?

## 2021-12-10 ENCOUNTER — Other Ambulatory Visit: Payer: Self-pay | Admitting: Family Medicine

## 2021-12-12 ENCOUNTER — Telehealth: Payer: Self-pay

## 2021-12-12 NOTE — Progress Notes (Signed)
Patient has been approved for 2023. Please see encounter dated 12/12/2021. ? ?Charlene Brooke, CPP notified ? ?Marijean Niemann, RMA ?Clinical Pharmacy Assistant ?[phone] ? ? ?

## 2021-12-12 NOTE — Progress Notes (Signed)
? ? ? ?  Chronic Care Management ?Pharmacy Assistant  ? ?Name: Bradley Davila  MRN: [id number] DOB: 1961/06/14 ? ?Reason for Encounter: CCM (Lantus 2023 PAP Approved) ?  ?Patient assistance for Lantus through [employer] has been approved for 2023. Per [employer] patient's medication was delivered to office on 12/06/2021. I called patient to let him know it was at the office. Patient understood and will go pick it up.  ? ?Charlene Brooke, CPP notified ? ?Marijean Niemann, RMA ?Clinical Pharmacy Assistant ?[phone] ? ? ?

## 2021-12-16 ENCOUNTER — Telehealth: Payer: Self-pay | Admitting: Family Medicine

## 2021-12-16 ENCOUNTER — Other Ambulatory Visit: Payer: Self-pay | Admitting: Family Medicine

## 2021-12-16 NOTE — Telephone Encounter (Signed)
Pt came in to see if medication was ready at the office. Mendel Ryder was not here to check on it. Pt will be back to pick it up, they also brought paperwork for it. I will leave it in Duncan's box.  ?

## 2021-12-17 NOTE — Telephone Encounter (Signed)
Patient picked up medication yesterday; sorry I did not know there was a phone note about this.  ?

## 2021-12-19 DIAGNOSIS — Z0100 Encounter for examination of eyes and vision without abnormal findings: Secondary | ICD-10-CM | POA: Diagnosis not present

## 2021-12-19 DIAGNOSIS — H401131 Primary open-angle glaucoma, bilateral, mild stage: Secondary | ICD-10-CM | POA: Diagnosis not present

## 2021-12-19 LAB — HM DIABETES EYE EXAM

## 2021-12-30 ENCOUNTER — Ambulatory Visit (INDEPENDENT_AMBULATORY_CARE_PROVIDER_SITE_OTHER): Payer: Medicare HMO | Admitting: Family Medicine

## 2021-12-30 ENCOUNTER — Encounter: Payer: Self-pay | Admitting: Family Medicine

## 2021-12-30 VITALS — BP 122/78 | HR 82 | Temp 97.2°F | Ht 66.0 in | Wt 220.0 lb

## 2021-12-30 DIAGNOSIS — E1169 Type 2 diabetes mellitus with other specified complication: Secondary | ICD-10-CM | POA: Diagnosis not present

## 2021-12-30 DIAGNOSIS — R059 Cough, unspecified: Secondary | ICD-10-CM

## 2021-12-30 DIAGNOSIS — G40201 Localization-related (focal) (partial) symptomatic epilepsy and epileptic syndromes with complex partial seizures, not intractable, with status epilepticus: Secondary | ICD-10-CM | POA: Diagnosis not present

## 2021-12-30 DIAGNOSIS — R21 Rash and other nonspecific skin eruption: Secondary | ICD-10-CM

## 2021-12-30 DIAGNOSIS — G40309 Generalized idiopathic epilepsy and epileptic syndromes, not intractable, without status epilepticus: Secondary | ICD-10-CM | POA: Diagnosis not present

## 2021-12-30 DIAGNOSIS — Z794 Long term (current) use of insulin: Secondary | ICD-10-CM

## 2021-12-30 DIAGNOSIS — Z7189 Other specified counseling: Secondary | ICD-10-CM

## 2021-12-30 DIAGNOSIS — Z0000 Encounter for general adult medical examination without abnormal findings: Secondary | ICD-10-CM | POA: Diagnosis not present

## 2021-12-30 DIAGNOSIS — E785 Hyperlipidemia, unspecified: Secondary | ICD-10-CM | POA: Diagnosis not present

## 2021-12-30 LAB — COMPREHENSIVE METABOLIC PANEL
ALT: 26 U/L (ref 0–53)
AST: 18 U/L (ref 0–37)
Albumin: 4.5 g/dL (ref 3.5–5.2)
Alkaline Phosphatase: 88 U/L (ref 39–117)
BUN: 15 mg/dL (ref 6–23)
CO2: 29 mEq/L (ref 19–32)
Calcium: 9.7 mg/dL (ref 8.4–10.5)
Chloride: 99 mEq/L (ref 96–112)
Creatinine, Ser: 1 mg/dL (ref 0.40–1.50)
GFR: 80.87 mL/min (ref >60.00)
Glucose, Bld: 118 mg/dL — ABNORMAL HIGH (ref 70–99)
Potassium: 4.5 mEq/L (ref 3.5–5.1)
Sodium: 136 mEq/L (ref 135–145)
Total Bilirubin: 0.4 mg/dL (ref 0.2–1.2)
Total Protein: 7.6 g/dL (ref 6.0–8.3)

## 2021-12-30 LAB — LIPID PANEL
Cholesterol: 162 mg/dL (ref 0–200)
HDL: 41.6 mg/dL (ref >39.00)
NonHDL: 120.19
Total CHOL/HDL Ratio: 4
Triglycerides: 224 mg/dL — ABNORMAL HIGH (ref 0.0–149.0)
VLDL: 44.8 mg/dL — ABNORMAL HIGH (ref 0.0–40.0)

## 2021-12-30 LAB — LDL CHOLESTEROL, DIRECT: Direct LDL: 99 mg/dL

## 2021-12-30 LAB — CBC WITH DIFFERENTIAL/PLATELET
Basophils Absolute: 0.1 10*3/uL (ref 0.0–0.1)
Basophils Relative: 0.7 % (ref 0.0–3.0)
Eosinophils Absolute: 0.1 10*3/uL (ref 0.0–0.7)
Eosinophils Relative: 1.1 % (ref 0.0–5.0)
HCT: 44.7 % (ref 39.0–52.0)
Hemoglobin: 14.9 g/dL (ref 13.0–17.0)
Lymphocytes Relative: 25.6 % (ref 12.0–46.0)
Lymphs Abs: 2.7 10*3/uL (ref 0.7–4.0)
MCHC: 33.4 g/dL (ref 30.0–36.0)
MCV: 88.7 fl (ref 78.0–100.0)
Monocytes Absolute: 0.6 10*3/uL (ref 0.1–1.0)
Monocytes Relative: 6 % (ref 3.0–12.0)
Neutro Abs: 7 10*3/uL (ref 1.4–7.7)
Neutrophils Relative %: 66.6 % (ref 43.0–77.0)
Platelets: 344 10*3/uL (ref 150.0–400.0)
RBC: 5.04 Mil/uL (ref 4.22–5.81)
RDW: 13.9 % (ref 11.5–15.5)
WBC: 10.6 10*3/uL — ABNORMAL HIGH (ref 4.0–10.5)

## 2021-12-30 LAB — TSH: TSH: 1.14 u[IU]/mL (ref 0.35–5.50)

## 2021-12-30 LAB — HEMOGLOBIN A1C: Hgb A1c MFr Bld: 8.8 % — ABNORMAL HIGH (ref 4.6–6.5)

## 2021-12-30 MED ORDER — NYSTATIN 100000 UNIT/GM EX POWD: 1.0000 "application " | Freq: Two times a day (BID) | CUTANEOUS | 0 refills | Status: AC

## 2021-12-30 NOTE — Progress Notes (Signed)
I have personally reviewed the Medicare Annual Wellness questionnaire and have noted ?1. The patient's medical and social history ?2. Their use of alcohol, tobacco or illicit drugs ?3. Their current medications and supplements ?4. The patient's functional ability including ADL's, fall risks, home safety risks and hearing or visual ?            impairment. ?5. Diet and physical activities ?6. Evidence for depression or mood disorders ? ?The patients weight, height, BMI have been recorded in the chart and visual acuity is per eye clinic.  ?I have made referrals, counseling and provided education to the patient based review of the above and I have provided the pt with a written personalized care plan for preventive services. ? ?Provider list updated- see scanned forms.  Routine anticipatory guidance given to patient.  See health maintenance. The possibility exists that previously documented standard health maintenance information may have been brought forward from a previous encounter into this note.  If needed, that same information has been updated to reflect the current situation based on today's encounter.   ? ?Flu encouraged in general but deferred since we are not in the middle of flu season given his recent staring spell. ?Shingles discussed with patient ?PNA discussed with patient ?Tetanus discussed with patient ?COVID-vaccine previously done ?Colon cancer screening deferred until he can see neurology given recent events. ?Prostate cancer screening and PSA options (with potential risks and benefits of testing vs not testing) were discussed along with recent recs/guidelines.  He declined testing PSA at this point. ?Advance directive-wife designated if patient were incapacitated. ?Cognitive function addressed- see scanned forms- and if abnormal then additional documentation follows.  ? ?In addition to [facility], follow up visit for the below conditions: ? ?Diabetes:  ?Using medications without  difficulties: yes ?Hypoglycemic episodes: no ?Hyperglycemic episodes:no ?Feet problems: some pain at baseline.   ?Blood Sugars averaging: ~140 this AM.  Occ lower.   ?eye exam within last year: yes ?He is walking more for exercise.  ? ?Elevated Cholesterol: ?Using medications without problems: yes ?Muscle aches: some cramping.   ?Diet compliance:  d/w pt.   ?Exercise: d/w pt.  ?He feels fatigued with AM lipitor use but tolerates it at night.   ? ?Itching in the axilla and hands/ankles.  Rash noted.  See exam. ? ?H/o SZ.  Possible staring spell recently followed by fatigue.  Rare events.  More likely to happen with sugar variation or elevation, less common with better sugar control.  He has neurology f/u pending.  Compliant with med, rarely with missed dose.   ? ?Coughing noted.  More at night.  Wife noted that he sounded stuffy at night.  Noted more with recently presumed sinus infection, wife had similar.  Sx getting better but not fully resolved, sx started about 1 month ago.   ? ?PMH and SH reviewed ? ?Meds, vitals, and allergies reviewed.  ? ?ROS: Per HPI.  Unless specifically indicated otherwise in HPI, the patient denies: ? ?General: fever. ?Eyes: acute vision changes ?ENT: sore throat ?Cardiovascular: chest pain ?Respiratory: SOB ?GI: vomiting ?GU: dysuria ?Musculoskeletal: acute back pain ?Derm: acute rash ?Neuro: acute motor dysfunction ?Psych: worsening mood ?Endocrine: polydipsia ?Heme: bleeding ?Allergy: hayfever ? ?GEN: nad, alert and oriented ?HEENT: ncat ?NECK: supple w/o LA ?CV: rrr. ?PULM: ctab, no inc wob ?ABD: soft, +bs ?EXT: no edema ?SKIN: chronic rash R axilla.   ?B palm chronic rash.   ?

## 2021-12-30 NOTE — Patient Instructions (Addendum)
Use nystatin on the rash and see if that helps.   ?Stop the lipitor for about 1-2 weeks and see if you feel better.  ?If so, then try to restart it at 1/2 tab a day.   ?Go to the lab on the way out.   If you have mychart we'll likely use that to update you.    ?Take care.  Glad to see you. ? ?

## 2022-01-01 LAB — 10-HYDROXYCARBAZEPINE: Triliptal/MTB(Oxcarbazepin): 29.8 ug/mL (ref 8.0–35.0)

## 2022-01-02 ENCOUNTER — Ambulatory Visit: Payer: Medicare HMO | Admitting: Adult Health

## 2022-01-02 ENCOUNTER — Encounter: Payer: Self-pay | Admitting: Adult Health

## 2022-01-02 VITALS — BP 124/86 | HR 80 | Ht 66.0 in | Wt 220.0 lb

## 2022-01-02 DIAGNOSIS — R569 Unspecified convulsions: Secondary | ICD-10-CM | POA: Diagnosis not present

## 2022-01-02 DIAGNOSIS — R059 Cough, unspecified: Secondary | ICD-10-CM | POA: Insufficient documentation

## 2022-01-02 MED ORDER — LEVETIRACETAM 1000 MG PO TABS: ORAL_TABLET | ORAL | 1 refills | Status: DC

## 2022-01-02 NOTE — Patient Instructions (Addendum)
Your Plan: ? ?Continue Trileptal  ?Increase Keppra 1000 mg In the AM and 1500 mg in the PM ?EEG ordered ?If you continue to have events despite increasing Keppra please let us know ? ?If your symptoms worsen or you develop new symptoms please let us know.  ? ? ?Thank you for coming to see [place] at [facility]. I hope we have been able to provide you high quality care today. ? ?You may receive a patient satisfaction survey over the next few weeks. We would appreciate your feedback and comments so that we may continue to improve ourselves and the health of our patients. ? ?

## 2022-01-02 NOTE — Assessment & Plan Note (Addendum)
Stop the lipitor for about 1-2 weeks and he will see if he notes a difference in cramping.  If so, then try to restart it at 1/2 tab a day.  Update me as needed.  Discussed with patient about diet.  See notes on labs. ?

## 2022-01-02 NOTE — Assessment & Plan Note (Signed)
Improving per report and lungs are clear.  He can update me as needed. ?

## 2022-01-02 NOTE — Assessment & Plan Note (Signed)
Advance directive- wife designated if patient were incapacitated.  

## 2022-01-02 NOTE — Assessment & Plan Note (Signed)
History of, compliant with medication with recent staring spell noted.  He has neurology follow-up pending.  Check drug levels on routine labs today. ?

## 2022-01-02 NOTE — Assessment & Plan Note (Signed)
Flu encouraged in general but deferred since we are not in the middle of flu season given his recent staring spell. ?Shingles discussed with patient ?PNA discussed with patient ?Tetanus discussed with patient ?COVID-vaccine previously done ?Colon cancer screening deferred until he can see neurology given recent events. ?Prostate cancer screening and PSA options (with potential risks and benefits of testing vs not testing) were discussed along with recent recs/guidelines.  He declined testing PSA at this point. ?Advance directive-wife designated if patient were incapacitated. ?Cognitive function addressed- see scanned forms- and if abnormal then additional documentation follows.  ?

## 2022-01-02 NOTE — Assessment & Plan Note (Signed)
Looks typical for superficial fungal infection. ?He can use nystatin on the rash and see if that helps.  He can update me as needed. ?

## 2022-01-02 NOTE — Assessment & Plan Note (Signed)
Continue insulin 38 units, metformin.  See notes on labs. ?

## 2022-01-02 NOTE — Progress Notes (Signed)
? ? ?PATIENT: Bradley Davila ?DOB: 1960/10/10 ? ?REASON FOR VISIT: follow up ?Bradley FROM: patient ?Primary neurologist: Dr. Brett Fairy ? ?Chief Complaint  ?Patient presents with  ? RM 4  ?  Here with wife. No grand mal seizures since the last visit. Still has the petit seizures, staring. States Dr Damita Dunnings checked levels recently. Patient needs refills of his medications.   ? ? ? ?Bradley OF PRESENT ILLNESS: ?Today 01/02/22: ? ?Bradley Davila is a 61 year old male with a Bradley of seizures.  He returns today for follow-up.  His Wife reports that he had an event this week. Event this week was not witnessed. Patient doesn't know what happened. He was in his living room. Reports that all he knows is that his eyes did something funny. Reports for the rest of the day he was foggy headed and slept most of the day. Patient reports that he has had other "small" events. Wife reports that  the events she has ever witnessed --he makes a noise, turns his head, and makes a facial expression as if he is in pain.  No convulsing noted.  Fluorescein lights bother him.  ? ?Wife reports that mentally he seems to get confused more.  She states that she plans to start monitoring his medications more.  They do report that in the past MRI imaging was done and they were told that he had "adhesions" on the brain.  I do not have access to this report to review today. ? ? ?01/02/21: Bradley Davila is a 61 year old male with a Bradley of seizures and cerebral palsy.  He returns today for follow-up.  He is here today with his wife.  She states that last weekend they were at [facility] visiting the campus for their daughter.  She states that he had an episode in the car where he was staring off.  He was not responsive to stimuli.  She states once the event was over he was back to normal.  He denies missing any medication.  Continues on Keppra 1000 mg twice a day and Trileptal 600 mg twice a day.  He states he has not taken his Keppra dose this  morning due to not eating yet.  He returns today for an evaluation. ? ?01/03/20: Bradley Davila is a 61 year old male with a Bradley of seizures and cerebral palsy.  He returns today for follow-up.  He is currently on Keppra 1000 mg twice a day and Trileptal 600 mg twice a day.  He denies any seizure events.  Continues to tolerate the medications well.  He does not operate a motor vehicle.  Reports in the last week he has noticed some numbness in the left hand.  Also reports that he drops things easily.  His wife thought that he may be slept on his hand the wrong way.  He denies any discomfort of the arm.  He returns today for an evaluation. ? ?Bradley Davila is a 61 y.o. male and seen here as a referral/ revisit  from Dr. Damita Dunnings for a transfer of care. 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 In addition he has diabetes and has developed depression, he underwent a cholecystectomy while still being my patient in 2007. In the meantime he had been followed by my former Environmental manager O'Donovan at  [facility], but would like to return to the general neurology practice here for reasons of proximity, as well as easier emergency care. ?Dr. Ginny Forth noted that the patient's seizure disorder has been very well controlled and has actually just refilled medications for the last years without any additional diagnostic studies being undertaken. For this reason I will continue his current medications.  ?  ?I will try to obtain his paper records form years ago- if they still exist.  ?  ? ?REVIEW OF SYSTEMS: Out of a complete 14 system review of symptoms, the patient complains only of the following symptoms, and all other reviewed systems are negative. ? ?ALLERGIES: ?Allergies  ?Allergen Reactions  ? Benadryl [Diphenhydramine Hcl]    ?  Caused seizures.   ? Yellow Jacket Venom [Honey Bee Venom]   ?  Facial swelling after sting  ? Carbamazepine   ?  Inc in seizures  ? Lamotrigine   ?  Inc in seizures  ? Metformin And Related   ?  Diarrhea with higher doses  ? Phenobarbital   ?  rash  ? ? ?HOME MEDICATIONS: ?Outpatient Medications Prior to Visit  ?Medication Sig Dispense Refill  ? acetaminophen (TYLENOL) 325 MG tablet You can take 2 tablets every 6 hours as needed for pain. Use this first then the prescribed pain medication.   ?DO NOT TAKE MORE THAN 4000 MG OF TYLENOL PER DAY.  IT CAN HARM YOUR LIVER.    ? atorvastatin (LIPITOR) 20 MG tablet TAKE 1 TABLET EVERY DAY 90 tablet 3  ? clotrimazole-betamethasone (LOTRISONE) cream Apply 1 application topically daily. 30 g 2  ? EPINEPHrine 0.3 mg/0.3 mL IJ SOAJ injection Inject 0.3 mg into the muscle as needed.    ? insulin glargine (LANTUS SOLOSTAR) 100 UNIT/ML Solostar Pen Inject 38 Units into the skin daily. 15 mL   ? latanoprost (XALATAN) 0.005 % ophthalmic solution     ? levETIRAcetam (KEPPRA) 1000 MG tablet TAKE 1 TABLET BY MOUTH TWICE A DAY 180 tablet 1  ? meloxicam (MOBIC) 15 MG tablet TAKE 1 TABLET EVERY DAY WITH FOOD AS NEEDED FOR PAIN 90 tablet 3  ? metFORMIN (GLUCOPHAGE-XR) 500 MG 24 hr tablet Take 500 mg by mouth in the morning and at bedtime.    ? nystatin (MYCOSTATIN/NYSTOP) powder Apply 1 application. topically 2 (two) times daily. 60 g 0  ? oxcarbazepine (TRILEPTAL) 600 MG tablet TAKE 1 TABLET TWICE DAILY 180 tablet 2  ? ramipril (ALTACE) 1.25 MG capsule TAKE 1 CAPSULE BY MOUTH EVERY DAY 90 capsule 1  ? triamcinolone cream (KENALOG) 0.5 % Apply 1 application topically 2 (two) times daily as needed (for itchy spots on skin). 30 g 2  ? TRUE METRIX BLOOD GLUCOSE TEST test strip TEST BLOOD SUGAR ONE TIME DAILY OR AS NEEDED AS DIRECTED 100 strip 12  ? TRUEPLUS LANCETS 33G MISC Use as instructed to test blood sugar once daily or as needed.  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 100 each 3   ? ?No facility-administered medications prior to visit.  ? ? ?PAST MEDICAL Bradley: ?Past Medical Bradley:  ?Diagnosis Date  ? Complication of anesthesia   ? slow to wake up from anesthesia after gall bladder surgery  ? 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 Diabetes mellitus without complication ([place])   ? Dysphagia as late effect of stroke   ? Headache(784.0)   ? Hypertension   ? Seizures (  [place])   ? h/o grand mal and staring episodes, followed by [facility] neuro, last grand mal July 09, 2013 last seizure  ? 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 Stroke [facility])   ? childhood  ? ? ?PAST SURGICAL Bradley: ?Past Surgical Bradley:  ?Procedure Laterality Date  ? CHOLECYSTECTOMY  2007  ? CYSTECTOMY    ? on buttock, s/p removal  ? HERNIA REPAIR    ? INSERTION OF MESH N/A 08/05/2017  ? Procedure: INSERTION OF MESH;  Surgeon: Coralie Keens, MD;  Location: [place];  Service: General;  Laterality: N/A;  ? TONSILLECTOMY    ? UMBILICAL HERNIA REPAIR  08/05/2017  ? UMBILICAL HERNIA REPAIR N/A 08/05/2017  ? Procedure: UMBILICAL HERNIA REPAIR;  Surgeon: Coralie Keens, MD;  Location: [place];  Service: General;  Laterality: N/A;  ? WISDOM TOOTH EXTRACTION    ? ? ?FAMILY Bradley: ?Family Bradley  ?Problem Relation Age of Onset  ? Hydrocephalus Mother   ? Liver cancer Father   ? Alcohol abuse Father   ? Alcohol abuse Sister   ? Heart disease Brother   ? Colon cancer Neg Hx   ? Prostate cancer Neg Hx   ? ? ?SOCIAL Bradley: ?Social Bradley  ? ?Socioeconomic Bradley  ? Marital status: Married  ?  Spouse name: Clarene Critchley  ? Number of children: 1  ? Years of education: Not on file  ? Highest education level: Not on file  ?Occupational Bradley  ?  Employer: NOT EMPLOYED  ?Tobacco Use  ? Smoking status: Some Days  ?  Types: Cigarettes  ? Smokeless tobacco: Never  ? Tobacco comments:  ?  Black and mild only   ?Vaping Use  ? Vaping Use: Never used  ?Substance and  Sexual Activity  ? Alcohol use: Yes  ?  Comment: rarely - 4 to 5 times per year  ? Drug use: No  ? Sexual activity: Yes  ?Other Topics Concern  ? Not on file  ?Social Bradley Narrative  ? Married 2005, 1 dau

## 2022-01-03 LAB — LEVETIRACETAM LEVEL: Keppra (Levetiracetam): 29.9 ug/mL

## 2022-01-07 ENCOUNTER — Ambulatory Visit: Payer: Medicare HMO | Admitting: Neurology

## 2022-01-07 DIAGNOSIS — R569 Unspecified convulsions: Secondary | ICD-10-CM

## 2022-01-13 NOTE — Procedures (Signed)
? ? ?  History: ? ?61 year old man with seizure ? ?EEG classification: Awake and drowsy ? ?Description of the recording: The background rhythms of this recording consists of a fairly well modulated medium amplitude alpha rhythm of 10 Hz that is reactive to eye opening and closure. As the record progresses, the patient appears to remain in the waking state throughout the recording. Photic stimulation was performed, did not show any abnormalities. Hyperventilation was also performed, did not show any abnormalities. Toward the end of the recording, the patient enters the drowsy state with slight symmetric slowing seen. The patient never enters stage II sleep. No abnormal epileptiform discharges seen during this recording. There was no focal slowing. EKG monitor shows no evidence of cardiac rhythm abnormalities with a heart rate of 72. ? ?Abnormality: None  ? ?Impression: This is a normal EEG recording in the waking and drowsy state. No evidence of interictal epileptiform discharges seen. A normal EEG does not exclude a diagnosis of epilepsy.  ? ? ?Alric Ran, MD ?[facility] ?  ?

## 2022-02-18 ENCOUNTER — Other Ambulatory Visit: Payer: Self-pay

## 2022-02-18 NOTE — Patient Outreach (Signed)
[facility]) Care Management  02/18/2022  Bradley Davila 1960-11-17 [id number]   Telephone call to patient for disease management follow up. Patient reports he is doing good.  Encouraged continued diabetes management. No concerns.    Care Plan : RN Care Manager Plan of Care  Updates made by Jon Billings, RN since 02/18/2022 12:00 AM     Problem: Chronic Disease Management for diabetes   Priority: High     Long-Range Goal: Development of plan of care for management of diabetes   Start Date: 08/21/2021  Expected End Date: 09/14/2022  This Visit's Progress: On track  Priority: High  Note:   Current Barriers:  Chronic Disease Management support and education needs related to DMII   RNCM Clinical Goal(s):  Patient will verbalize basic understanding of  DMII disease process and self health management plan as evidenced by A1c less than 8.0 continue to work with RN Care Manager to address care management and care coordination needs related to  DMII as evidenced by adherence to CM Team Scheduled appointments through collaboration with RN Care manager, provider, and care team.   Interventions: Education and support related to diabetes management Inter-disciplinary care team collaboration (see longitudinal plan of care) Evaluation of current treatment plan related to  self management and patient's adherence to plan as established by provider   Diabetes Interventions:  (Status:  New goal.) Long Term Goal   08/21/21 Congratulated patient on lowering of A1c.   Assessed patient's understanding of A1c goal: <8% Provided education to patient about basic DM disease process Discussed plans with patient for ongoing care management follow up and provided patient with direct contact information for care management team Reviewed scheduled/upcoming provider appointments including: Eye Doctor 09/24/21 Lab Results  Component Value Date   HGBA1C 7.5 (A) 05/30/2021  02/18/22 Patient doing  good.  He reports blood sugar numbers depends on when and what he eats.  This morning blood sugar 121.  Last A1c 12-30-21 8.8.  Encouraged carbohydrate control and eating on a regular schedule.  No concerns.    Patient Goals/Self-Care Activities: Diabetes keep appointment with eye doctor check blood sugar at prescribed times: once daily       Limit carbs such as rice, potatoes, breads, and pastas.        Limit sweets and sugary drinks.         Control portions.  Follow Up Plan:  Telephone follow up appointment with care management team member scheduled for:  September The patient has been provided with contact information for the care management team and has been advised to call with any health related questions or concerns.      Plan: Follow-up: Patient agrees to Care Plan and Follow-up. Follow-up in September.  [name], RN, MSN [facility] Care Management Care Management Coordinator Direct Line [phone] Toll Free: [phone]  Fax: [phone]

## 2022-02-18 NOTE — Patient Instructions (Signed)
Patient Goals/Self-Care Activities: Diabetes keep appointment with eye doctor check blood sugar at prescribed times: once daily       Limit carbs such as rice, potatoes, breads, and pastas.        Limit sweets and sugary drinks.         Control portions.

## 2022-03-10 ENCOUNTER — Telehealth: Payer: Self-pay | Admitting: Adult Health

## 2022-03-10 MED ORDER — OXCARBAZEPINE 600 MG PO TABS: 600.0000 mg | ORAL_TABLET | Freq: Two times a day (BID) | ORAL | 2 refills | Status: DC

## 2022-03-10 NOTE — Telephone Encounter (Signed)
Pt request refill for oxcarbazepine (TRILEPTAL) 600 MG tablet at [facility]

## 2022-03-12 ENCOUNTER — Other Ambulatory Visit: Payer: Self-pay | Admitting: Family Medicine

## 2022-03-24 ENCOUNTER — Telehealth: Payer: Self-pay | Admitting: Family Medicine

## 2022-03-24 NOTE — Telephone Encounter (Signed)
Patient came to pick up lantus- two refrigerated and labeled boxes given to patient.

## 2022-03-27 ENCOUNTER — Telehealth: Payer: Self-pay

## 2022-03-27 NOTE — Progress Notes (Signed)
    Chronic Care Management Pharmacy Assistant   Name: Bradley Davila  MRN: [id number] DOB: [birth year]  Reason for Encounter: CCM (Appointment Reminder)  Recent office visits:  12/30/21 AWV Abnormal Labs: "continue as planned per neurology and also with holding his statin temporarily.  His triglycerides are elevated and this corresponds to his A1c increase.  His other labs are stable.  I would work more on diet instead of changing his diabetes medication and recheck his labs at a visit in 3 months" Stop: lipitor for about 1-2 weeks and see if he notes a difference in cramping.  If so, then try to restart it at 1/2 tab a day. Start: nystatin NYCOSTATIN/NYSTOP) powder   Recent consult visits:  01/07/22 Alric Ran (Neurology) Unspecified convulsions Procedure: EEG Impression: No evidence of interictal epileptiform discharges seen. 01/02/22 Ward Givens, NP (Neurology) Seizures Ordered: EEG Change: Levetiracetam 1000 mg in the a.m. and 1500 mg in the p.m FU 4- 5 months  Hospital visits:  None in previous 6 months  Medications: Outpatient Encounter Medications as of 03/27/2022  Medication Sig   acetaminophen (TYLENOL) 325 MG tablet You can take 2 tablets every 6 hours as needed for pain. Use this first then the prescribed pain medication.   DO NOT TAKE MORE THAN 4000 MG OF TYLENOL PER DAY.  IT CAN HARM YOUR LIVER.   atorvastatin (LIPITOR) 20 MG tablet TAKE 1 TABLET EVERY DAY   clotrimazole-betamethasone (LOTRISONE) cream Apply 1 application topically daily.   EPINEPHrine 0.3 mg/0.3 mL IJ SOAJ injection Inject 0.3 mg into the muscle as needed.   insulin glargine (LANTUS SOLOSTAR) 100 UNIT/ML Solostar Pen Inject 38 Units into the skin daily.   latanoprost (XALATAN) 0.005 % ophthalmic solution    levETIRAcetam (KEPPRA) 1000 MG tablet Take 1 tablet in the AM and 1.5 tablets at bedtime   meloxicam (MOBIC) 15 MG tablet TAKE 1 TABLET EVERY DAY WITH FOOD AS NEEDED FOR PAIN   metFORMIN  (GLUCOPHAGE-XR) 500 MG 24 hr tablet Take 500 mg by mouth in the morning and at bedtime.   nystatin (MYCOSTATIN/NYSTOP) powder Apply 1 application. topically 2 (two) times daily.   oxcarbazepine (TRILEPTAL) 600 MG tablet Take 1 tablet (600 mg total) by mouth 2 (two) times daily.   ramipril (ALTACE) 1.25 MG capsule TAKE 1 CAPSULE BY MOUTH EVERY DAY   triamcinolone cream (KENALOG) 0.5 % Apply 1 application topically 2 (two) times daily as needed (for itchy spots on skin).   TRUE METRIX BLOOD GLUCOSE TEST test strip TEST BLOOD SUGAR ONE TIME DAILY OR AS NEEDED AS DIRECTED   TRUEPLUS LANCETS 33G MISC Use as instructed to test blood sugar once daily or as needed.  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   No facility-administered encounter medications on file as of 03/27/2022.   Bradley Davila was contacted to remind of upcoming telephone visit with Charlene Brooke  on 04/01/2022 at 11:00. Patient was reminded to have any blood glucose and blood pressure readings available for review at appointment.   Message was left reminding patient of appointment.  CCM referral has been placed prior to visit?  Yes   Star Rating Drugs: Medication:  Last Fill: Day Supply Atorvastatin 20 mg 12/17/21 90 Metformin 500 mg 09/01/20 90  Ramipril 1.25 mg 03/12/22 90  Fill dates verified with [facility]  Charlene Brooke, CPP notified  Marijean Niemann, [place] Clinical Pharmacy Assistant [phone]

## 2022-04-01 ENCOUNTER — Other Ambulatory Visit: Payer: Self-pay

## 2022-04-01 ENCOUNTER — Ambulatory Visit (INDEPENDENT_AMBULATORY_CARE_PROVIDER_SITE_OTHER): Payer: Medicare HMO | Admitting: Pharmacist

## 2022-04-01 DIAGNOSIS — E785 Hyperlipidemia, unspecified: Secondary | ICD-10-CM

## 2022-04-01 DIAGNOSIS — G40201 Localization-related (focal) (partial) symptomatic epilepsy and epileptic syndromes with complex partial seizures, not intractable, with status epilepticus: Secondary | ICD-10-CM

## 2022-04-01 DIAGNOSIS — E1169 Type 2 diabetes mellitus with other specified complication: Secondary | ICD-10-CM

## 2022-04-01 NOTE — Progress Notes (Signed)
Chronic Care Management Pharmacy Note  04/10/2022 Name:  Bradley Davila MRN:  [id number] DOB:  [birth year]  Summary: CCM F/U visit -Reviewed medications; pt affirms compliance except with metformin -DM: A1c 8.8% (12/2021), pt stopped metformin on his own ~2 months ago due to diarrhea which has resolved off medication; he did not have any BG readings available though qualitatively reports they have been "good" on Lantus alone -HLD/hx stroke: LDL 99 (12/2021) on atorvastatin 20 mg, pt is now taking 1/2 dose (10 mg) daily, he may require ezetimibe or alternate statin in future to achieve LDL goal < 70  Recommendations/Changes made from today's visit: -Pt is due for PCP F/U (In April PCP advised 3-mo f/u for DM) - pt declined to schedule today, will call back with availability  Plan: -[place] will call patient within 30 days re: scheduling PCP appt -Pharmacist follow up televisit scheduled for 2 months    Subjective: Bradley Davila is an 61 y.o. year old male who is a primary patient of Damita Dunnings, Elveria Rising, MD.  The CCM team was consulted for assistance with disease management and care coordination needs.    Engaged with patient by telephone for follow up visit in response to provider referral for pharmacy case management and/or care coordination services.   Consent to Services:  The patient was given information about Chronic Care Management services, agreed to services, and gave verbal consent prior to initiation of services.  Please see initial visit note for detailed documentation.   Patient Care Team: Tonia Ghent, MD as PCP - General (Family Medicine) Debbora Dus, [facility] as Pharmacist (Pharmacist) Eulogio Bear, MD as Consulting Physician (Ophthalmology)  Recent office visits: 12/30/21 Dr Damita Dunnings OV: AWV - hold lipitor x 1-2 weeks, restart at 1/2 tab. F/U 3 months.  Recent consult visits: 01/07/22 Alric Ran (Neurology) Unspecified convulsions Procedure: EEG  Impression: No evidence of interictal epileptiform discharges seen.  01/02/22 Ward Givens, NP (Neurology) Seizures Ordered: EEG - increase Keppra to 1000 mg AM and 1500 mg PM.   Hospital visits: None in previous 6 months   Objective:  Lab Results  Component Value Date   CREATININE 1.00 12/30/2021   BUN 15 12/30/2021   GFR 80.87 12/30/2021   EGFR 74 01/02/2021   GFRNONAA 87 01/03/2020   GFRAA 100 01/03/2020   NA 136 12/30/2021   K 4.5 12/30/2021   CALCIUM 9.7 12/30/2021   CO2 29 12/30/2021   GLUCOSE 118 (H) 12/30/2021    Lab Results  Component Value Date/Time   HGBA1C 8.8 (H) 12/30/2021 01:46 PM   HGBA1C 7.5 (A) 05/30/2021 11:03 AM   HGBA1C 10.1 (A) 02/26/2021 11:30 AM   HGBA1C 7.9 (H) 10/31/2019 10:51 AM   GFR 80.87 12/30/2021 01:46 PM   GFR 78.03 10/31/2019 10:51 AM   MICROALBUR 24.2 (H) 10/31/2019 10:51 AM   MICROALBUR 57.8 (H) 10/26/2018 08:38 AM    Last diabetic Eye exam:  Lab Results  Component Value Date/Time   HMDIABEYEEXA No Retinopathy 12/19/2021 12:00 AM    Last diabetic Foot exam: No results found for: "HMDIABFOOTEX"   Lab Results  Component Value Date   CHOL 162 12/30/2021   HDL 41.60 12/30/2021   LDLCALC 69 10/31/2019   LDLDIRECT 99.0 12/30/2021   TRIG 224.0 (H) 12/30/2021   CHOLHDL 4 12/30/2021       Latest Ref Rng & Units 12/30/2021    1:46 PM 01/02/2021   11:50 AM 01/03/2020   11:30 AM  Hepatic Function  Total Protein 6.0 - 8.3 g/dL 7.6  7.7  7.2   Albumin 3.5 - 5.2 g/dL 4.5  4.4  4.5   AST 0 - 37 U/L $Remo'18  19  18   'bNrFy$ ALT 0 - 53 U/L $Remo'26  24  23   'jwXaO$ Alk Phosphatase 39 - 117 U/L 88  101  89   Total Bilirubin 0.2 - 1.2 mg/dL 0.4  0.3  <0.2     Lab Results  Component Value Date/Time   TSH 1.14 12/30/2021 01:46 PM   TSH 1.188 10/21/2006 09:14 PM       Latest Ref Rng & Units 12/30/2021    1:46 PM 01/02/2021   11:50 AM 01/03/2020   11:30 AM  CBC  WBC 4.0 - 10.5 K/uL 10.6  9.1  9.9   Hemoglobin 13.0 - 17.0 g/dL 14.9  15.2  15.0    Hematocrit 39.0 - 52.0 % 44.7  44.8  42.6   Platelets 150.0 - 400.0 K/uL 344.0  346  373     No results found for: "VD25OH"  Clinical ASCVD: Yes  The ASCVD Risk score (Arnett DK, et al., 2019) failed to calculate for the following reasons:   The patient has a prior MI or stroke diagnosis       02/18/2022    9:35 AM 08/21/2021   10:45 AM 05/12/2021    8:52 AM  Depression screen PHQ 2/9  Decreased Interest 0 0 2  Down, Depressed, Hopeless 0 0 2  PHQ - 2 Score 0 0 4  Altered sleeping   0  Tired, decreased energy   0  Change in appetite   0  Feeling bad or failure about yourself    0  Trouble concentrating   0  Moving slowly or fidgety/restless   0  Suicidal thoughts   0  PHQ-9 Score   4  Difficult doing work/chores   Not difficult at all     Social History   Tobacco Use  Smoking Status Some Days   Types: Cigarettes  Smokeless Tobacco Never  Tobacco Comments   Black and mild only    BP Readings from Last 3 Encounters:  01/02/22 124/86  12/30/21 122/78  05/30/21 126/82   Pulse Readings from Last 3 Encounters:  01/02/22 80  12/30/21 82  05/30/21 87   Wt Readings from Last 3 Encounters:  01/02/22 220 lb (99.8 kg)  12/30/21 220 lb (99.8 kg)  05/30/21 217 lb (98.4 kg)   BMI Readings from Last 3 Encounters:  01/02/22 35.51 kg/m  12/30/21 35.51 kg/m  05/30/21 35.02 kg/m    Assessment/Interventions: Review of patient past medical history, allergies, medications, health status, including review of consultants reports, laboratory and other test data, was performed as part of comprehensive evaluation and provision of chronic care management services.   SDOH:  (Social Determinants of Health) assessments and interventions performed: No  - done June 2023  SDOH Screenings   Alcohol Screen: Low Risk  (05/12/2021)   Alcohol Screen    Last Alcohol Screening Score (AUDIT): 0  Depression (PHQ2-9): Low Risk  (02/18/2022)   Depression (PHQ2-9)    PHQ-2 Score: 0  Financial  Resource Strain: High Risk (09/30/2021)   Overall Financial Resource Strain (CARDIA)    Difficulty of Paying Living Expenses: Hard  Food Insecurity: No Food Insecurity (11/23/2020)   Hunger Vital Sign    Worried About Running Out of Food in the Last Year: Never true    Ran Out of Food in  the Last Year: Never true  Housing: Low Risk  (02/18/2022)   Housing    Last Housing Risk Score: 0  Physical Activity: Insufficiently Active (05/12/2021)   Exercise Vital Sign    Days of Exercise per Week: 3 days    Minutes of Exercise per Session: 20 min  Social Connections: Socially Isolated (05/12/2021)   Social Connection and Isolation Panel [NHANES]    Frequency of Communication with Friends and Family: Once a week    Frequency of Social Gatherings with Friends and Family: Once a week    Attends Religious Services: Never    Marine scientist or Organizations: No    Attends Archivist Meetings: Never    Marital Status: Married  Stress: No Stress Concern Present (05/12/2021)   [employer] [place]    Feeling of Stress : Only a little  Tobacco Use: High Risk (02/18/2022)   Patient History    Smoking Tobacco Use: Some Days    Smokeless Tobacco Use: Never    Passive Exposure: Not on file  Transportation Needs: No Transportation Needs (02/18/2022)   PRAPARE - Transportation    Lack of Transportation (Medical): No    Lack of Transportation (Non-Medical): No    CCM Care Plan  Allergies  Allergen Reactions   Benadryl [Diphenhydramine Hcl]     Caused seizures.    Yellow Jacket Venom [Honey Bee Venom]     Facial swelling after sting   Metformin And Related Diarrhea   Carbamazepine     Inc in seizures   Lamotrigine     Inc in seizures   Phenobarbital     rash    Medications Reviewed Today     Reviewed by Charlton Haws, [facility] (Pharmacist) on 04/10/22 at 1430  Med List Status: <None>   Medication Order Taking? Sig  Documenting Provider Last Dose Status Informant  acetaminophen (TYLENOL) 325 MG tablet [id number] Yes You can take 2 tablets every 6 hours as needed for pain. Use this first then the prescribed pain medication.   DO NOT TAKE MORE THAN 4000 MG OF TYLENOL PER DAY.  IT CAN HARM YOUR LIVER. Earnstine Regal, PA-C Taking Active   atorvastatin (LIPITOR) 20 MG tablet [id number] Yes TAKE 1 TABLET EVERY DAY  Patient taking differently: Take 10 mg by mouth daily.   Tonia Ghent, MD Taking Active   clotrimazole-betamethasone Donalynn Furlong) cream [id number] Yes Apply 1 application topically daily. Tonia Ghent, MD Taking Active   EPINEPHrine 0.3 mg/0.3 mL IJ SOAJ injection [id number] Yes Inject 0.3 mg into the muscle as needed. Owens Loffler, MD Taking Active Self           Med Note Fleet Contras Jul 07, 2019  4:08 PM)    insulin glargine (LANTUS SOLOSTAR) 100 UNIT/ML Solostar Pen [id number] Yes Inject 38 Units into the skin daily. Tonia Ghent, MD Taking Active   latanoprost Ivin Poot) 0.005 % ophthalmic solution [id number] Yes  [provider] Taking Active   levETIRAcetam (KEPPRA) 1000 MG tablet [id number] Yes Take 1 tablet in the AM and 1.5 tablets at bedtime Ward Givens, NP Taking Active   meloxicam (MOBIC) 15 MG tablet [id number] Yes TAKE 1 TABLET EVERY DAY WITH FOOD AS NEEDED FOR PAIN Tonia Ghent, MD Taking Active   nystatin (MYCOSTATIN/NYSTOP) powder [id number] Yes Apply 1 application. topically 2 (two) times daily. Tonia Ghent, MD Taking Active   oxcarbazepine (TRILEPTAL) 600 MG tablet [id number]  Yes Take 1 tablet (600 mg total) by mouth 2 (two) times daily. Ward Givens, NP Taking Active   ramipril (ALTACE) 1.25 MG capsule [id number] Yes TAKE 1 CAPSULE BY MOUTH EVERY DAY Tonia Ghent, MD Taking Active   triamcinolone cream (KENALOG) 0.5 % [id number] Yes Apply 1 application topically 2 (two) times daily as needed (for itchy spots on skin). Tonia Ghent, MD  Taking Active   TRUE METRIX BLOOD GLUCOSE TEST test strip [id number] Yes TEST BLOOD SUGAR ONE TIME DAILY OR AS NEEDED AS DIRECTED Tonia Ghent, MD Taking Active   TRUEPLUS LANCETS 33G [place] [id number] Yes Use as instructed to test blood sugar once daily or as needed.  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 Tonia Ghent, MD Taking Active             Patient Active Problem List   Diagnosis Date Noted   Cough 01/02/2022   Other social stressor 02/27/2021   Hiccups 01/23/2021   Colon cancer screening 04/11/2020   Medicare annual wellness visit, subsequent 11/03/2019   HLD (hyperlipidemia) 11/03/2019   Erectile dysfunction 07/10/2019   Right foot pain 05/01/2019   Advance care planning 10/28/2018   Mood change 10/28/2018   Shoulder pain 10/28/2018   Hyperplasia, fatty tissue 10/28/2018   Health care maintenance 10/26/2018   Partial symptomatic epilepsy with complex partial seizures, not intractable, with status epilepticus ([place]) 09/09/2018   Changes in vision 09/09/2018   Urinary symptom or sign 80/32/1224   Umbilical hernia without obstruction and without gangrene 04/12/2017   Itching 04/12/2017   Cerebral palsy, hemiplegic ([place]) 03/10/2017   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   LLQ pain 01/11/2014   Type 2 diabetes mellitus with other specified complication ([place]) 82/50/0370   Cerebral palsy ([place]) 05/11/2011   Hearing loss 04/17/2011   Rash 04/17/2011   Skin lesion 04/17/2011   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[place]) 03/02/2007   STROKE 03/02/2007    Immunization History  Administered Date(s) Administered   PFIZER(Purple Top)SARS-COV-2 Vaccination 12/03/2019, 01/03/2020, 11/17/2020    Conditions to be addressed/monitored:  Hyperlipidemia, Diabetes, and Epilepsy , Hx stroke  Care Plan : [place]  Updates made by Charlton Haws, [place] since 04/10/2022 12:00 AM     Problem: Hyperlipidemia, Diabetes,  and Epilepsy, Hx stroke   Priority: High     Long-Range Goal: Disease Management   Start Date: 04/03/2021  Expected End Date: 04/11/2023  This Visit's Progress: On track  Recent Progress: On track  Priority: High  Note:   Current Barriers:  Unable to maintain control of DM, HLD  Pharmacist Clinical Goal(s):  Patient will adhere to plan to optimize therapeutic regimen for DM, HLD as evidenced by report of adherence to recommended medication management changes through collaboration with PharmD and provider.   Interventions: 1:1 collaboration with Tonia Ghent, MD regarding development and update of comprehensive plan of care as evidenced by provider attestation and co-signature Inter-disciplinary care team collaboration (see longitudinal plan of care) Comprehensive medication review performed; medication list updated in electronic medical record  Hyperlipidemia: (LDL goal < 70) -Controlled - LDL 99 (12/2021) above goal; TRIG 224 also elevated c/w uncontrolled DM; pt was advised to hold atorvastatin for 2 weeks in April and restart at 1/2 dose -Hx Stroke -Current treatment: Atorvastatin 20 mg daily - 1/2 tab daily -Appropriate, Query Effective -Medications previously tried: none reported  -Reviewed Cholesterol goals; benefits of statin for ASCVD risk reduction -Pt  may require ezetimibe or alternate statin (rosuva) in future to achieve LDL goal -Recommend to continue current medication for now  Diabetes (A1c goal <7.5%) Query controlled  - A1c 8.8% (12/2021); pt reports he stopped taking metformin 2 months ago due to diarrhea, this has improved without metformin; he reports BG is "good", no readings provided -Current home glucose readings - none available -Denies hypoglycemic/hyperglycemic symptoms -Current medications: Lantus Solostar 38 units daily (PAP)-  Appropriate, Query Effective Metformin 500 mg ER BID - not taking -Medications previously tried: GI upset with more than 1000  mg metformin/day, Trulicity (never started due to A1c improvement prior to completion of PAP).  -Recommended to continue current medication; due for repeat A1c/PCP f/u - tried to schedule appt but pt needs to call office back with availability  Epilepsy (Goal: reduce seizure frequency) -Controlled -Follows with neurology (Dr Dohmeier) -Current treatment  Keppra 1000 mg - 1 AM, 1.5 HS - Appropriate, Effective, Safe, Accessible Oxcarbazepine 600 mg BID -Appropriate, Effective, Safe, Accessible -Medications previously tried: phenobarbital, CBMZ, lamotrigine (all ineffective) -Recommended to continue current medication  Patient Goals/Self-Care Activities Patient will:  - take medications as prescribed - check glucose daily, document, and provide at future appointments - engage in dietary modifications by avoiding soda and limiting portion sizes      Medication Assistance:  Lantus - [employer] PAP approved through 09/14/22  Compliance/Adherence/Medication fill history: Care Gaps: Foot exam (due 02/26/22) Colonoscopy (due 06/17/19)  Star-Rating Drugs: Atorvastatin - PDC 93% (LF 12/17/21) Metformin - PDC 0% Ramipril - PDC 100%  Medication Access: Within the past 30 days, how often has patient missed a dose of medication? 0 Is a pillbox or other method used to improve adherence? Yes  Factors that may affect medication adherence? no barriers identified Are meds synced by current pharmacy? No  Are meds delivered by current pharmacy? Yes  Does patient experience delays in picking up medications due to transportation concerns? No   Upstream Services Reviewed: Is patient disadvantaged to use [facility]?: Yes  Current Rx insurance plan: [employer] Name and location of Current pharmacy:  [facility], [address] [place] [address] Phone: [phone] Fax: [phone]  [id number] [place], [address] [place] [address] Phone: [phone] Fax: [phone]  [facility] services reviewed with patient today?: No  Patient requests to transfer care to [facility] Pharmacy?: No  Reason patient declined to change pharmacies: Disadvantaged due to insurance/mail order   Care Plan and Follow Up Patient Decision:  Patient agrees to Care Plan and Follow-up.  Plan: Telephone follow up appointment with care management team member scheduled for:  1 month  Charlene Brooke, PharmD, [facility] Clinical Pharmacist [place] [facility] at [facility] [phone]

## 2022-04-10 NOTE — Patient Instructions (Signed)
Visit Information  Phone number for Pharmacist: [phone]   Goals Addressed   None     Care Plan : [place]  Updates made by Charlton Haws, RPH since 04/10/2022 12:00 AM     Problem: Hyperlipidemia, Diabetes, and Epilepsy, Hx stroke   Priority: High     Long-Range Goal: Disease Management   Start Date: 04/03/2021  Expected End Date: 04/11/2023  This Visit's Progress: On track  Recent Progress: On track  Priority: High  Note:   Current Barriers:  Unable to maintain control of DM, HLD  Pharmacist Clinical Goal(s):  Patient will adhere to plan to optimize therapeutic regimen for DM, HLD as evidenced by report of adherence to recommended medication management changes through collaboration with PharmD and provider.   Interventions: 1:1 collaboration with Tonia Ghent, MD regarding development and update of comprehensive plan of care as evidenced by provider attestation and co-signature Inter-disciplinary care team collaboration (see longitudinal plan of care) Comprehensive medication review performed; medication list updated in electronic medical record  Hyperlipidemia: (LDL goal < 70) -Controlled - LDL 99 (12/2021) above goal; TRIG 224 also elevated c/w uncontrolled DM; pt was advised to hold atorvastatin for 2 weeks in April and restart at 1/2 dose -Hx Stroke -Current treatment: Atorvastatin 20 mg daily - 1/2 tab daily -Appropriate, Query Effective -Medications previously tried: none reported  -Reviewed Cholesterol goals; benefits of statin for ASCVD risk reduction -Pt may require ezetimibe or alternate statin (rosuva) in future to achieve LDL goal -Recommend to continue current medication for now  Diabetes (A1c goal <7.5%) Query controlled  - A1c 8.8% (12/2021); pt reports he stopped taking metformin 2 months ago due to diarrhea, this has improved without metformin; he reports BG is "good", no readings provided -Current home glucose readings - none  available -Denies hypoglycemic/hyperglycemic symptoms -Current medications: Lantus Solostar 38 units daily (PAP)-  Appropriate, Query Effective Metformin 500 mg ER BID - not taking -Medications previously tried: GI upset with more than 1000 mg metformin/day, Trulicity (never started due to A1c improvement prior to completion of PAP).  -Recommended to continue current medication; due for repeat A1c/PCP f/u - tried to schedule appt but pt needs to call office back with availability  Epilepsy (Goal: reduce seizure frequency) -Controlled -Follows with neurology (Dr Dohmeier) -Current treatment  Keppra 1000 mg - 1 AM, 1.5 HS - Appropriate, Effective, Safe, Accessible Oxcarbazepine 600 mg BID -Appropriate, Effective, Safe, Accessible -Medications previously tried: phenobarbital, CBMZ, lamotrigine (all ineffective) -Recommended to continue current medication  Patient Goals/Self-Care Activities Patient will:  - take medications as prescribed - check glucose daily, document, and provide at future appointments - engage in dietary modifications by avoiding soda and limiting portion sizes      Patient verbalizes understanding of instructions and care plan provided today and agrees to view in [place]. Active MyChart status and patient understanding of how to access instructions and care plan via MyChart confirmed with patient.    Telephone follow up appointment with pharmacy team member scheduled for: 2 months  Charlene Brooke, PharmD, [facility] Clinical Pharmacist [place] [facility] at [facility] [phone]

## 2022-04-11 ENCOUNTER — Telehealth: Payer: Self-pay

## 2022-04-11 NOTE — Telephone Encounter (Signed)
Please call and schedule 3 month f/u for patient

## 2022-04-11 NOTE — Telephone Encounter (Signed)
-----   Message from Marijean Niemann, [place] sent at 04/11/2022  1:38 PM EDT ----- Regarding: 3 month FU Hi Bradley Davila, Patient needs to schedule his 3 month follow up, but I am unable to schedule for Dr. Damita Dunnings. Would you mind taking care of this? Thank you so much! Have a great weekend.  Amy

## 2022-04-11 NOTE — Telephone Encounter (Signed)
Spoke with pt stated he would call back to schedule

## 2022-04-14 DIAGNOSIS — Z794 Long term (current) use of insulin: Secondary | ICD-10-CM

## 2022-04-14 DIAGNOSIS — E1169 Type 2 diabetes mellitus with other specified complication: Secondary | ICD-10-CM | POA: Diagnosis not present

## 2022-04-14 DIAGNOSIS — E785 Hyperlipidemia, unspecified: Secondary | ICD-10-CM | POA: Diagnosis not present

## 2022-04-22 ENCOUNTER — Telehealth: Payer: Self-pay | Admitting: Adult Health

## 2022-04-22 MED ORDER — LEVETIRACETAM 1000 MG PO TABS
ORAL_TABLET | ORAL | 0 refills | Status: DC
Start: 2022-04-22 — End: 2022-06-25

## 2022-04-22 MED ORDER — OXCARBAZEPINE 600 MG PO TABS: 600.0000 mg | ORAL_TABLET | Freq: Two times a day (BID) | ORAL | 0 refills | Status: DC

## 2022-04-22 NOTE — Telephone Encounter (Signed)
Pt's wife, Derreon Consalvo request refill for oxcarbazepine (TRILEPTAL) 600 MG tablet and levETIRAcetam (KEPPRA) 1000 MG tablet at [facility]

## 2022-05-05 ENCOUNTER — Ambulatory Visit: Payer: Medicare HMO | Admitting: Neurology

## 2022-05-22 ENCOUNTER — Ambulatory Visit: Payer: Self-pay

## 2022-05-28 ENCOUNTER — Telehealth: Payer: Self-pay

## 2022-05-28 NOTE — Progress Notes (Signed)
    Chronic Care Management Pharmacy Assistant   Name: Bradley Davila  MRN: [id number] DOB: [birth year]  Reason for Encounter: CCM (Appointment Reminder)  Medications: Outpatient Encounter Medications as of 05/28/2022  Medication Sig   acetaminophen (TYLENOL) 325 MG tablet You can take 2 tablets every 6 hours as needed for pain. Use this first then the prescribed pain medication.   DO NOT TAKE MORE THAN 4000 MG OF TYLENOL PER DAY.  IT CAN HARM YOUR LIVER.   atorvastatin (LIPITOR) 20 MG tablet TAKE 1 TABLET EVERY DAY (Patient taking differently: Take 10 mg by mouth daily.)   clotrimazole-betamethasone (LOTRISONE) cream Apply 1 application topically daily.   EPINEPHrine 0.3 mg/0.3 mL IJ SOAJ injection Inject 0.3 mg into the muscle as needed.   insulin glargine (LANTUS SOLOSTAR) 100 UNIT/ML Solostar Pen Inject 38 Units into the skin daily.   latanoprost (XALATAN) 0.005 % ophthalmic solution    levETIRAcetam (KEPPRA) 1000 MG tablet Take 1 tablet in the AM and 1.5 tablets at bedtime   meloxicam (MOBIC) 15 MG tablet TAKE 1 TABLET EVERY DAY WITH FOOD AS NEEDED FOR PAIN   nystatin (MYCOSTATIN/NYSTOP) powder Apply 1 application. topically 2 (two) times daily.   oxcarbazepine (TRILEPTAL) 600 MG tablet Take 1 tablet (600 mg total) by mouth 2 (two) times daily.   ramipril (ALTACE) 1.25 MG capsule TAKE 1 CAPSULE BY MOUTH EVERY DAY   triamcinolone cream (KENALOG) 0.5 % Apply 1 application topically 2 (two) times daily as needed (for itchy spots on skin).   TRUE METRIX BLOOD GLUCOSE TEST test strip TEST BLOOD SUGAR ONE TIME DAILY OR AS NEEDED AS DIRECTED   TRUEPLUS LANCETS 33G MISC Use as instructed to test blood sugar once daily or as needed.  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   No facility-administered encounter medications on file as of 05/28/2022.   Bradley Davila was contacted to remind of upcoming telephone visit with Charlene Brooke on 06/02/2022 at 3:45. Patient was reminded to have any  blood glucose and blood pressure readings available for review at appointment.   Message was left reminding patient of appointment.  CCM referral has been placed prior to visit?  Yes   Star Rating Drugs: Medication:  Last Fill: Day Supply Atorvastatin 20 mg 05/20/2022 90 Ramipril 1.25 mg 03/12/2022 [place], CPP notified  Marijean Niemann, [place] Pharmacy Assistant [phone]

## 2022-06-02 ENCOUNTER — Ambulatory Visit (INDEPENDENT_AMBULATORY_CARE_PROVIDER_SITE_OTHER): Payer: Medicare HMO | Admitting: Pharmacist

## 2022-06-02 DIAGNOSIS — E785 Hyperlipidemia, unspecified: Secondary | ICD-10-CM

## 2022-06-02 DIAGNOSIS — E1169 Type 2 diabetes mellitus with other specified complication: Secondary | ICD-10-CM

## 2022-06-02 MED ORDER — ATORVASTATIN CALCIUM 20 MG PO TABS: 10.0000 mg | ORAL_TABLET | Freq: Every day | ORAL | 3 refills | Status: DC

## 2022-06-02 NOTE — Progress Notes (Signed)
Chronic Care Management Pharmacy Note  06/02/2022 Name:  Bradley Davila MRN:  [id number] DOB:  1961-05-02  Summary: CCM F/U visit -Reviewed medications; pt affirms compliance except with metformin -DM: A1c 8.8% (12/2021), pt was advised to f/u with PCP 3 months later but has not scheduled appt yet -HLD/hx stroke: LDL 99 (12/2021) on atorvastatin 20 mg, pt is now taking 1/2 dose (10 mg) daily, he may require ezetimibe or alternate statin in future to achieve LDL goal < 70  Recommendations/Changes made from today's visit: -Pt is due for PCP F/U (In April PCP advised 3-mo f/u for DM) - pt declined to schedule today, will call back with availability  Plan: -CCM Health Concierge will call patient within 30 days re: scheduling PCP appt -Pharmacist follow up televisit scheduled for 3 months    Subjective: Bradley Davila is an 61 y.o. year old male who is a primary patient of Bradley Davila, Bradley Curd, MD.  The CCM team was consulted for assistance with disease management and care coordination needs.    Engaged with patient by telephone for follow up visit in response to provider referral for pharmacy case management and/or care coordination services.   Consent to Services:  The patient was given information about Chronic Care Management services, agreed to services, and gave verbal consent prior to initiation of services.  Please see initial visit note for detailed documentation.   Patient Care Team: Bradley Nam, MD as PCP - General (Family Medicine) Bradley Crane, MD as Consulting Physician (Ophthalmology) Bradley Davila, [facility] as Pharmacist (Pharmacist)  Recent office visits: 12/30/21 Dr Bradley Davila OV: AWV - hold lipitor x 1-2 weeks, restart at 1/2 tab. F/U 3 months.  Recent consult visits: 01/07/22 Bradley Davila (Neurology) Unspecified convulsions Procedure: EEG Impression: No evidence of interictal epileptiform discharges seen.  01/02/22 Bradley Penny, NP (Neurology) Seizures  Ordered: EEG - increase Keppra to 1000 mg AM and 1500 mg PM.   Hospital visits: None in previous 6 months   Objective:  Lab Results  Component Value Date   CREATININE 1.00 12/30/2021   BUN 15 12/30/2021   GFR 80.87 12/30/2021   EGFR 74 01/02/2021   GFRNONAA 87 01/03/2020   GFRAA 100 01/03/2020   NA 136 12/30/2021   K 4.5 12/30/2021   CALCIUM 9.7 12/30/2021   CO2 29 12/30/2021   GLUCOSE 118 (H) 12/30/2021    Lab Results  Component Value Date/Time   HGBA1C 8.8 (H) 12/30/2021 01:46 PM   HGBA1C 7.5 (A) 05/30/2021 11:03 AM   HGBA1C 10.1 (A) 02/26/2021 11:30 AM   HGBA1C 7.9 (H) 10/31/2019 10:51 AM   GFR 80.87 12/30/2021 01:46 PM   GFR 78.03 10/31/2019 10:51 AM   MICROALBUR 24.2 (H) 10/31/2019 10:51 AM   MICROALBUR 57.8 (H) 10/26/2018 08:38 AM    Last diabetic Eye exam:  Lab Results  Component Value Date/Time   HMDIABEYEEXA No Retinopathy 12/19/2021 12:00 AM    Last diabetic Foot exam: No results found for: "HMDIABFOOTEX"   Lab Results  Component Value Date   CHOL 162 12/30/2021   HDL 41.60 12/30/2021   LDLCALC 69 10/31/2019   LDLDIRECT 99.0 12/30/2021   TRIG 224.0 (H) 12/30/2021   CHOLHDL 4 12/30/2021       Latest Ref Rng & Units 12/30/2021    1:46 PM 01/02/2021   11:50 AM 01/03/2020   11:30 AM  Hepatic Function  Total Protein 6.0 - 8.3 g/dL 7.6  7.7  7.2   Albumin 3.5 - 5.2  g/dL 4.5  4.4  4.5   AST 0 - 37 U/L $Remo'18  19  18   'xAQmM$ ALT 0 - 53 U/L $Remo'26  24  23   'acbyS$ Alk Phosphatase 39 - 117 U/L 88  101  89   Total Bilirubin 0.2 - 1.2 mg/dL 0.4  0.3  <0.2     Lab Results  Component Value Date/Time   TSH 1.14 12/30/2021 01:46 PM   TSH 1.188 10/21/2006 09:14 PM       Latest Ref Rng & Units 12/30/2021    1:46 PM 01/02/2021   11:50 AM 01/03/2020   11:30 AM  CBC  WBC 4.0 - 10.5 K/uL 10.6  9.1  9.9   Hemoglobin 13.0 - 17.0 g/dL 14.9  15.2  15.0   Hematocrit 39.0 - 52.0 % 44.7  44.8  42.6   Platelets 150.0 - 400.0 K/uL 344.0  346  373     No results found for:  "VD25OH"  Clinical ASCVD: Yes  The ASCVD Risk score (Arnett DK, et al., 2019) failed to calculate for the following reasons:   The patient has a prior MI or stroke diagnosis       02/18/2022    9:35 AM 08/21/2021   10:45 AM 05/12/2021    8:52 AM  Depression screen PHQ 2/9  Decreased Interest 0 0 2  Down, Depressed, Hopeless 0 0 2  PHQ - 2 Score 0 0 4  Altered sleeping   0  Tired, decreased energy   0  Change in appetite   0  Feeling bad or failure about yourself    0  Trouble concentrating   0  Moving slowly or fidgety/restless   0  Suicidal thoughts   0  PHQ-9 Score   4  Difficult doing work/chores   Not difficult at all     Social History   Tobacco Use  Smoking Status Some Days   Types: Cigarettes  Smokeless Tobacco Never  Tobacco Comments   Black and mild only    BP Readings from Last 3 Encounters:  01/02/22 124/86  12/30/21 122/78  05/30/21 126/82   Pulse Readings from Last 3 Encounters:  01/02/22 80  12/30/21 82  05/30/21 87   Wt Readings from Last 3 Encounters:  01/02/22 220 lb (99.8 kg)  12/30/21 220 lb (99.8 kg)  05/30/21 217 lb (98.4 kg)   BMI Readings from Last 3 Encounters:  01/02/22 35.51 kg/m  12/30/21 35.51 kg/m  05/30/21 35.02 kg/m    Assessment/Interventions: Review of patient past medical history, allergies, medications, health status, including review of consultants reports, laboratory and other test data, was performed as part of comprehensive evaluation and provision of chronic care management services.   SDOH:  (Social Determinants of Health) assessments and interventions performed: No  - done June 2023 SDOH Interventions    Flowsheet Row Chronic Care Management from 09/30/2021 in [place] at [place] from 05/12/2021 in [place] at [place] Management from 04/03/2021 in [place] at [place] from 10/26/2018 in [place] at [place] from 10/16/2017 in [place] at [place]  SDOH Interventions       Depression Interventions/Treatment  -- ZYY4-8 Score <4 Follow-up Not Indicated -- --  Merilyn Baba to PCP] --  [referral to PCP]  Financial Strain Interventions Other (Comment)  [Renew Lantus PAP 2023] -- Other (Comment)  [Cost assistance program for medications] -- --  Physical Activity Interventions --  Intervention Not Indicated -- -- --  Stress Interventions -- Intervention Not Indicated -- -- --  Social Connections Interventions -- Intervention Not Indicated -- -- --      SDOH Screenings   Food Insecurity: No Food Insecurity (11/23/2020)  Housing: Low Risk  (02/18/2022)  Transportation Needs: No Transportation Needs (02/18/2022)  Alcohol Screen: Low Risk  (05/12/2021)  Depression (PHQ2-9): Low Risk  (02/18/2022)  Financial Resource Strain: High Risk (09/30/2021)  Physical Activity: Insufficiently Active (05/12/2021)  Social Connections: Socially Isolated (05/12/2021)  Stress: No Stress Concern Present (05/12/2021)  Tobacco Use: High Risk (02/18/2022)    CCM Care Plan  Allergies  Allergen Reactions   Benadryl [Diphenhydramine Hcl]     Caused seizures.    Yellow Jacket Venom [Honey Bee Venom]     Facial swelling after sting   Metformin And Related Diarrhea   Carbamazepine     Inc in seizures   Lamotrigine     Inc in seizures   Phenobarbital     rash    Medications Reviewed Today     Reviewed by Charlton Haws, [facility] (Pharmacist) on 04/10/22 at 1430  Med List Status: <None>   Medication Order Taking? Sig Documenting Provider Last Dose Status Informant  acetaminophen (TYLENOL) 325 MG tablet [id number] Yes You can take 2 tablets every 6 hours as needed for pain. Use this first then the prescribed pain medication.   DO NOT TAKE MORE THAN 4000 MG OF TYLENOL PER DAY.  IT CAN HARM YOUR LIVER. Earnstine Regal, PA-C Taking Active   atorvastatin (LIPITOR) 20 MG tablet [id number] Yes TAKE 1 TABLET EVERY  DAY  Patient taking differently: Take 10 mg by mouth daily.   Tonia Ghent, MD Taking Active   clotrimazole-betamethasone Donalynn Furlong) cream [id number] Yes Apply 1 application topically daily. Tonia Ghent, MD Taking Active   EPINEPHrine 0.3 mg/0.3 mL IJ SOAJ injection [id number] Yes Inject 0.3 mg into the muscle as needed. Owens Loffler, MD Taking Active Self           Med Note Fleet Contras Jul 07, 2019  4:08 PM)    insulin glargine (LANTUS SOLOSTAR) 100 UNIT/ML Solostar Pen [id number] Yes Inject 38 Units into the skin daily. Tonia Ghent, MD Taking Active   latanoprost Ivin Poot) 0.005 % ophthalmic solution [id number] Yes  [provider] Taking Active   levETIRAcetam (KEPPRA) 1000 MG tablet [id number] Yes Take 1 tablet in the AM and 1.5 tablets at bedtime Ward Givens, NP Taking Active   meloxicam (MOBIC) 15 MG tablet [id number] Yes TAKE 1 TABLET EVERY DAY WITH FOOD AS NEEDED FOR PAIN Tonia Ghent, MD Taking Active   nystatin (MYCOSTATIN/NYSTOP) powder [id number] Yes Apply 1 application. topically 2 (two) times daily. Tonia Ghent, MD Taking Active   oxcarbazepine (TRILEPTAL) 600 MG tablet [id number] Yes Take 1 tablet (600 mg total) by mouth 2 (two) times daily. Ward Givens, NP Taking Active   ramipril (ALTACE) 1.25 MG capsule [id number] Yes TAKE 1 CAPSULE BY MOUTH EVERY DAY Tonia Ghent, MD Taking Active   triamcinolone cream (KENALOG) 0.5 % [id number] Yes Apply 1 application topically 2 (two) times daily as needed (for itchy spots on skin). Tonia Ghent, MD Taking Active   TRUE METRIX BLOOD GLUCOSE TEST test strip [id number] Yes TEST BLOOD SUGAR ONE TIME DAILY OR AS NEEDED AS DIRECTED Tonia Ghent, MD Taking Active   TRUEPLUS LANCETS 33G [place] [id number] Yes Use as instructed to test blood sugar once  daily or as needed.  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 Tonia Ghent, MD Taking Active             Patient Active Problem List    Diagnosis Date Noted   Cough 01/02/2022   Other social stressor 02/27/2021   Hiccups 01/23/2021   Colon cancer screening 04/11/2020   Medicare annual wellness visit, subsequent 11/03/2019   HLD (hyperlipidemia) 11/03/2019   Erectile dysfunction 07/10/2019   Right foot pain 05/01/2019   Advance care planning 10/28/2018   Mood change 10/28/2018   Shoulder pain 10/28/2018   Hyperplasia, fatty tissue 10/28/2018   Health care maintenance 10/26/2018   Partial symptomatic epilepsy with complex partial seizures, not intractable, with status epilepticus ([place]) 09/09/2018   Changes in vision 09/09/2018   Urinary symptom or sign 44/96/7591   Umbilical hernia without obstruction and without gangrene 04/12/2017   Itching 04/12/2017   Cerebral palsy, hemiplegic ([place]) 03/10/2017   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/10/2017   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   LLQ pain 01/11/2014   Type 2 diabetes mellitus with other specified complication ([place]) 63/84/6659   Cerebral palsy ([place]) 05/11/2011   Hearing loss 04/17/2011   Rash 04/17/2011   Skin lesion 04/17/2011   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[place]) 03/02/2007   STROKE 03/02/2007    Immunization History  Administered Date(s) Administered   PFIZER(Purple Top)SARS-COV-2 Vaccination 12/03/2019, 01/03/2020, 11/17/2020    Conditions to be addressed/monitored:  Hyperlipidemia, Diabetes, and Epilepsy , Hx stroke  Care Plan : [place]  Updates made by Charlton Haws, [place] since 06/02/2022 12:00 AM     Problem: Hyperlipidemia, Diabetes, and Epilepsy, Hx stroke   Priority: High     Long-Range Goal: Disease Management   Start Date: 04/03/2021  Expected End Date: 04/11/2023  Recent Progress: On track  Priority: High  Note:   Current Barriers:  Unable to maintain control of DM, HLD  Pharmacist Clinical Goal(s):  Patient will adhere to plan to optimize therapeutic regimen for DM, HLD as evidenced by report of adherence to  recommended medication management changes through collaboration with PharmD and provider.   Interventions: 1:1 collaboration with Tonia Ghent, MD regarding development and update of comprehensive plan of care as evidenced by provider attestation and co-signature Inter-disciplinary care team collaboration (see longitudinal plan of care) Comprehensive medication review performed; medication list updated in electronic medical record  Hypertension (BP goal <130/80) -Controlled -Current home readings: n/a -Current treatment: Ramipril 1.25 mg daily - Appropriate, Effective, Safe, Accessible -Medications previously tried: lisinopril  -Denies hypotensive/hypertensive symptoms -Educated on BP goals and benefits of medications for prevention of heart attack, stroke and kidney damage; -Counseled to monitor BP at home periodically -Recommended to continue current medication  Hyperlipidemia: (LDL goal < 70) -Controlled - LDL 99 (12/2021) above goal; TRIG 224 also elevated c/w uncontrolled DM; pt was advised to 1/2 dose in April -Hx Stroke -Current treatment: Atorvastatin 20 mg daily - 1/2 tab daily -Appropriate, Query Effective -Medications previously tried: none reported  -Reviewed Cholesterol goals; benefits of statin for ASCVD risk reduction -Pt may require ezetimibe or alternate statin (rosuva) in future to achieve LDL goal -Recommend to continue current medication for now  Diabetes (A1c goal <7.5%) -Query controlled  - A1c 8.8% (12/2021), peak 10.1% (02/2021); pt reports he stopped taking metformin in May due to diarrhea, this has improved without metformin; he reports BG is "up and down" -Current home glucose readings: 150-160 in AM -Denies hypoglycemic/hyperglycemic symptoms -Current medications:  Lantus Solostar 38 units daily (PAP)-  Appropriate, Query Effective -Medications previously tried: metformin (diarrhea); Trulicity (never started due to A1c improvement prior to completion of  PAP).  -Recommended to continue current medication; due for repeat A1c/PCP f/u - tried to schedule appt but pt needs to call office back with availability  Epilepsy (Goal: reduce seizure frequency) -Controlled -Follows with neurology (Dr Dohmeier); hx cerebral palsy as well -Current treatment  Keppra 1000 mg - 1 AM, 1.5 HS - Appropriate, Effective, Safe, Accessible Oxcarbazepine 600 mg BID -Appropriate, Effective, Safe, Accessible -Medications previously tried: phenobarbital, CBMZ, lamotrigine (all ineffective) -Recommended to continue current medication  Patient Goals/Self-Care Activities Patient will:  - take medications as prescribed - check glucose daily, document, and provide at future appointments - engage in dietary modifications by avoiding soda and limiting portion sizes       Medication Assistance:  Lantus - [employer] PAP approved through 09/14/22  Compliance/Adherence/Medication fill history: Care Gaps: Foot exam (due 02/26/22) Colonoscopy (due 06/17/19)  Star-Rating Drugs: Atorvastatin - PDC 93% (LF 12/17/21) Metformin - PDC 0% Ramipril - PDC 100%  Medication Access: Within the past 30 days, how often has patient missed a dose of medication? 0 Is a pillbox or other method used to improve adherence? Yes  Factors that may affect medication adherence? no barriers identified Are meds synced by current pharmacy? No  Are meds delivered by current pharmacy? Yes  Does patient experience delays in picking up medications due to transportation concerns? No   Upstream Services Reviewed: Is patient disadvantaged to use [facility]?: Yes  Current Rx insurance plan: [employer] Name and location of Current pharmacy:  [address], [address] [place] [address] Phone: [phone] Fax: [phone]  [id number] [place], [address] [place] [address] Phone:  [phone] Fax: [phone]  [facility] services reviewed with patient today?: No  Patient requests to transfer care to [facility] Pharmacy?: No  Reason patient declined to change pharmacies: Disadvantaged due to insurance/mail order   Care Plan and Follow Up Patient Decision:  Patient agrees to Care Plan and Follow-up.  Plan: Telephone follow up appointment with care management team member scheduled for:  3 months  Charlene Brooke, PharmD, BCACP Clinical Pharmacist [facility] at [facility] [phone]

## 2022-06-02 NOTE — Patient Instructions (Signed)
Visit Information  Phone number for Pharmacist: [phone]   Goals Addressed   None     Care Plan : [place]  Updates made by Charlton Haws, RPH since 06/02/2022 12:00 AM     Problem: Hyperlipidemia, Diabetes, and Epilepsy, Hx stroke   Priority: High     Long-Range Goal: Disease Management   Start Date: 04/03/2021  Expected End Date: 04/11/2023  Recent Progress: On track  Priority: High  Note:   Current Barriers:  Unable to maintain control of DM, HLD  Pharmacist Clinical Goal(s):  Patient will adhere to plan to optimize therapeutic regimen for DM, HLD as evidenced by report of adherence to recommended medication management changes through collaboration with PharmD and provider.   Interventions: 1:1 collaboration with Tonia Ghent, MD regarding development and update of comprehensive plan of care as evidenced by provider attestation and co-signature Inter-disciplinary care team collaboration (see longitudinal plan of care) Comprehensive medication review performed; medication list updated in electronic medical record  Hypertension (BP goal <130/80) -Controlled -Current home readings: n/a -Current treatment: Ramipril 1.25 mg daily - Appropriate, Effective, Safe, Accessible -Medications previously tried: lisinopril  -Denies hypotensive/hypertensive symptoms -Educated on BP goals and benefits of medications for prevention of heart attack, stroke and kidney damage; -Counseled to monitor BP at home periodically -Recommended to continue current medication  Hyperlipidemia: (LDL goal < 70) -Controlled - LDL 99 (12/2021) above goal; TRIG 224 also elevated c/w uncontrolled DM; pt was advised to 1/2 dose in April -Hx Stroke -Current treatment: Atorvastatin 20 mg daily - 1/2 tab daily -Appropriate, Query Effective -Medications previously tried: none reported  -Reviewed Cholesterol goals; benefits of statin for ASCVD risk reduction -Pt may require  ezetimibe or alternate statin (rosuva) in future to achieve LDL goal -Recommend to continue current medication for now  Diabetes (A1c goal <7.5%) -Query controlled  - A1c 8.8% (12/2021), peak 10.1% (02/2021); pt reports he stopped taking metformin in May due to diarrhea, this has improved without metformin; he reports BG is "up and down" -Current home glucose readings: 150-160 in AM -Denies hypoglycemic/hyperglycemic symptoms -Current medications: Lantus Solostar 38 units daily (PAP)-  Appropriate, Query Effective -Medications previously tried: metformin (diarrhea); Trulicity (never started due to A1c improvement prior to completion of PAP).  -Recommended to continue current medication; due for repeat A1c/PCP f/u - tried to schedule appt but pt needs to call office back with availability  Epilepsy (Goal: reduce seizure frequency) -Controlled -Follows with neurology (Dr Dohmeier); hx cerebral palsy as well -Current treatment  Keppra 1000 mg - 1 AM, 1.5 HS - Appropriate, Effective, Safe, Accessible Oxcarbazepine 600 mg BID -Appropriate, Effective, Safe, Accessible -Medications previously tried: phenobarbital, CBMZ, lamotrigine (all ineffective) -Recommended to continue current medication  Patient Goals/Self-Care Activities Patient will:  - take medications as prescribed - check glucose daily, document, and provide at future appointments - engage in dietary modifications by avoiding soda and limiting portion sizes      Patient verbalizes understanding of instructions and care plan provided today and agrees to view in [place]. Active MyChart status and patient understanding of how to access instructions and care plan via MyChart confirmed with patient.    Telephone follow up appointment with pharmacy team member scheduled for: 3 months  Charlene Brooke, PharmD, [facility] Clinical Pharmacist [facility] at [facility] [phone]

## 2022-06-14 DIAGNOSIS — E1169 Type 2 diabetes mellitus with other specified complication: Secondary | ICD-10-CM

## 2022-06-14 DIAGNOSIS — E785 Hyperlipidemia, unspecified: Secondary | ICD-10-CM | POA: Diagnosis not present

## 2022-06-14 DIAGNOSIS — I10 Essential (primary) hypertension: Secondary | ICD-10-CM | POA: Diagnosis not present

## 2022-06-14 DIAGNOSIS — Z794 Long term (current) use of insulin: Secondary | ICD-10-CM

## 2022-06-17 ENCOUNTER — Ambulatory Visit (INDEPENDENT_AMBULATORY_CARE_PROVIDER_SITE_OTHER): Payer: Medicare HMO | Admitting: Family Medicine

## 2022-06-17 ENCOUNTER — Encounter: Payer: Self-pay | Admitting: Family Medicine

## 2022-06-17 VITALS — BP 120/70 | HR 89 | Temp 97.5°F | Ht 66.0 in | Wt 217.0 lb

## 2022-06-17 DIAGNOSIS — E785 Hyperlipidemia, unspecified: Secondary | ICD-10-CM | POA: Diagnosis not present

## 2022-06-17 DIAGNOSIS — R21 Rash and other nonspecific skin eruption: Secondary | ICD-10-CM

## 2022-06-17 DIAGNOSIS — M7650 Patellar tendinitis, unspecified knee: Secondary | ICD-10-CM | POA: Diagnosis not present

## 2022-06-17 DIAGNOSIS — E1169 Type 2 diabetes mellitus with other specified complication: Secondary | ICD-10-CM

## 2022-06-17 DIAGNOSIS — Z794 Long term (current) use of insulin: Secondary | ICD-10-CM | POA: Diagnosis not present

## 2022-06-17 MED ORDER — ATORVASTATIN CALCIUM 20 MG PO TABS: 20.0000 mg | ORAL_TABLET | Freq: Every day | ORAL | Status: DC

## 2022-06-17 MED ORDER — RAMIPRIL 1.25 MG PO CAPS: ORAL_CAPSULE | ORAL | 3 refills | Status: DC

## 2022-06-17 NOTE — Patient Instructions (Addendum)
Go to the lab on the way out.   If you have mychart we'll likely use that to update you.    Take care.  Glad to see you. Stop the atorvastatin for 2 weeks and see if the aches get better.  Try icing your right knee for about 5 minutes at a time.  Update me as needed.

## 2022-06-17 NOTE — Progress Notes (Signed)
Diabetes:  Using medications without difficulties:38-40 units insulin a day.   Hypoglycemic episodes:no Hyperglycemic episodes:no Feet problems: no tingling.   Blood Sugars averaging: usually 100-150 eye exam within last year: yes GI sx resolved off metformin Intentional weight loss d/w pt.   He had an episode last week where didn't feel but but he wasn't lightheaded and his sugar wasn't low, was in the 160s.  No SZ activity.  No similar symptoms in the meantime.  His hand rash is better with vaseline.    He had been taking '20mg'$  atorvastatin since it was too small to break.  He has variable amount of cramping, R>L shin.  He stopped atorvastatin for 2 weeks w/o change in sx.  D/w pt about retrial off med.    He said some tenderness at the right tibial tubercle without trauma or bruising.  Meds, vitals, and allergies reviewed.   ROS: Per HPI unless specifically indicated in ROS section   GEN: nad, alert and oriented, speech at baseline. HEENT: ncat NECK: supple w/o LA CV: rrr. PULM: ctab, no inc wob ABD: soft, +bs EXT: no edema SKIN: no acute rash but chronic patches of rash on the palms bilaterally. Right tibial tubercle area as location he points to when he is describing his knee pain.  He does not have pain along the joint line.  No bruising or puffiness currently.  No rash.  No erythema.  Diabetic foot exam: Normal inspection No skin breakdown No calluses  Normal DP pulses Normal sensation to light touch and monofilament on R foot, dec but not absent sensation on the L foot to monofilament testing.   Nails normal

## 2022-06-18 LAB — LIPID PANEL
Cholesterol: 161 mg/dL (ref 0–200)
HDL: 43.7 mg/dL (ref >39.00)
LDL Cholesterol: 96 mg/dL (ref 0–99)
NonHDL: 117.7
Total CHOL/HDL Ratio: 4
Triglycerides: 109 mg/dL (ref 0.0–149.0)
VLDL: 21.8 mg/dL (ref 0.0–40.0)

## 2022-06-18 LAB — HEMOGLOBIN A1C: Hgb A1c MFr Bld: 8.7 % — ABNORMAL HIGH (ref 4.6–6.5)

## 2022-06-18 NOTE — Assessment & Plan Note (Signed)
Continue use of Vaseline as needed.

## 2022-06-18 NOTE — Assessment & Plan Note (Signed)
No change in meds at this point.  See notes on labs.  He has a history of intentional weight loss related to diet. For his effort.  His GI symptoms resolved off metformin.  Continue insulin as is.

## 2022-06-18 NOTE — Assessment & Plan Note (Signed)
Discussed icing and potentially using a jumpers knee strap.  Update me as needed.

## 2022-06-18 NOTE — Assessment & Plan Note (Signed)
He will trial off atorvastatin and see if he feels better in the meantime.  I asked him to update me about how he felt.

## 2022-06-25 ENCOUNTER — Encounter: Payer: Self-pay | Admitting: Neurology

## 2022-06-25 ENCOUNTER — Ambulatory Visit: Payer: Medicare HMO | Admitting: Neurology

## 2022-06-25 VITALS — BP 135/78 | HR 77 | Ht 66.0 in | Wt 215.0 lb

## 2022-06-25 DIAGNOSIS — G808 Other cerebral palsy: Secondary | ICD-10-CM

## 2022-06-25 DIAGNOSIS — E1169 Type 2 diabetes mellitus with other specified complication: Secondary | ICD-10-CM | POA: Diagnosis not present

## 2022-06-25 DIAGNOSIS — R569 Unspecified convulsions: Secondary | ICD-10-CM | POA: Diagnosis not present

## 2022-06-25 DIAGNOSIS — G40309 Generalized idiopathic epilepsy and epileptic syndromes, not intractable, without status epilepticus: Secondary | ICD-10-CM

## 2022-06-25 DIAGNOSIS — L403 Pustulosis palmaris et plantaris: Secondary | ICD-10-CM

## 2022-06-25 MED ORDER — LEVETIRACETAM 1000 MG PO TABS: ORAL_TABLET | ORAL | 0 refills | Status: DC

## 2022-06-25 MED ORDER — OXCARBAZEPINE 600 MG PO TABS: 600.0000 mg | ORAL_TABLET | Freq: Two times a day (BID) | ORAL | 0 refills | Status: DC

## 2022-06-25 NOTE — Progress Notes (Signed)
Provider:  Larey Seat, MD   Referring Provider: Tonia Ghent, MD Primary Care Physician:  Tonia Ghent, MD    Chief Complaint  Patient presents with   Follow-up    Pt with wife, rm 46. Presents today for follow up for seizures. He has not had any grand mal seizures since 2014 but has small episodes that can occur with sugar fluctuating or fluorescent lights. Needs refills on 2 meds    09-09-2018: RV doing well, no hospitalization, last seizure 5 years ago ( the last grand mal ) , speech seems to have deteriorated. Medication refills needed.    HPI:  Bradley Davila is a 61 y.o. male and seen here as a RV on 06-25-2022, he was seen last by NP at [facility].  History of cerebral a palsy, DM, psoriais, and seizures,. Presents today for follow up for seizures. He has not had any grand mal seizures since 2014 but has small episodes that can occur with blood sugar fluctuating or exposure to strobe / fluorescent lights.  Presents today for follow up for seizures. He has not had any grand mal seizures since 2014  Needs refills on 2 meds, Keppra and Oxcabazepine. Seizure medication levels were not drawn last week when he had CMET, CBC done, HB Aic high.  He may be a candidate for injection medication.  He got COVID 05-2022, milder sinus head cold symptom. Vaccinated and boosted.      Initial referral/ revisit  from Dr. Damita Dunnings for a transfer of care. I have seen Mr. 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 He has a glossopharyngeal weakness . His paralysis also manifested as right-sided body weakness. In addition, he has diabetes and has developed depression, he underwent a cholecystectomy while still being my patient in 2007.  In the meantime he had been followed by my former Environmental manager O'Donovan at [facility], but would like to return to the general neurology practice here for reasons of  proximity as well as easier emergency care. Dr. Ginny Forth noted that the patient's seizure disorder has been very well controlled and has actually just refilled medications for the last years without any additional diagnostic studies being undertaken. For this reason I will continue his current medications.   I will try to obtain his paper records form years ago- if they still exist.   Review of Systems: Out of a complete 14 system review, the patient complains of only the following symptoms, and all other reviewed systems are negative.   Dysarthria , dysphonia.   Last seizure witnessed 06/15/2022- 'zoning out" not convulsive,  last convulsive seizure 07/02/2013.   Fatigue, high blood pressure.   Late to bed, 11-midnight  sleeping in until 8-9 AM . Nocturia and nocturnal itching waking him up 15 times a night " How likely are you to doze in the following situations: 0 = not likely, 1 = slight chance, 2 = moderate chance, 3 = high chance  Sitting and Reading?0 Watching Television? 1 Sitting inactive in a public place (theater or meeting)?0 Lying down in the afternoon when circumstances permit?2 Sitting and talking to someone?0 Sitting quietly after lunch without alcohol?0 In a car, while stopped for a few minutes in traffic?0 As a passenger in a car for an hour without a break?1  Total =    12-2021; EEG Impression: This is a normal EEG recording in  the waking and drowsy state. No evidence of interictal epileptiform discharges seen. A normal EEG does not exclude a diagnosis of epilepsy.    Alric Ran, MD [facility]  Social History   Socioeconomic History   Marital status: Married    Spouse name: Clarene Critchley   Number of children: 1   Years of education: Not on file   Highest education level: Not on file  Occupational History    Employer: NOT EMPLOYED  Tobacco Use   Smoking status: Some Days    Types: Cigarettes   Smokeless tobacco: Never   Tobacco  comments:    Black and mild only   Vaping Use   Vaping Use: Never used  Substance and Sexual Activity   Alcohol use: Yes    Comment: rarely - 4 to 5 times per year   Drug use: No   Sexual activity: Yes  Other Topics Concern   Not on file  Social History Narrative   Married 2005, 1 daughter Disabled due to cva      Caffeine: soda, all day long    Social Determinants of Health   Financial Resource Strain: High Risk (09/30/2021)   Overall Financial Resource Strain (CARDIA)    Difficulty of Paying Living Expenses: Hard  Food Insecurity: No Food Insecurity (11/23/2020)   Hunger Vital Sign    Worried About Running Out of Food in the Last Year: Never true    Ran Out of Food in the Last Year: Never true  Transportation Needs: No Transportation Needs (02/18/2022)   PRAPARE - Hydrologist (Medical): No    Lack of Transportation (Non-Medical): No  Physical Activity: Insufficiently Active (05/12/2021)   Exercise Vital Sign    Days of Exercise per Week: 3 days    Minutes of Exercise per Session: 20 min  Stress: No Stress Concern Present (05/12/2021)   [place]    Feeling of Stress : Only a little  Social Connections: Socially Isolated (05/12/2021)   Social Connection and Isolation Panel [NHANES]    Frequency of Communication with Friends and Family: Once a week    Frequency of Social Gatherings with Friends and Family: Once a week    Attends Religious Services: Never    Marine scientist or Organizations: No    Attends Archivist Meetings: Never    Marital Status: Married  Human resources officer Violence: Not At Risk (05/12/2021)   Humiliation, Afraid, Rape, and Kick questionnaire    Fear of Current or Ex-Partner: No    Emotionally Abused: No    Physically Abused: No    Sexually Abused: No    Family History  Problem Relation Age of Onset   Hydrocephalus Mother    Liver cancer  Father    Alcohol abuse Father    Alcohol abuse Sister    Heart disease Brother    Colon cancer Neg Hx    Prostate cancer Neg Hx     Past Medical History:  Diagnosis Date   Complication of anesthesia    slow to wake up from anesthesia after gall bladder surgery   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, noted in childhood when developmental milestones weren't met   Diabetes mellitus without complication ([place])    Dysphagia as late effect of stroke    Headache(784.0)    Hypertension    Seizures ([place])    h/o grand mal and staring episodes, followed by  [facility] neuro, last grand mal July 09, 2013 last seizure   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    Stroke [facility])    childhood    Past Surgical History:  Procedure Laterality Date   CHOLECYSTECTOMY  2007   CYSTECTOMY     on buttock, s/p removal   HERNIA REPAIR     INSERTION OF MESH N/A 08/05/2017   Procedure: INSERTION OF MESH;  Surgeon: Coralie Keens, MD;  Location: [facility];  Service: General;  Laterality: N/A;   TONSILLECTOMY     UMBILICAL HERNIA REPAIR  07/37/1062   UMBILICAL HERNIA REPAIR N/A 08/05/2017   Procedure: UMBILICAL HERNIA REPAIR;  Surgeon: Coralie Keens, MD;  Location: [facility];  Service: General;  Laterality: N/A;   WISDOM TOOTH EXTRACTION      Current Outpatient Medications  Medication Sig Dispense Refill   acetaminophen (TYLENOL) 325 MG tablet You can take 2 tablets every 6 hours as needed for pain. Use this first then the prescribed pain medication.   DO NOT TAKE MORE THAN 4000 MG OF TYLENOL PER DAY.  IT CAN HARM YOUR LIVER.     atorvastatin (LIPITOR) 20 MG tablet Take 1 tablet (20 mg total) by mouth daily.     clotrimazole-betamethasone (LOTRISONE) cream Apply 1 application topically daily. 30 g 2   EPINEPHrine 0.3 mg/0.3 mL IJ SOAJ injection Inject 0.3 mg into the muscle as needed.     insulin glargine (LANTUS SOLOSTAR) 100 UNIT/ML Solostar Pen Inject 38 Units into the skin daily. 15 mL     latanoprost (XALATAN) 0.005 % ophthalmic solution      levETIRAcetam (KEPPRA) 1000 MG tablet Take 1 tablet in the AM and 1.5 tablets at bedtime 225 tablet 0   meloxicam (MOBIC) 15 MG tablet TAKE 1 TABLET EVERY DAY WITH FOOD AS NEEDED FOR PAIN 90 tablet 3   nystatin (MYCOSTATIN/NYSTOP) powder Apply 1 application. topically 2 (two) times daily. 60 g 0   oxcarbazepine (TRILEPTAL) 600 MG tablet Take 1 tablet (600 mg total) by mouth 2 (two) times daily. 180 tablet 0   ramipril (ALTACE) 1.25 MG capsule TAKE 1 CAPSULE BY MOUTH EVERY DAY 90 capsule 3   triamcinolone cream (KENALOG) 0.5 % Apply 1 application topically 2 (two) times daily as needed (for itchy spots on skin). 30 g 2   TRUE METRIX BLOOD GLUCOSE TEST test strip TEST BLOOD SUGAR ONE TIME DAILY OR AS NEEDED AS DIRECTED 100 strip 12   TRUEPLUS LANCETS 33G MISC Use as instructed to test blood sugar once daily or as needed.  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 100 each 3   No current facility-administered medications for this visit.    Allergies as of 06/25/2022 - Review Complete 06/25/2022  Allergen Reaction Noted   Benadryl [diphenhydramine hcl]  04/17/2011   Yellow jacket venom [honey bee venom]  04/17/2011   Metformin and related Diarrhea 04/01/2022   Carbamazepine  05/11/2011   Lamotrigine  05/11/2011   Phenobarbital  05/11/2011   Wasp venom protein  06/17/2022   lisinopril for proteinuria / blood pressure 2.5 mg daily , metformin 1 tablet in the morning 2 tablets at night,  meloxicam 15 mg once a day for pain, only taken with food.   Vitals: BP 135/78   Pulse 77   Ht _0  (1.676 m)   Wt 215 lb (97.5 kg)   BMI 34.70 kg/m  Last Weight:  Wt Readings from Last 1 Encounters:  06/25/22 215 lb (97.5  kg)   Last Height:   Ht Readings from Last 1 Encounters:  06/25/22 _0  (1.676 m)    Physical exam:  General: The patient is awake, alert and appears not in acute distress. The patient is cleanly dressed. . Head:  Normocephalic, atraumatic. Neck is supple. Mallampati 3 plus, neck circumference: 17.5"   Facial hair . Cardiovascular:  Regular rate and rhythm , without  murmurs or carotid bruit, and without distended neck veins. Respiratory: Lungs are clear to auscultation. Skin:  Without evidence of edema, or rash Trunk: BMI is severely elevated -the patient has a leaning posture. Right sided weakness.    Neurologic exam : The patient is awake and alert, oriented to place and time.  Memory subjective  described as intact. There is a normal attention span & concentration ability. Speech is fluent with dysarthria, dysphonia but not aphasia. Mood and affect are appropriate.  Cranial nerves: Pupils are equal and briskly reactive to light. Extraocular movements ; the patient's eyes do not track movement in a coordinated fashion, he does have some nystagmus horizontally little eye bobbing with gaze to the right, up toward and downward gaze however seem to be coordinated and conjugate. Visual fields by finger perimetry are intact. Hearing to finger rub intact.  Facial sensation intact to fine touch. Facial motor weakness on the left  tongue and uvula move midline. Tongue protrusion into either cheek is devaited to the right . Shoulder shrug is normal.   Motor exam: Mr. Gwenlyn Found has a increased muscle tone over the hemiparetic right body side, which would be his dominant side.  Sensory:  Fine touch, pinprick and vibration were affected throughout the right body.   Coordination: Rapid alternating movements in the fingers/hands were normal. Finger-to-nose maneuver  normal without evidence of ataxia, dysmetria or tremor.  Gait and station: Patient walks without assistance.  He stumbles easily , right sided weakness.   Deep tendon reflexes: in the upper and lower extremities are spastic hemiparetic.   Assessment:  After physical and neurologic examination, review of laboratory studies, imaging, neurophysiology  testing and pre-existing records, assessment is that of :  1)Dysphonia, dysarthria and a tendency to drool affects the left facial side and  mild spasticity affects right body side. This is static, unchanged for years.   2) Epilepsy remains well controlled on the current medication regimen which I will continue epilepsy is also thought to be a sequela of cerebral palsy.   The patient takes oxcarbazepine 600 mg twice daily Keppra generic levetiracetam 1000 mg 1 tablet in the morning and 1.5 tablets in the evening.  3)  headaches related to HTN and blood sugar control issues.      Plan:  Treatment plan and additional workup :  Keppra and Oxcarbazepine were refilled. Levels were ordered    Larey Seat MD 06/25/2022

## 2022-06-28 LAB — LEVETIRACETAM LEVEL: Levetiracetam Lvl: 24.6 ug/mL (ref 10.0–40.0)

## 2022-06-28 LAB — 10-HYDROXYCARBAZEPINE: Oxcarbazepine SerPl-Mcnc: 22 ug/mL (ref 10–35)

## 2022-06-30 ENCOUNTER — Encounter: Payer: Self-pay | Admitting: *Deleted

## 2022-07-01 NOTE — Telephone Encounter (Signed)
Called and spoke w/ pt about lab results per Dr. Edwena Felty note. Pt verbalized understanding.

## 2022-07-13 ENCOUNTER — Encounter: Payer: Self-pay | Admitting: Family Medicine

## 2022-09-11 DIAGNOSIS — H401131 Primary open-angle glaucoma, bilateral, mild stage: Secondary | ICD-10-CM | POA: Diagnosis not present

## 2022-11-24 ENCOUNTER — Ambulatory Visit (INDEPENDENT_AMBULATORY_CARE_PROVIDER_SITE_OTHER): Payer: Medicare HMO | Admitting: Family Medicine

## 2022-11-24 ENCOUNTER — Encounter: Payer: Self-pay | Admitting: Family Medicine

## 2022-11-24 VITALS — BP 128/82 | HR 78 | Temp 97.8°F | Ht 66.0 in | Wt 223.2 lb

## 2022-11-24 DIAGNOSIS — E1169 Type 2 diabetes mellitus with other specified complication: Secondary | ICD-10-CM | POA: Diagnosis not present

## 2022-11-24 DIAGNOSIS — M25512 Pain in left shoulder: Secondary | ICD-10-CM | POA: Diagnosis not present

## 2022-11-24 DIAGNOSIS — Z794 Long term (current) use of insulin: Secondary | ICD-10-CM | POA: Diagnosis not present

## 2022-11-24 LAB — POCT GLYCOSYLATED HEMOGLOBIN (HGB A1C): Hemoglobin A1C: 8.9 % — AB (ref 4.0–5.6)

## 2022-11-24 MED ORDER — SITAGLIPTIN PHOSPHATE 50 MG PO TABS: 50.0000 mg | ORAL_TABLET | Freq: Every day | ORAL | 1 refills | Status: DC

## 2022-11-24 MED ORDER — TRUE METRIX BLOOD GLUCOSE TEST VI STRP: ORAL_STRIP | 12 refills | Status: DC

## 2022-11-24 MED ORDER — LANTUS SOLOSTAR 100 UNIT/ML ~~LOC~~ SOPN: 45.0000 [IU] | PEN_INJECTOR | Freq: Every day | SUBCUTANEOUS | 3 refills | Status: DC

## 2022-11-24 MED ORDER — LEVETIRACETAM 1000 MG PO TABS: ORAL_TABLET | ORAL | 0 refills | Status: DC

## 2022-11-24 NOTE — Patient Instructions (Addendum)
Let me know if you don't get a call about seeing ortho.  Let me know if [place] isn't affordable or tolerated.  Plan on recheck in about 3-4 months at a yearly visit.  Labs at or ahead of time.  Take care.  Glad to see you.

## 2022-11-24 NOTE — Progress Notes (Signed)
Diabetes:  Using medications without difficulties:yes Hypoglycemic episodes: no Hyperglycemic episodes: no Feet problems: no Blood Sugars averaging: 98-160, usually 100-120s depending on diet.   eye exam within last year: yes A1c d/w pt.  45 units, had to gradually inc his dose.   He had some extra novolog and used that occ with sliding scale.   Discussed adding on januvia.   Shoulder pain, left.  No pain sleeping on L side.  Squeezing pain, along the L trap, L pec, L arm.  More pain with weather change.  Not exertional, can happen at rest.  Noted since chopping wood.    He had fatigue with higher dose of keppra and had to dec to 1000mg  BID.  Still on trileptal 600mg  BID.  Med list updated.    Meds, vitals, and allergies reviewed.   ROS: Per HPI unless specifically indicated in ROS section   GEN: nad, alert and oriented HEENT: ncat, MMM NECK: supple w/o LA CV: rrr. PULM: ctab, no inc wob ABD: soft, +bs EXT: no edema SKIN: well perfused.  Left shoulder without arm drop.  Pain with int and ext rotation.  + supraspinatus testing.  Distally neurovascular intact.

## 2022-11-26 ENCOUNTER — Encounter: Payer: Self-pay | Admitting: *Deleted

## 2022-11-26 NOTE — Assessment & Plan Note (Signed)
Concern for rotator cuff strain after chopping wood.  Discussed options.  Refer to orthopedics.

## 2022-11-26 NOTE — Assessment & Plan Note (Addendum)
Discussed options, patient/wife can let me know if [place] isn't affordable or tolerated.  Plan on recheck in about 3-4 months at a yearly visit.  Labs at or ahead of time.  Continue Lantus as is.

## 2022-12-31 ENCOUNTER — Telehealth: Payer: Self-pay

## 2022-12-31 ENCOUNTER — Ambulatory Visit: Payer: Medicare HMO | Admitting: Adult Health

## 2022-12-31 ENCOUNTER — Encounter: Payer: Self-pay | Admitting: Adult Health

## 2022-12-31 VITALS — BP 147/84 | HR 79 | Ht 66.0 in | Wt 220.0 lb

## 2022-12-31 DIAGNOSIS — R569 Unspecified convulsions: Secondary | ICD-10-CM | POA: Diagnosis not present

## 2022-12-31 MED ORDER — LEVETIRACETAM 1000 MG PO TABS: ORAL_TABLET | ORAL | 3 refills | Status: DC

## 2022-12-31 MED ORDER — OXCARBAZEPINE 600 MG PO TABS: 600.0000 mg | ORAL_TABLET | Freq: Two times a day (BID) | ORAL | 3 refills | Status: DC

## 2022-12-31 NOTE — Progress Notes (Signed)
PATIENT: Bradley Davila DOB: 1961-06-24  REASON FOR VISIT: follow up HISTORY FROM: patient Primary neurologist: Dr. Vickey Huger  Chief Complaint  Patient presents with   Follow-up    Pt in 8 with wife  Pt here  for seizure f/u Pt states tired all the time Pt states some headaches . 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 male with a history of Seizures. Returns today for follow-up.  Denies any seizure events.  Remains on Trileptal and Keppra.Wife reports that since December she feels that he has no motivation to do anything. Patient does feel depressed. Was in therapy but it was during Covid and online. Hasn't spoke to Dr. Para March about depression recently. Reports that he takes Delta-8 gummies to help him sleep.  Does not want to be on medication.  Wife also reports that Sugars are better- when high it affects his mentation     06/25/22: Bradley Davila is a 62 year old male with a history of seizures.  He returns today for follow-up.  His Wife reports that he had an event this week. Event this week was not witnessed. Patient doesn't know what happened. He was in his living room. Reports that all he knows is that his eyes did something funny. Reports for the rest of the day he was foggy headed and slept most of the day. Patient reports that he has had other "small" events. Wife reports that  the events she has ever witnessed --he makes a noise, turns his head, and makes a facial expression as if he is in pain.  No convulsing noted.  Fluorescein lights bother him.   Wife reports that mentally he seems to get confused more.  She states that she plans to start monitoring his medications more.  They do report that in the past MRI imaging was done and they were told that he had "adhesions" on the brain.  I do not have access to this report to review today.   01/02/21:  Bradley Davila is a 62 year old male with a history of seizures and cerebral palsy.  He returns today for follow-up.  He is here today with his wife.  She states that last weekend they were at [facility] state visiting the campus for their daughter.  She states that he had an episode in the car where he was staring off.  He was not responsive to stimuli.  She states once the event was over he was back to normal.  He denies missing any medication.  Continues on Keppra 1000 mg twice a day and Trileptal 600 mg twice a day.  He states he has not taken his Keppra dose this morning due to not eating yet.  He returns today for an evaluation.  01/03/20: Bradley Davila is a 62 year old male with a history of seizures and cerebral palsy.  He returns today for follow-up.  He is currently on Keppra 1000 mg twice a day and Trileptal 600 mg twice a day.  He denies any seizure events.  Continues to tolerate the medications well.  He does not operate a motor vehicle.  Reports in the last week he has noticed some numbness in the left hand.  Also reports that he drops things easily.  His wife thought that he may be slept on his hand the wrong way.  He denies  any discomfort of the arm.  He returns today for an evaluation.  HISTORY Bradley Davila is a 62 y.o. male and seen here as a referral/ revisit  from Dr. Para March for a transfer of care. 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 In addition he has diabetes and has developed depression, he underwent a cholecystectomy while still being my patient in 2007. In the meantime he had been followed by my former Pension scheme manager O'Donovan at [facility], but would like to return to the general neurology practice here for reasons of proximity, as well as easier emergency care. Dr. Inez Catalina noted that the  patient's seizure disorder has been very well controlled and has actually just refilled medications for the last years without any additional diagnostic studies being undertaken. For this reason I will continue his current medications.    I will try to obtain his paper records form years ago- if they still exist.     REVIEW OF SYSTEMS: Out of a complete 14 system review of symptoms, the patient complains only of the following symptoms, and all other reviewed systems are negative.  ALLERGIES: Allergies  Allergen Reactions   Benadryl [Diphenhydramine Hcl]     Caused seizures.    Yellow Jacket Venom [Honey Bee Venom]     Facial swelling after sting   Metformin And Related Diarrhea   Carbamazepine     Inc in seizures   Lamotrigine     Inc in seizures   Phenobarbital     rash   Wasp Venom Protein     Other reaction(s): Not available    HOME MEDICATIONS: Outpatient Medications Prior to Visit  Medication Sig Dispense Refill   acetaminophen (TYLENOL) 325 MG tablet You can take 2 tablets every 6 hours as needed for pain. Use this first then the prescribed pain medication.   DO NOT TAKE MORE THAN 4000 MG OF TYLENOL PER DAY.  IT CAN HARM YOUR LIVER.     atorvastatin (LIPITOR) 20 MG tablet Take 1 tablet (20 mg total) by mouth daily.     clotrimazole-betamethasone (LOTRISONE) cream Apply 1 application topically daily. 30 g 2   EPINEPHrine 0.3 mg/0.3 mL IJ SOAJ injection Inject 0.3 mg into the muscle as needed.     glucose blood (TRUE METRIX BLOOD GLUCOSE TEST) test strip TEST BLOOD SUGAR 1-3 TIMES DAILY OR AS NEEDED AS DIRECTED.  DX E11.9, insulin treated. 300 strip 12   insulin glargine (LANTUS SOLOSTAR) 100 UNIT/ML Solostar Pen Inject 45-50 Units into the skin daily. 45 mL 3   latanoprost (XALATAN) 0.005 % ophthalmic solution      levETIRAcetam (KEPPRA) 1000 MG tablet Take 1 tablet in the AM and 1 tablet at bedtime 225 tablet 0   meloxicam (MOBIC) 15 MG tablet TAKE 1 TABLET EVERY DAY WITH  FOOD AS NEEDED FOR PAIN 90 tablet 3   nystatin (MYCOSTATIN/NYSTOP) powder Apply 1 application. topically 2 (two) times daily. 60 g 0   oxcarbazepine (TRILEPTAL) 600 MG tablet Take 1 tablet (600 mg total) by mouth 2 (two) times daily. 180 tablet 0   ramipril (ALTACE) 1.25 MG capsule TAKE 1 CAPSULE BY MOUTH EVERY DAY 90 capsule 3   sitaGLIPtin (JANUVIA) 50 MG tablet Take 1 tablet (50 mg total) by mouth daily. 90 tablet 1   triamcinolone  cream (KENALOG) 0.5 % Apply 1 application topically 2 (two) times daily as needed (for itchy spots on skin). 30 g 2   TRUEPLUS LANCETS 33G MISC Use as instructed to test blood sugar once daily or as needed.  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 100 each 3   No facility-administered medications prior to visit.    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    Stroke    childhood    PAST SURGICAL HISTORY: Past Surgical History:  Procedure Laterality Date   CHOLECYSTECTOMY  2007   CYSTECTOMY     on buttock, s/p removal   HERNIA REPAIR     INSERTION OF MESH N/A 08/05/2017   Procedure: INSERTION OF MESH;  Surgeon: Abigail Miyamoto, MD;  Location: [facility] OR;  Service: General;  Laterality: N/A;   TONSILLECTOMY     UMBILICAL HERNIA REPAIR  08/05/2017   UMBILICAL HERNIA REPAIR N/A 08/05/2017   Procedure: UMBILICAL HERNIA REPAIR;  Surgeon: Abigail Miyamoto, MD;  Location: [facility] OR;  Service: General;  Laterality: N/A;   WISDOM TOOTH EXTRACTION      FAMILY  HISTORY: Family History  Problem Relation Age of Onset   Hydrocephalus Mother    Liver cancer Father    Alcohol abuse Father    Alcohol abuse Sister    Heart disease Brother    Colon cancer Neg Hx    Prostate cancer Neg Hx    Seizures Neg Hx     SOCIAL HISTORY: Social History   Socioeconomic History   Marital status: Married    Spouse name: Aggie Cosier   Number of children: 1   Years of education: Not on file   Highest education level: Not on file  Occupational History    Employer: NOT EMPLOYED  Tobacco Use   Smoking status: Some Days    Types: Cigars   Smokeless tobacco: Never   Tobacco comments:    Black and mild only   Vaping Use   Vaping Use: Never used  Substance and Sexual Activity   Alcohol use: Yes    Comment: rarely - 4 to 5 times per year   Drug use: No   Sexual activity: Yes  Other Topics Concern   Not on file  Social History Narrative   Married 2005, 1 daughter Disabled due to cva      Caffeine: soda, all day long    Social Determinants of Health   Financial Resource Strain: High Risk (09/30/2021)   Overall Financial Resource Strain (CARDIA)    Difficulty of Paying Living Expenses: Hard  Food Insecurity: No Food Insecurity (11/23/2020)   Hunger Vital Sign    Worried About Running Out of Food in the Last Year: Never true    Ran Out of Food in the Last Year: Never true  Transportation Needs: No Transportation Needs (02/18/2022)   PRAPARE - Transportation    Lack of Transportation (Medical): No    Lack  of Transportation (Non-Medical): No  Physical Activity: Insufficiently Active (05/12/2021)   Exercise Vital Sign    Days of Exercise per Week: 3 days    Minutes of Exercise per Session: 20 min  Stress: No Stress Concern Present (05/12/2021)   [employer] of Occupational Health - Occupational Stress Questionnaire    Feeling of Stress : Only a little  Social Connections: Socially Isolated (05/12/2021)   Social Connection and Isolation Panel [NHANES]     Frequency of Communication with Friends and Family: Once a week    Frequency of Social Gatherings with Friends and Family: Once a week    Attends Religious Services: Never    Database administrator or Organizations: No    Attends Banker Meetings: Never    Marital Status: Married  Catering manager Violence: Not At Risk (05/12/2021)   Humiliation, Afraid, Rape, and Kick questionnaire    Fear of Current or Ex-Partner: No    Emotionally Abused: No    Physically Abused: No    Sexually Abused: No      PHYSICAL EXAM  Vitals:   12/31/22 1443  BP: (!) 147/84  Pulse: 79  Weight: 220 lb (99.8 kg)  Height:  (1.676 m)   Body mass index is 35.51 kg/m.  Generalized: Well developed, in no acute distress   Neurological examination  Mentation: Alert oriented to time, place, history taking. Follows all commands.  Speech is  garbled Cranial nerve II-XII: Pupils were equal round reactive to light. Extraocular movements were full, visual field were full on confrontational test. Facial sensation and strength were normal. Uvula tongue midline. Head turning and shoulder shrug  were normal and symmetric. Motor: The motor testing reveals 5 over 5 strength of all 4 extremities. Good symmetric motor tone is noted throughout.  Sensory: Sensory testing is intact to soft touch on all 4 extremities. No evidence of extinction is noted. Tinel test positive on left Coordination: Cerebellar testing reveals good finger-nose-finger and heel-to-shin bilaterally.  Gait and station: Gait is normal.  Reflexes: Deep tendon reflexes are symmetric and normal bilaterally.   DIAGNOSTIC DATA (LABS, IMAGING, TESTING) - I reviewed patient records, labs, notes, testing and imaging myself where available.  Lab Results  Component Value Date   WBC 10.6 (H) 12/30/2021   HGB 14.9 12/30/2021   HCT 44.7 12/30/2021   MCV 88.7 12/30/2021   PLT 344.0 12/30/2021      Component Value Date/Time   NA 136  12/30/2021 1346   NA 139 01/02/2021 1150   K 4.5 12/30/2021 1346   CL 99 12/30/2021 1346   CO2 29 12/30/2021 1346   GLUCOSE 118 (H) 12/30/2021 1346   BUN 15 12/30/2021 1346   BUN 12 01/02/2021 1150   CREATININE 1.00 12/30/2021 1346   CALCIUM 9.7 12/30/2021 1346   PROT 7.6 12/30/2021 1346   PROT 7.7 01/02/2021 1150   ALBUMIN 4.5 12/30/2021 1346   ALBUMIN 4.4 01/02/2021 1150   AST 18 12/30/2021 1346   ALT 26 12/30/2021 1346   ALKPHOS 88 12/30/2021 1346   BILITOT 0.4 12/30/2021 1346   BILITOT 0.3 01/02/2021 1150   GFRNONAA 87 01/03/2020 1130   GFRAA 100 01/03/2020 1130   Lab Results  Component Value Date   CHOL 161 06/17/2022   HDL 43.70 06/17/2022   LDLCALC 96 06/17/2022   LDLDIRECT 99.0 12/30/2021   TRIG 109.0 06/17/2022   CHOLHDL 4 06/17/2022   Lab Results  Component Value Date   HGBA1C 8.9 (A) 11/24/2022  Lab Results  Component Value Date   VITAMINB12 781 10/21/2006   Lab Results  Component Value Date   TSH 1.14 12/30/2021      ASSESSMENT AND PLAN 62 y.o. 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 here with:  1.  Seizures  -Continue Keppra 1000 mg BID -Continue  Trileptal 600 mg BID -Drug levels were checked in October and unremarkable -Has regular blood work with Dr. Para March -Follow-up in 1 year with Dr. Vergia Alcon, MSN, NP-C 12/31/2022, 3:21 PM [facility] [address], [address] [place], [place] [address] [phone]

## 2022-12-31 NOTE — Progress Notes (Signed)
Care Management & Coordination Services Pharmacy Team  Reason for Encounter: Atorvastatin Medication adherence  Unsuccessful outreach. Left voicemail for patient to return call. Multiple attempts to reach patient.   Patient is more than 5 days past due for refill on the following medications per chart history:  Drug Name / Strength / Sig Atorvastatin 20 mg - Take 1 tablet (20 mg total) by mouth daily.  Last fill 05/21/2022 90ds verified with [facility]   Al Corpus, PharmD notified  Claudina Lick, [place] Clinical Pharmacy Assistant [phone]

## 2023-01-01 ENCOUNTER — Ambulatory Visit: Payer: Medicare HMO

## 2023-01-11 ENCOUNTER — Other Ambulatory Visit: Payer: Self-pay | Admitting: Family Medicine

## 2023-02-24 ENCOUNTER — Telehealth: Payer: Self-pay

## 2023-02-24 NOTE — Progress Notes (Signed)
Care Management & Coordination Services Pharmacy Team  Reason for Encounter: Appointment Reminder  Contacted patient to confirm telephone appointment with Al Corpus, PharmD on 02/25/2023 at 3:45.  Unsuccessful outreach. Left voicemail for patient to return call.  Al Corpus, PharmD notified  Claudina Lick, [place] Clinical Pharmacy Assistant [phone]

## 2023-02-25 ENCOUNTER — Encounter: Payer: Medicare HMO | Admitting: Pharmacist

## 2023-02-25 NOTE — Progress Notes (Unsigned)
Care Management & Coordination Services Pharmacy Note  02/25/2023 Name:  Bradley Davila MRN:  [id number] DOB:  October 29, 1960  Summary: ***  Recommendations/Changes made from today's visit: ***  Follow up plan: ***   Subjective: Bradley Davila is an 62 y.o. year old male who is a primary patient of Joaquim Nam, MD.  The care coordination team was consulted for assistance with disease management and care coordination needs.    Engaged with patient by telephone for follow up visit.  Recent office visits: ***  Recent consult visits: ***  Hospital visits: {Hospital DC Yes/No:25215}   Objective:  Lab Results  Component Value Date   CREATININE 1.00 12/30/2021   BUN 15 12/30/2021   GFR 80.87 12/30/2021   EGFR 74 01/02/2021   GFRNONAA 87 01/03/2020   GFRAA 100 01/03/2020   NA 136 12/30/2021   K 4.5 12/30/2021   CALCIUM 9.7 12/30/2021   CO2 29 12/30/2021   GLUCOSE 118 (H) 12/30/2021    Lab Results  Component Value Date/Time   HGBA1C 8.9 (A) 11/24/2022 03:58 PM   HGBA1C 8.7 (H) 06/17/2022 04:41 PM   HGBA1C 8.8 (H) 12/30/2021 01:46 PM   GFR 80.87 12/30/2021 01:46 PM   GFR 78.03 10/31/2019 10:51 AM   MICROALBUR 24.2 (H) 10/31/2019 10:51 AM   MICROALBUR 57.8 (H) 10/26/2018 08:38 AM    Last diabetic Eye exam:  Lab Results  Component Value Date/Time   HMDIABEYEEXA No Retinopathy 12/19/2021 12:00 AM    Last diabetic Foot exam: No results found for: "HMDIABFOOTEX"   Lab Results  Component Value Date   CHOL 161 06/17/2022   HDL 43.70 06/17/2022   LDLCALC 96 06/17/2022   LDLDIRECT 99.0 12/30/2021   TRIG 109.0 06/17/2022   CHOLHDL 4 06/17/2022       Latest Ref Rng & Units 12/30/2021    1:46 PM 01/02/2021   11:50 AM 01/03/2020   11:30 AM  Hepatic Function  Total Protein 6.0 - 8.3 g/dL 7.6  7.7  7.2   Albumin 3.5 - 5.2 g/dL 4.5  4.4  4.5   AST 0 - 37 U/L 18  19  18    ALT 0 - 53 U/L 26  24  23    Alk Phosphatase 39 - 117 U/L 88  101  89   Total Bilirubin 0.2  - 1.2 mg/dL 0.4  0.3  <9.5     Lab Results  Component Value Date/Time   TSH 1.14 12/30/2021 01:46 PM   TSH 1.188 10/21/2006 09:14 PM       Latest Ref Rng & Units 12/30/2021    1:46 PM 01/02/2021   11:50 AM 01/03/2020   11:30 AM  CBC  WBC 4.0 - 10.5 K/uL 10.6  9.1  9.9   Hemoglobin 13.0 - 17.0 g/dL 62.1  30.8  65.7   Hematocrit 39.0 - 52.0 % 44.7  44.8  42.6   Platelets 150.0 - 400.0 K/uL 344.0  346  373     Lab Results  Component Value Date/Time   VITAMINB12 781 10/21/2006 09:14 PM    Clinical ASCVD: {YES/NO:21197} The ASCVD Risk score (Arnett DK, et al., 2019) failed to calculate for the following reasons:   The patient has a prior MI or stroke diagnosis    ***Other: (CHADS2VASc if Afib, MMRC or CAT for COPD, ACT, DEXA)     11/24/2022    3:59 PM 02/18/2022    9:35 AM 08/21/2021   10:45 AM  Depression screen PHQ 2/9  Decreased  Interest 2 0 0  Down, Depressed, Hopeless 1 0 0  PHQ - 2 Score 3 0 0  Altered sleeping 1    Tired, decreased energy 2    Change in appetite 2    Feeling bad or failure about yourself  2    Trouble concentrating 1    Moving slowly or fidgety/restless 0    Suicidal thoughts 0    PHQ-9 Score 11    Difficult doing work/chores Somewhat difficult       Social History   Tobacco Use  Smoking Status Some Days   Types: Cigars  Smokeless Tobacco Never  Tobacco Comments   Black and mild only    BP Readings from Last 3 Encounters:  12/31/22 (!) 147/84  11/24/22 128/82  06/25/22 135/78   Pulse Readings from Last 3 Encounters:  12/31/22 79  11/24/22 78  06/25/22 77   Wt Readings from Last 3 Encounters:  12/31/22 220 lb (99.8 kg)  11/24/22 223 lb 3.2 oz (101.2 kg)  06/25/22 215 lb (97.5 kg)   BMI Readings from Last 3 Encounters:  12/31/22 35.51 kg/m  11/24/22 36.03 kg/m  06/25/22 34.70 kg/m    Allergies  Allergen Reactions   Benadryl [Diphenhydramine Hcl]     Caused seizures.    Yellow Jacket Venom [Honey Bee Venom]     Facial  swelling after sting   Metformin And Related Diarrhea   Carbamazepine     Inc in seizures   Lamotrigine     Inc in seizures   Phenobarbital     rash   Wasp Venom Protein     Other reaction(s): Not available    Medications Reviewed Today     Reviewed by Hettie Holstein, CMA (Certified Medical Assistant) on 12/31/22 at 1447  Med List Status: <None>   Medication Order Taking? Sig Documenting Provider Last Dose Status Informant  acetaminophen (TYLENOL) 325 MG tablet [id number] Yes You can take 2 tablets every 6 hours as needed for pain. Use this first then the prescribed pain medication.   DO NOT TAKE MORE THAN 4000 MG OF TYLENOL PER DAY.  IT CAN HARM YOUR LIVER. Sherrie George, PA-C Taking Active   atorvastatin (LIPITOR) 20 MG tablet [id number] Yes Take 1 tablet (20 mg total) by mouth daily. Joaquim Nam, MD Taking Active   clotrimazole-betamethasone Thurmond Butts) cream [id number] Yes Apply 1 application topically daily. Joaquim Nam, MD Taking Active   EPINEPHrine 0.3 mg/0.3 mL IJ SOAJ injection [id number] Yes Inject 0.3 mg into the muscle as needed. Hannah Beat, MD Taking Active Self           Med Note Nanetta Batty Jul 07, 2019  4:08 PM)    glucose blood (TRUE METRIX BLOOD GLUCOSE TEST) test strip [id number] Yes TEST BLOOD SUGAR 1-3 TIMES DAILY OR AS NEEDED AS DIRECTED.  DX E11.9, insulin treated. Joaquim Nam, MD Taking Active   insulin glargine (LANTUS SOLOSTAR) 100 UNIT/ML Solostar Pen [id number] Yes Inject 45-50 Units into the skin daily. Joaquim Nam, MD Taking Active   latanoprost Harrel Lemon) 0.005 % ophthalmic solution [id number] Yes  [provider] Taking Active   levETIRAcetam (KEPPRA) 1000 MG tablet [id number] Yes Take 1 tablet in the AM and 1 tablet at bedtime Joaquim Nam, MD Taking Active   meloxicam [facility]) 15 MG tablet [id number] Yes TAKE 1 TABLET EVERY DAY WITH FOOD AS NEEDED FOR PAIN Joaquim Nam, MD Taking Active   nystatin  (MYCOSTATIN/NYSTOP)  powder [id number] Yes Apply 1 application. topically 2 (two) times daily. Joaquim Nam, MD Taking Active   oxcarbazepine (TRILEPTAL) 600 MG tablet [id number] Yes Take 1 tablet (600 mg total) by mouth 2 (two) times daily. Dohmeier, Porfirio Mylar, MD Taking Active   ramipril (ALTACE) 1.25 MG capsule [id number] Yes TAKE 1 CAPSULE BY MOUTH EVERY DAY Joaquim Nam, MD Taking Active   sitaGLIPtin (JANUVIA) 50 MG tablet [id number] Yes Take 1 tablet (50 mg total) by mouth daily. Joaquim Nam, MD Taking Active   triamcinolone cream (KENALOG) 0.5 % [id number] Yes Apply 1 application topically 2 (two) times daily as needed (for itchy spots on skin). Joaquim Nam, MD Taking Active   TRUEPLUS LANCETS 33G MISC [id number] Yes Use as instructed to test blood sugar once daily or as needed.  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 Joaquim Nam, MD Taking Active             SDOH:  (Social Determinants of Health) assessments and interventions performed: {yes/no:20286} SDOH Interventions    Flowsheet Row Office Visit from 11/24/2022 in [facility] Chronic Care Management from 09/30/2021 in [facility] at [facility] Clinical Support from 05/12/2021 in [facility] at [facility] Chronic Care Management from 04/03/2021 in [facility] at [facility] Clinical Support from 10/26/2018 in [facility] at [facility] Clinical Support from 10/16/2017 in [facility] [place]  SDOH Interventions        Depression Interventions/Treatment  Counseling -- ZDG6-4 Score <4 Follow-up Not Indicated -- --  Freada Bergeron to PCP] --  [referral to PCP]  Financial Strain Interventions -- Other (Comment)  [Renew Lantus PAP 2023] -- Other (Comment)  [Cost assistance program for medications] -- --  Physical Activity Interventions -- -- Intervention Not Indicated -- -- --  Stress  Interventions -- -- Intervention Not Indicated -- -- --  Social Connections Interventions -- -- Intervention Not Indicated -- -- --       Medication Assistance: {MEDASSISTANCEINFO:25044}  Medication Access: Name and location of current pharmacy:  [facility] - [place], [place] - [address] [place] [address] Phone: [phone] Fax: [phone]  [id number] - [place], [place] - [address] [place] [address] Phone: [phone] Fax: [phone]  Within the past 30 days, how often has patient missed a dose of medication? *** Is a pillbox or other method used to improve adherence? {YES/NO:21197} Factors that may affect medication adherence? {CHL DESC; BARRIERS:21522} Are meds synced by current pharmacy? {YES/NO:21197} Are meds delivered by current pharmacy? {YES/NO:21197} Does patient experience delays in picking up medications due to transportation concerns? {YES/NO:21197}  Compliance/Adherence/Medication fill history: Care Gaps: ***  Star-Rating Drugs: ***   Assessment/Plan  Hypertension (BP goal <130/80) -Controlled -Current home readings: n/a -Current treatment: Ramipril 1.25 mg daily - Appropriate, Effective, Safe, Accessible -Medications previously tried: lisinopril  -Denies hypotensive/hypertensive symptoms -Educated on BP goals and benefits of medications for prevention of heart attack, stroke and kidney damage; -Counseled to monitor BP at home periodically -Recommended to continue current medication  Hyperlipidemia: (LDL goal < 70) -Controlled - LDL 99 (12/2021) above goal; TRIG 224 also elevated c/w uncontrolled DM; pt was advised to 1/2 dose in April -Hx Stroke -Current treatment: Atorvastatin 20 mg daily - 1/2 tab daily -Appropriate, Query Effective -Medications previously tried: none reported  -Reviewed Cholesterol goals; benefits of statin  for ASCVD risk reduction -Pt  may require ezetimibe or alternate statin (rosuva) in future to achieve LDL goal -Recommend to continue current medication for now  Diabetes (A1c goal <7.5%) -Query controlled  - A1c 8.8% (12/2021), peak 10.1% (02/2021); pt reports he stopped taking metformin in May due to diarrhea, this has improved without metformin; he reports BG is "up and down" -Current home glucose readings: 150-160 in AM -Denies hypoglycemic/hyperglycemic symptoms -Current medications: Lantus Solostar 38 units daily (PAP)-  Appropriate, Query Effective -Medications previously tried: metformin (diarrhea); Trulicity (never started due to A1c improvement prior to completion of PAP).  -Recommended to continue current medication; due for repeat A1c/PCP f/u - tried to schedule appt but pt needs to call office back with availability  Epilepsy (Goal: reduce seizure frequency) -Controlled -Follows with neurology (Dr Dohmeier); hx cerebral palsy as well -Current treatment  Keppra 1000 mg - 1 AM, 1.5 HS - Appropriate, Effective, Safe, Accessible Oxcarbazepine 600 mg BID -Appropriate, Effective, Safe, Accessible -Medications previously tried: phenobarbital, CBMZ, lamotrigine (all ineffective) -Recommended to continue current medication   ***

## 2023-03-04 ENCOUNTER — Other Ambulatory Visit: Payer: Self-pay | Admitting: Family Medicine

## 2023-03-11 DIAGNOSIS — Z0100 Encounter for examination of eyes and vision without abnormal findings: Secondary | ICD-10-CM | POA: Diagnosis not present

## 2023-03-11 DIAGNOSIS — H2513 Age-related nuclear cataract, bilateral: Secondary | ICD-10-CM | POA: Diagnosis not present

## 2023-03-11 DIAGNOSIS — H401131 Primary open-angle glaucoma, bilateral, mild stage: Secondary | ICD-10-CM | POA: Diagnosis not present

## 2023-03-11 DIAGNOSIS — E119 Type 2 diabetes mellitus without complications: Secondary | ICD-10-CM | POA: Diagnosis not present

## 2023-03-11 LAB — HM DIABETES EYE EXAM

## 2023-03-16 ENCOUNTER — Encounter: Payer: Self-pay | Admitting: Family Medicine

## 2023-03-30 ENCOUNTER — Encounter: Payer: Self-pay | Admitting: Family Medicine

## 2023-03-30 ENCOUNTER — Ambulatory Visit: Payer: Medicare HMO | Admitting: Family Medicine

## 2023-03-30 VITALS — BP 118/80 | HR 92 | Temp 97.9°F | Ht 66.0 in | Wt 218.0 lb

## 2023-03-30 DIAGNOSIS — E1169 Type 2 diabetes mellitus with other specified complication: Secondary | ICD-10-CM

## 2023-03-30 DIAGNOSIS — G40201 Localization-related (focal) (partial) symptomatic epilepsy and epileptic syndromes with complex partial seizures, not intractable, with status epilepticus: Secondary | ICD-10-CM

## 2023-03-30 DIAGNOSIS — Z794 Long term (current) use of insulin: Secondary | ICD-10-CM

## 2023-03-30 LAB — POCT GLYCOSYLATED HEMOGLOBIN (HGB A1C): Hemoglobin A1C: 8.6 % — AB (ref 4.0–5.6)

## 2023-03-30 MED ORDER — SITAGLIPTIN PHOSPHATE 100 MG PO TABS: 100.0000 mg | ORAL_TABLET | Freq: Every day | ORAL | Status: DC

## 2023-03-30 NOTE — Progress Notes (Unsigned)
Diabetes:  Using medications without difficulties: Hypoglycemic episodes: Hyperglycemic episodes: Feet problems: Blood Sugars averaging: 122 this AM but usually higher in than that in the AMs (but still below 200).  PM readings usually above 200.  eye exam within last year: A1c done at [facility].  8.6.   He had spell with brief change in mentation at the OV.  He was able to move his extremities and hear me but transiently had inability to respond.  Then returned to baseline.  He had h/o similar-wife has noticed this multiple times over the years.  He is still on his baseline meds, compliant.  The episode lasted approximately 20 seconds.  No apnea during the event.  Meds, vitals, and allergies reviewed.   ROS: Per HPI unless specifically indicated in ROS section   GEN: nad, alert and oriented HEENT: mucous membranes moist NECK: supple w/o LA CV: rrr. PULM: ctab, no inc wob ABD: soft, +bs EXT: no edema SKIN: Well-perfused  30 minutes were devoted to patient care in this encounter (this includes time spent reviewing the patient's file/history, interviewing and examining the patient, counseling/reviewing plan with patient).

## 2023-03-30 NOTE — Patient Instructions (Signed)
Take 100mg  [place].   Goal AM sugar ~120-150, goal PM sugar ~150-200, but these goals may need to change.   You may need to taper insulin by 1 unit per day.    Recheck in about 3 months with A1c at visit.

## 2023-04-01 NOTE — Assessment & Plan Note (Signed)
Discussed with patient that I need to check on januvia options for 100mg .  I will check with pharmacy staff here in clinic.  He may need a lower dose of insulin while on 100 mg Januvia.  Recheck periodically.  Continue work on diabetic diet.

## 2023-04-01 NOTE — Assessment & Plan Note (Signed)
See above regarding the description of his event.  He is compliant with his medication and his mentation returned to baseline immediately after the event.  I am going to ask for neurology input on this.

## 2023-05-01 ENCOUNTER — Other Ambulatory Visit: Payer: Self-pay | Admitting: Family Medicine

## 2023-06-10 ENCOUNTER — Ambulatory Visit: Payer: Medicare HMO

## 2023-06-10 VITALS — Ht 66.0 in | Wt 218.0 lb

## 2023-06-10 DIAGNOSIS — Z0000 Encounter for general adult medical examination without abnormal findings: Secondary | ICD-10-CM | POA: Diagnosis not present

## 2023-06-10 NOTE — Patient Instructions (Addendum)
Mr. Linscheid , Thank you for taking time to come for your Medicare Wellness Visit. I appreciate your ongoing commitment to your health goals. Please review the following plan we discussed and let me know if I can assist you in the future.   Referrals/Orders/Follow-Ups/Clinician Recommendations: none  This is a list of the screening recommended for you and due dates:  Health Maintenance  Topic Date Due   DTaP/Tdap/Td vaccine (1 - Tdap) Never done   Zoster (Shingles) Vaccine (1 of 2) Never done   Yearly kidney health urinalysis for diabetes  10/30/2020   Yearly kidney function blood test for diabetes  12/31/2022   Flu Shot  Never done   Colon Cancer Screening  11/24/2023*   Complete foot exam   06/18/2023   Hemoglobin A1C  09/30/2023   Eye exam for diabetics  03/10/2024   Medicare Annual Wellness Visit  06/09/2024   Hepatitis C Screening  Completed   HIV Screening  Completed   HPV Vaccine  Aged Out   COVID-19 Vaccine  Discontinued  *Topic was postponed. The date shown is not the original due date.    Advanced directives: (Declined) Advance directive discussed with you today. Even though you declined this today, please call our office should you change your mind, and we can give you the proper paperwork for you to fill out.  Next Medicare Annual Wellness Visit scheduled for next year: Yes  06/14/24 @ 10am telephone

## 2023-06-10 NOTE — Progress Notes (Signed)
Subjective:   Bradley Davila is a 62 y.o. male who presents for Medicare Annual/Subsequent preventive examination.  Visit Complete: Virtual  I connected with  Langley Adie on 06/10/23 by a audio enabled telemedicine application and verified that I am speaking with the correct person using two identifiers.  Patient Location: Home  Provider Location: Home Office  I discussed the limitations of evaluation and management by telemedicine. The patient expressed understanding and agreed to proceed.  Because this visit was a virtual/telehealth visit, some criteria may be missing or patient reported. Any vitals not documented were not able to be obtained and vitals that have been documented are patient reported.   Patient Medicare AWV questionnaire was completed by the patient on (not done); I have confirmed that all information answered by patient is correct and no changes since this date. Cardiac Risk Factors include: advanced age (>49men, >75 women);diabetes mellitus;dyslipidemia;obesity (BMI >30kg/m2);sedentary lifestyle;smoking/ tobacco exposure    Objective:    Today's Vitals   06/10/23 1335  Weight: 218 lb (98.9 kg)  Height: 5\' 6"  (1.676 m)   Body mass index is 35.19 kg/m.     06/10/2023    1:46 PM 02/18/2022    9:37 AM 08/21/2021   10:45 AM 07/01/2019   12:17 PM 06/10/2019    2:44 PM 10/26/2018    8:35 AM 10/16/2017   10:17 AM  Advanced Directives  Does Patient Have a Medical Advance Directive? No No No No No No No  Would patient like information on creating a medical advance directive?  No - Patient declined No - Patient declined No - Patient declined No - Patient declined Yes (MAU/Ambulatory/Procedural Areas - Information given) No - Patient declined    Current Medications (verified) Outpatient Encounter Medications as of 06/10/2023  Medication Sig   acetaminophen (TYLENOL) 325 MG tablet You can take 2 tablets every 6 hours as needed for pain. Use this first then the  prescribed pain medication.   DO NOT TAKE MORE THAN 4000 MG OF TYLENOL PER DAY.  IT CAN HARM YOUR LIVER.   atorvastatin (LIPITOR) 20 MG tablet TAKE 1 TABLET EVERY DAY   clotrimazole-betamethasone (LOTRISONE) cream Apply 1 application topically daily.   EPINEPHrine 0.3 mg/0.3 mL IJ SOAJ injection Inject 0.3 mg into the muscle as needed.   glucose blood (TRUE METRIX BLOOD GLUCOSE TEST) test strip TEST BLOOD SUGAR ONE TIME DAILY OR AS NEEDED AS DIRECTED   insulin glargine (LANTUS SOLOSTAR) 100 UNIT/ML Solostar Pen Inject 45-50 Units into the skin daily.   latanoprost (XALATAN) 0.005 % ophthalmic solution    levETIRAcetam (KEPPRA) 1000 MG tablet Take 1 tablet in the AM and 1 tablet at bedtime   meloxicam (MOBIC) 15 MG tablet TAKE 1 TABLET EVERY DAY WITH FOOD AS NEEDED FOR PAIN   nystatin (MYCOSTATIN/NYSTOP) powder Apply 1 application. topically 2 (two) times daily.   oxcarbazepine (TRILEPTAL) 600 MG tablet Take 1 tablet (600 mg total) by mouth 2 (two) times daily.   ramipril (ALTACE) 1.25 MG capsule TAKE 1 CAPSULE BY MOUTH EVERY DAY   sitaGLIPtin (JANUVIA) 100 MG tablet Take 1 tablet (100 mg total) by mouth daily.   triamcinolone cream (KENALOG) 0.5 % Apply 1 application topically 2 (two) times daily as needed (for itchy spots on skin).   TRUEPLUS LANCETS 33G MISC Use as instructed to test blood sugar once daily or as needed.  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   No facility-administered encounter medications on file as of 06/10/2023.  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 weren't met   Diabetes mellitus without  complication (HCC)    Dysphagia as late effect of stroke    Headache(784.0)    Hypertension    Seizures (HCC)    h/o grand mal and staring episodes, followed by [facility] neuro, last grand mal July 09, 2013 last seizure   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    Stroke [facility])    childhood   Past Surgical History:  Procedure Laterality Date   CHOLECYSTECTOMY  2007   CYSTECTOMY     on buttock, s/p removal   HERNIA REPAIR     INSERTION OF MESH N/A 08/05/2017   Procedure: INSERTION OF MESH;  Surgeon: Abigail Miyamoto, MD;  [facility];  Service: General;  Laterality: N/A;   TONSILLECTOMY     UMBILICAL HERNIA REPAIR  08/05/2017   UMBILICAL HERNIA REPAIR N/A 08/05/2017   Procedure: UMBILICAL HERNIA REPAIR;  Surgeon: Abigail Miyamoto, MD;  Location: [facility] OR;  Service: General;  Laterality: N/A;   WISDOM TOOTH EXTRACTION     Family History  Problem Relation Age of Onset   Hydrocephalus Mother    Liver cancer Father    Alcohol abuse Father    Alcohol abuse Sister    Heart disease Brother    Colon cancer Neg Hx    Prostate cancer Neg Hx    Seizures Neg Hx    Social History   Socioeconomic History   Marital status: Married    Spouse name: Aggie Cosier   Number of children: 1   Years of education: Not on file   Highest education level: Not on file  Occupational History    Employer: NOT EMPLOYED  Tobacco Use   Smoking status: Some Days    Types: Cigars   Smokeless tobacco: Never   Tobacco comments:    Black and mild only   Vaping Use   Vaping status: Never Used  Substance and Sexual Activity   Alcohol use: Yes    Comment: rarely - 4 to 5 times per year   Drug use: No   Sexual activity: Yes  Other Topics Concern   Not on file  Social History Narrative   Married 2005, 1 daughter Disabled due to cva      Caffeine: soda, all day long    Social Determinants of Health   Financial Resource Strain: Medium Risk (06/10/2023)   Overall Financial Resource Strain  (CARDIA)    Difficulty of Paying Living Expenses: Somewhat hard  Food Insecurity: No Food Insecurity (06/10/2023)   Hunger Vital Sign    Worried About Running Out of Food in the Last Year: Never true    Ran Out of Food in the Last Year: Never true  Transportation Needs: No Transportation Needs (06/10/2023)   PRAPARE - Administrator, Civil Service (Medical): No    Lack of Transportation (Non-Medical): No  Physical Activity: Inactive (06/10/2023)   Exercise Vital Sign    Days of Exercise per Week: 0 days    Minutes of Exercise per Session: 0 min  Stress: No Stress Concern Present (06/10/2023)   [employer] of  Occupational Health - Occupational Stress Questionnaire    Feeling of Stress : Only a little  Social Connections: Moderately Isolated (06/10/2023)   Social Connection and Isolation Panel [NHANES]    Frequency of Communication with Friends and Family: Three times a week    Frequency of Social Gatherings with Friends and Family: Never    Attends Religious Services: Never    Database administrator or Organizations: No    Attends Engineer, structural: Never    Marital Status: Married    Tobacco Counseling Ready to quit: Not Answered Counseling given: Not Answered Tobacco comments: Black and mild only    Clinical Intake:  Pre-visit preparation completed: Yes  Pain : No/denies pain     BMI - recorded: 35.19 Nutritional Status: BMI > 30  Obese Nutritional Risks: None Diabetes: Yes CBG done?: Yes (bs 136 this am at home) CBG resulted in Enter/ Edit results?: No Did pt. bring in CBG monitor from home?: No  How often do you need to have someone help you when you read instructions, pamphlets, or other written materials from your doctor or pharmacy?: 1 - Never  Interpreter Needed?: No  Comments: lives with spouse Information entered by :: B.Nezar Buckles,LPN   Activities of Daily Living    06/10/2023    1:46 PM  In your present state of health, do  you have any difficulty performing the following activities:  Hearing? 0  Vision? 0  Difficulty concentrating or making decisions? 1  Walking or climbing stairs? 0  Dressing or bathing? 0  Doing errands, shopping? 1  Preparing Food and eating ? N  Using the Toilet? N  In the past six months, have you accidently leaked urine? N  Do you have problems with loss of bowel control? N  Managing your Medications? N  Managing your Finances? N  Housekeeping or managing your Housekeeping? N    Patient Care Team: Joaquim Nam, MD as PCP - General (Family Medicine) [place] Crane, MD as Consulting Physician (Ophthalmology) Kathyrn Sheriff, [facility] (Inactive) as Pharmacist (Pharmacist)  Indicate any recent Medical Services you may have received from other than [facility] providers in the past year (date may be approximate).     Assessment:   This is a routine wellness examination for Bradley Davila.  Hearing/Vision screen Hearing Screening - Comments:: Pt says hearing is ok Vision Screening - Comments:: Pt says wears glasses:sees good   Goals Addressed             This Visit's Progress    DIET - INCREASE WATER INTAKE   Not on track    Starting 10/26/2018, I will attempt to drink at least 6 glasses of water daily.        Depression Screen    06/10/2023    1:42 PM 11/24/2022    3:59 PM 02/18/2022    9:35 AM 08/21/2021   10:45 AM 05/12/2021    8:52 AM 11/23/2020   10:02 AM 05/28/2020   11:02 AM  PHQ 2/9 Scores  PHQ - 2 Score 3 3 0 0 4 0 0  PHQ- 9 Score 11 11   4       Fall Risk    06/10/2023    1:39 PM 03/30/2023    4:13 PM 11/24/2022    3:58 PM 02/18/2022    9:35 AM 08/21/2021   10:45 AM  Fall Risk   Falls in the past year? 0 0 0 0 0  Number falls in past yr: 0  0 1    Injury with Fall? 0 0 0    Risk for fall due to : No Fall Risks No Fall Risks     Follow up Education provided;Falls prevention discussed Falls evaluation completed Falls evaluation completed;Education provided;Falls  prevention discussed      MEDICARE RISK AT HOME: Medicare Risk at Home Any stairs in or around the home?: Yes If so, are there any without handrails?: Yes Home free of loose throw rugs in walkways, pet beds, electrical cords, etc?: Yes Adequate lighting in your home to reduce risk of falls?: Yes Life alert?: No Use of a cane, walker or w/c?: No Grab bars in the bathroom?: No Shower chair or bench in shower?: No Elevated toilet seat or a handicapped toilet?: No  TIMED UP AND GO:  Was the test performed?  No    Cognitive Function:    10/26/2018    8:35 AM 10/16/2017   10:16 AM  MMSE - Mini Mental State Exam  Orientation to time 5 5  Orientation to Place 5 5  Registration 3 3  Attention/ Calculation 0 0  Recall 2 3  Recall-comments unable to recall 1 of 3 words   Language- name 2 objects 0 0  Language- repeat 1 1  Language- follow 3 step command 3 3  Language- read & follow direction 0 0  Write a sentence 0 0  Copy design 0 0  Total score 19 20        06/10/2023    1:50 PM 05/12/2021    8:55 AM  6CIT Screen  What Year? 0 points 0 points  What month? 0 points 0 points  What time? 0 points 0 points  Count back from 20 0 points 0 points  Months in reverse -- 0 points  Repeat phrase  0 points  Total Score  0 points    Immunizations Immunization History  Administered Date(s) Administered   PFIZER(Purple Top)SARS-COV-2 Vaccination 12/03/2019, 01/03/2020, 11/17/2020    TDAP status: Due, Education has been provided regarding the importance of this vaccine. Advised may receive this vaccine at local pharmacy or Health Dept. Aware to provide a copy of the vaccination record if obtained from local pharmacy or Health Dept. Verbalized acceptance and understanding.  Flu Vaccine status: Declined, Education has been provided regarding the importance of this vaccine but patient still declined. Advised may receive this vaccine at local pharmacy or Health Dept. Aware to provide a  copy of the vaccination record if obtained from local pharmacy or Health Dept. Verbalized acceptance and understanding.  Pneumococcal vaccine status: Declined,  Education has been provided regarding the importance of this vaccine but patient still declined. Advised may receive this vaccine at local pharmacy or Health Dept. Aware to provide a copy of the vaccination record if obtained from local pharmacy or Health Dept. Verbalized acceptance and understanding.   Covid-19 vaccine status: Declined, Education has been provided regarding the importance of this vaccine but patient still declined. Advised may receive this vaccine at local pharmacy or Health Dept.or vaccine clinic. Aware to provide a copy of the vaccination record if obtained from local pharmacy or Health Dept. Verbalized acceptance and understanding.  Qualifies for Shingles Vaccine? Yes   Zostavax completed No   Shingrix Completed?: No.    Education has been provided regarding the importance of this vaccine. Patient has been advised to call insurance company to determine out of pocket expense if they have not yet received this vaccine. Advised may also receive vaccine  at local pharmacy or Health Dept. Verbalized acceptance and understanding.  Screening Tests Health Maintenance  Topic Date Due   DTaP/Tdap/Td (1 - Tdap) Never done   Zoster Vaccines- Shingrix (1 of 2) Never done   Diabetic kidney evaluation - Urine ACR  10/30/2020   Diabetic kidney evaluation - eGFR measurement  12/31/2022   INFLUENZA VACCINE  Never done   Colonoscopy  11/24/2023 (Originally 06/17/2019)   FOOT EXAM  06/18/2023   HEMOGLOBIN A1C  09/30/2023   OPHTHALMOLOGY EXAM  03/10/2024   Medicare Annual Wellness (AWV)  06/09/2024   Hepatitis C Screening  Completed   HIV Screening  Completed   HPV VACCINES  Aged Out   COVID-19 Vaccine  Discontinued    Health Maintenance  Health Maintenance Due  Topic Date Due   DTaP/Tdap/Td (1 - Tdap) Never done   Zoster  Vaccines- Shingrix (1 of 2) Never done   Diabetic kidney evaluation - Urine ACR  10/30/2020   Diabetic kidney evaluation - eGFR measurement  12/31/2022   INFLUENZA VACCINE  Never done    Colorectal cancer screening: Type of screening: Colonoscopy. Completed yes. Repeat every 5-10 years  Lung Cancer Screening: (Low Dose CT Chest recommended if Age 66-80 years, 20 pack-year currently smoking OR have quit w/in 15years.) does not qualify.   Lung Cancer Screening Referral: no declines  Additional Screening:  Hepatitis C Screening: does not qualify; Completed yes  Vision Screening: Recommended annual ophthalmology exams for early detection of glaucoma and other disorders of the eye. Is the patient up to date with their annual eye exam?  No  Who is the provider or what is the name of the office in which the patient attends annual eye exams? Dr Brooke Dare If pt is not established with a provider, would they like to be referred to a provider to establish care? No .   Dental Screening: Recommended annual dental exams for proper oral hygiene  Diabetic Foot Exam: Diabetic Foot Exam: Completed yes  Community Resource Referral / Chronic Care Management: CRR required this visit?  No   CCM required this visit?  No    Plan:     I have personally reviewed and noted the following in the patient's chart:   Medical and social history Use of alcohol, tobacco or illicit drugs  Current medications and supplements including opioid prescriptions. Patient is not currently taking opioid prescriptions. Functional ability and status Nutritional status Physical activity Advanced directives List of other physicians Hospitalizations, surgeries, and ER visits in previous 12 months Vitals Screenings to include cognitive, depression, and falls Referrals and appointments  In addition, I have reviewed and discussed with patient certain preventive protocols, quality metrics, and best practice recommendations. A  written personalized care plan for preventive services as well as general preventive health recommendations were provided to patient.    Sue Lush, LPN   05/29/7828   After Visit Summary: (MyChart) Due to this being a telephonic visit, the after visit summary with patients personalized plan was offered to patient via MyChart   Nurse Notes: The patient states he is doing alright and has no concerns or questions at this time.  Pt due for: Diabetic Kidney evauation, DTAP, and Shingrix

## 2023-06-12 ENCOUNTER — Encounter: Payer: Self-pay | Admitting: Family Medicine

## 2023-06-12 ENCOUNTER — Ambulatory Visit (INDEPENDENT_AMBULATORY_CARE_PROVIDER_SITE_OTHER): Payer: Medicare HMO | Admitting: Family Medicine

## 2023-06-12 VITALS — BP 122/80 | HR 89 | Temp 97.8°F | Ht 66.0 in | Wt 210.0 lb

## 2023-06-12 DIAGNOSIS — E1169 Type 2 diabetes mellitus with other specified complication: Secondary | ICD-10-CM | POA: Diagnosis not present

## 2023-06-12 DIAGNOSIS — R4586 Emotional lability: Secondary | ICD-10-CM

## 2023-06-12 DIAGNOSIS — Z794 Long term (current) use of insulin: Secondary | ICD-10-CM

## 2023-06-12 LAB — POCT GLYCOSYLATED HEMOGLOBIN (HGB A1C): Hemoglobin A1C: 8.1 % — AB (ref 4.0–5.6)

## 2023-06-12 MED ORDER — METFORMIN HCL ER 500 MG PO TB24: 500.0000 mg | ORAL_TABLET | Freq: Every day | ORAL | Status: DC

## 2023-06-12 MED ORDER — PRAVASTATIN SODIUM 10 MG PO TABS: 10.0000 mg | ORAL_TABLET | Freq: Every day | ORAL | 3 refills | Status: DC

## 2023-06-12 MED ORDER — RAMIPRIL 1.25 MG PO CAPS
ORAL_CAPSULE | ORAL | 3 refills | Status: DC
Start: 2023-06-12 — End: 2023-12-31

## 2023-06-12 NOTE — Progress Notes (Unsigned)
Diabetes:  Using medications without difficulties: Hypoglycemic episodes: Hyperglycemic episodes: Feet problems: Blood Sugars averaging:90s-120s in the AMs.   eye exam within last year: He cut out snaking at night.   He had aches on janvuvia that improved off med.   Able to tolerate 500mg  XR metformin.  Taking 45-50 units insulin per day.  He cut back on soda.   A1c improved to 8.1.    D/w pt about trial of pravastatin.    1cm nonulcerated healing noninfected spot on the R elbow, not painful.  He had scratches at the area in the meantime.    Mood is lower.  He is at home alone more, since wife is working.  He tried counseling online w/o relief.  He is worried about extended family out of town.  No SI/HI.    He had aches on atorvastatin, improved med.    Meds, vitals, and allergies reviewed.   ROS: Per HPI unless specifically indicated in ROS section   GEN: nad, alert and oriented HEENT: mucous membranes moist NECK: supple w/o LA CV: rrr. PULM: ctab, no inc wob ABD: soft, +bs EXT: no edema SKIN: improving blanching rash on the top of the R foot, unclear if from an insect bite.  Itchy.   Diabetic foot exam: Normal inspection No skin breakdown No calluses  Normal DP pulses Normal sensation to light touch and monofilament Nails normal

## 2023-06-12 NOTE — Patient Instructions (Addendum)
Recheck in about 3-4 months at a yearly visit with labs at the visit.   Try pravastatin in the meantime.  Stop if you have aches.    Take care.  Glad to see you.

## 2023-06-14 ENCOUNTER — Telehealth: Payer: Self-pay | Admitting: Family Medicine

## 2023-06-14 NOTE — Assessment & Plan Note (Signed)
Mood is lower.  He is at home alone more, since wife is working.  He tried counseling online w/o relief.  He is worried about extended family out of town.  No SI/HI.  He is also taking Keppra and Trileptal.  It is unclear if either medication is affecting his mood.  Discussed getting neurology input.  See following phone note.

## 2023-06-14 NOTE — Assessment & Plan Note (Signed)
He cut out snaking at night.   He had aches on janvuvia that improved off med.   Able to tolerate 500mg  XR metformin.  Taking 45-50 units insulin per day.  He cut back on soda.   A1c improved to 8.1.   D/w pt about trial of pravastatin.   Recheck periodically and continue work on diet.   His A1c may continue to improve in the meantime.

## 2023-06-14 NOTE — Telephone Encounter (Signed)
Dr. Ceasar Mons need your input.  This patient's mood is lower and I do not know if it is related to or exacerbated by Trileptal or Keppra use.  I wanted your input.  I did not change his medications in the meantime.  Many thanks.

## 2023-06-15 ENCOUNTER — Other Ambulatory Visit: Payer: Self-pay | Admitting: Neurology

## 2023-06-15 MED ORDER — BRIVARACETAM 100 MG PO TABS: 100.0000 mg | ORAL_TABLET | Freq: Two times a day (BID) | ORAL | 5 refills | Status: DC

## 2023-06-15 NOTE — Progress Notes (Signed)
Changing from 1000 mg keppra bid to 100 mg breviact for mood changes.   Once the patient receives the breviact he will stop Keppra/ leviracetam and convert immediately to 100 mg bid of BREVIACT.

## 2023-06-16 NOTE — Telephone Encounter (Signed)
Dohmeier, Bradley Mylar, MD  You; Judi Cong, [place] hours ago (1:08 PM)   CD I placed BREVIACT script  and hope that it will get through soon. The medication will replace Keppra 1000 mg bid by using instead 100 mg bid of breviact. No wash out needed.  Melvyn Novas, MD   Dohmeier, Bradley Mylar, MD  You; Judi Cong, [place] hours ago (1:04 PM)   CD We can change keppra to breviact - that is a close relative with less mood change . I can convert him  on a scale form `10 keppra to 1 breviact.  Let me do this now and see if his mood improves. CD  ============= Greatly appreciate neurology input.

## 2023-07-01 ENCOUNTER — Telehealth: Payer: Self-pay

## 2023-07-01 NOTE — Telephone Encounter (Signed)
*  [facility]  Pharmacy Patient Advocate Encounter   Received notification from CoverMyMeds that prior authorization for Briviact 100MG  tablets  is required/requested.   Insurance verification completed.   The patient is insured through [place] .   Per test claim: PA required; PA submitted to [facility] via CoverMyMeds Key/confirmation #/EOC [id number] Status is pending

## 2023-07-02 NOTE — Telephone Encounter (Signed)
Received communication from [place], stating pt's Prior Authorization for Briviact 100 mg was approved until 09/14/2024.

## 2023-08-12 ENCOUNTER — Other Ambulatory Visit: Payer: Self-pay | Admitting: Neurology

## 2023-10-07 ENCOUNTER — Encounter: Payer: Self-pay | Admitting: Adult Health

## 2023-10-07 ENCOUNTER — Telehealth: Payer: Self-pay | Admitting: Adult Health

## 2023-10-07 NOTE — Telephone Encounter (Signed)
LVM and sent letter in mail informing pt of need to reschedule 01/05/24 appt - NP schedule change

## 2023-10-09 ENCOUNTER — Telehealth: Payer: Self-pay | Admitting: Adult Health

## 2023-10-09 NOTE — Telephone Encounter (Signed)
Pt's wife rescheduled cx appt

## 2023-10-12 ENCOUNTER — Other Ambulatory Visit: Payer: Self-pay | Admitting: *Deleted

## 2023-10-12 ENCOUNTER — Telehealth: Payer: Self-pay | Admitting: Adult Health

## 2023-10-12 MED ORDER — LEVETIRACETAM 1000 MG PO TABS: 1000.0000 mg | ORAL_TABLET | Freq: Two times a day (BID) | ORAL | 0 refills | Status: DC

## 2023-10-12 NOTE — Telephone Encounter (Signed)
Wife reports while waiting on levETIRAcetam (KEPPRA) 1000 MG tablet  from [facility] pt needs a few days worth of the medication called into [facility] Store ID: [id number].  Wife is asking for 2 weeks worth to please be called in.

## 2023-10-12 NOTE — Telephone Encounter (Signed)
Reviewed chart. Briviact was ordered back in September/October for pt to switch to from Keppra due to decreased mood. I called the pt's wife to check on this. The patient was listening in the background. I was told pt did not start Briviact because it was too expensive. He also prefers to talk to Dr Vickey Huger before making any changes to medication since he's been on the Keppra for so long. Pt's wife states for months patient has had a rough time. I was able to move up his May appt to 10/22/23 at 830 AM with Dr Dohmeier, check in at 8:00 AM. I was told pt is taking Keppra 1000 mg BID as prescribed. [facility] is evidently waiting on refills from [place]. I don't see any in the chart. She asked for a 2 week supply in the [facility] (done). She was very appreciative for the call back.

## 2023-10-12 NOTE — Addendum Note (Signed)
Addended by: Melvyn Novas on: 10/12/2023 12:30 PM   Modules accepted: Orders

## 2023-10-22 ENCOUNTER — Encounter: Payer: Self-pay | Admitting: Neurology

## 2023-10-22 ENCOUNTER — Ambulatory Visit: Payer: Medicare HMO | Admitting: Neurology

## 2023-10-22 VITALS — BP 122/76 | HR 77 | Ht 66.0 in | Wt 212.0 lb

## 2023-10-22 DIAGNOSIS — G808 Other cerebral palsy: Secondary | ICD-10-CM

## 2023-10-22 DIAGNOSIS — E1169 Type 2 diabetes mellitus with other specified complication: Secondary | ICD-10-CM | POA: Diagnosis not present

## 2023-10-22 DIAGNOSIS — G40201 Localization-related (focal) (partial) symptomatic epilepsy and epileptic syndromes with complex partial seizures, not intractable, with status epilepticus: Secondary | ICD-10-CM | POA: Diagnosis not present

## 2023-10-22 DIAGNOSIS — Z794 Long term (current) use of insulin: Secondary | ICD-10-CM | POA: Diagnosis not present

## 2023-10-22 MED ORDER — OXCARBAZEPINE 600 MG PO TABS: 600.0000 mg | ORAL_TABLET | Freq: Two times a day (BID) | ORAL | 3 refills | Status: DC

## 2023-10-22 MED ORDER — LEVETIRACETAM 1000 MG PO TABS: 1000.0000 mg | ORAL_TABLET | Freq: Two times a day (BID) | ORAL | 0 refills | Status: DC

## 2023-10-22 NOTE — Patient Instructions (Addendum)
 StariSSESSMENT AND PLAN:  63 y.o. 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  He learnt to walk  at 18 months.     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  Keppra  and oxcarbazepine . I will not order a new EEG.  He had an ambulatory EEG and here standart EEG< Had an EMU stay in [place] .  Reportedly suffered  3 seizures - one seizure that put out his back .   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 This is static, unchanged for years.   3) Much better controlled blood glucose now, BMI under 35.   4) skin blisters - could this be MRSA? PCP : Can he have a dermatology referral?   5) refilled meds.   I plan to follow up either personally or through our NP within 12 months.   Will order a CBC diff, and CMET( sodium level). Keppra  and  Oxcarba levels.  Trough today-      I would like to thank Cleatus Arlyss RAMAN, Md [address] [place],  [place] [address] for allowing me to meet with and to take care of this pleasant patient. ng attacks, loss of awareness , non responsive to verbal stimuli- (he reports being out, tunnel vision , doesn't hear )

## 2023-10-22 NOTE — Progress Notes (Signed)
 Provider:  Dedra Gores, MD  Primary Care Physician:  Bradley Arlyss RAMAN, MD [address] [place] [address]     Referring Provider: Cleatus Arlyss RAMAN, Md [address] [place],  [place] [address]          Chief Complaint according to patient   Patient presents with:                HISTORY OF PRESENT ILLNESS:  Bradley Davila is a 63 y.o. male patient who is here for revisit 10/22/2023. History of cerebral a palsy, DM, psoriais, and seizures,.  .  Chief concern according to patient :  Presents today for follow up for seizures. He has not had any grand mal seizures since 2014 but has small episodes that can occur with sugar fluctuating or fluorescent lights. Needs refills on 2 meds. Dr Bradley had witnessed a staring spell in September 2024.    Seizure frequency depends on stress level, as tracked by his wife. His glucose control has improved and he  felt less frequent spells. He can be worried and anxious about many things. He avoids fluorescent lights. His mood has been impacted - and a change to breviact to help with depression was not possible , due to a copay of 900 USD a months.    He got infected with COVID 05-2022, milder sinus head cold  symptom. Had been 5 times Vaccinated and boosted.  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 He has a glossopharyngeal weakness . His paralysis also manifested as right-sided body weakness. In addition, he has diabetes and has developed depression, he underwent a cholecystectomy while still being my patient in 2007.  In the meantime he had been followed by my former Pension Scheme Manager Bradley Davila at [facility], but would like to return to the general neurology practice here for reasons of proximity as well as easier emergency care. Dr. MALVA Davila noted that the patient's seizure disorder has been very well controlled  and has actually just refilled medications for the last years without any additional diagnostic studies being undertaken. For this reason I will continue his current medications.     Review of Systems: Out of a complete 14 system review, the patient complains of only the following symptoms, and all other reviewed systems are negative.:  Fatigue, Dysarthria, drooling,      12-2021; EEG Impression: This is a normal EEG recording in the waking and drowsy state. No evidence of interictal epileptiform discharges seen. A normal EEG does not exclude a diagnosis of epilepsy.   Social History   Socioeconomic History   Marital status: Married    Spouse name: Bradley Davila   Number of children: 1   Years of education: Not on file   Highest education level: Not on file  Occupational History    Employer: NOT EMPLOYED  Tobacco Use   Smoking status: Some Days    Types: Cigars   Smokeless tobacco: Never   Tobacco comments:    Pt smokes 7 cigars a week   Vaping Use   Vaping status: Never Used  Substance and Sexual Activity   Alcohol use: Yes    Comment: rarley   Drug use: No   Sexual activity: Yes  Other Topics Concern   Not on file  Social History Narrative   Married 2005,  1 daughter Disabled due to cva      Caffeine: soda, all day long    Pt lives wife   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 [employer] Strain: Medium Risk (06/10/2023)   Overall Financial Resource Strain (CARDIA)    Difficulty of Paying Living Expenses: Somewhat hard  Food Insecurity: No Food Insecurity (06/10/2023)   Hunger Vital Sign    Worried About Running Out of Food in the Last Year: Never true    Ran Out of Food in the Last Year: Never true  Transportation Needs: No Transportation Needs (06/10/2023)   PRAPARE - Administrator, Civil Service (Medical): No    Lack of Transportation (Non-Medical): No  Physical Activity: Inactive (06/10/2023)   Exercise Vital Sign    Days of Exercise per Week:  0 days    Minutes of Exercise per Session: 0 min  Stress: No Stress Concern Present (06/10/2023)   [employer] of Occupational Health - Occupational Stress Questionnaire    Feeling of Stress : Only a little  Social Connections: Moderately Isolated (06/10/2023)   Social Connection and Isolation Panel [NHANES]    Frequency of Communication with Friends and Family: Three times a week    Frequency of Social Gatherings with Friends and Family: Never    Attends Religious Services: Never    Database Administrator or Organizations: No    Attends Engineer, Structural: Never    Marital Status: Married    Family History  Problem Relation Age of Onset   Hydrocephalus Mother    Liver cancer Father    Alcohol abuse Father    Alcohol abuse Sister    Heart disease Brother    Colon cancer Neg Hx    Prostate cancer Neg Hx    Seizures Neg Hx     Past Medical History:  Diagnosis Date   Complication of anesthesia    slow to wake up from anesthesia after gall bladder surgery   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, noted in childhood when developmental milestones weren't met   Diabetes mellitus without complication (HCC)    Dysphagia as late effect of stroke    Headache(784.0)    Hypertension    Seizures (HCC)    h/o grand mal and staring episodes, followed by [facility] neuro, last grand mal July 09, 2013 last seizure   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    Stroke [facility])    childhood    Past Surgical History:  Procedure Laterality Date   CHOLECYSTECTOMY  2007   CYSTECTOMY     on buttock, s/p removal   HERNIA REPAIR     INSERTION OF MESH N/A 08/05/2017   Procedure: INSERTION OF MESH;  Surgeon: Vernetta Berg, MD;  Location: [facility] OR;  Service: General;  Laterality: N/A;   TONSILLECTOMY     UMBILICAL HERNIA REPAIR  08/05/2017   UMBILICAL HERNIA REPAIR N/A 08/05/2017   Procedure: UMBILICAL HERNIA REPAIR;  Surgeon: Vernetta Berg, MD;  Location: [facility] OR;   Service: General;  Laterality: N/A;   WISDOM TOOTH EXTRACTION       Current Outpatient Medications on File Prior to Visit  Medication Sig Dispense Refill   acetaminophen  (TYLENOL ) 325 MG tablet You can take 2 tablets every 6 hours as needed for pain. Use this first then the prescribed pain medication.   DO NOT TAKE MORE THAN 4000 MG OF TYLENOL  PER DAY.  IT CAN  HARM YOUR LIVER.     clotrimazole -betamethasone  (LOTRISONE ) cream Apply 1 application topically daily. 30 g 2   EPINEPHrine  0.3 mg/0.3 mL IJ SOAJ injection Inject 0.3 mg into the muscle as needed.     glucose blood (TRUE METRIX BLOOD GLUCOSE TEST) test strip TEST BLOOD SUGAR ONE TIME DAILY OR AS NEEDED AS DIRECTED 100 strip 3   insulin  glargine (LANTUS  SOLOSTAR) 100 UNIT/ML Solostar Pen Inject 45-50 Units into the skin daily. 45 mL 3   latanoprost (XALATAN) 0.005 % ophthalmic solution      levETIRAcetam  (KEPPRA ) 1000 MG tablet Take 1 tablet (1,000 mg total) by mouth 2 (two) times daily. Take 1 tablet in the AM and 1 tablet at bedtime 28 tablet 0   meloxicam  (MOBIC ) 15 MG tablet TAKE 1 TABLET EVERY DAY WITH FOOD AS NEEDED FOR PAIN 90 tablet 3   metFORMIN  (GLUCOPHAGE -XR) 500 MG 24 hr tablet Take 1 tablet (500 mg total) by mouth daily with breakfast.     nystatin  (MYCOSTATIN /NYSTOP ) powder Apply 1 application. topically 2 (two) times daily. 60 g 0   oxcarbazepine  (TRILEPTAL ) 600 MG tablet Take 1 tablet (600 mg total) by mouth 2 (two) times daily. 180 tablet 3   pravastatin  (PRAVACHOL ) 10 MG tablet Take 1 tablet (10 mg total) by mouth daily. 90 tablet 3   ramipril  (ALTACE ) 1.25 MG capsule TAKE 1 CAPSULE BY MOUTH EVERY DAY 90 capsule 3   triamcinolone  cream (KENALOG ) 0.5 % Apply 1 application topically 2 (two) times daily as needed (for itchy spots on skin). 30 g 2   TRUEPLUS LANCETS 33G MISC Use as instructed to test blood sugar once daily or as needed.  Diagnosis:  E11.65  Non insulin  dependent. 100 each 3   No current facility-administered  medications on file prior to visit.    Allergies  Allergen Reactions   Benadryl [Diphenhydramine Hcl]     Caused seizures.    Yellow Jacket Venom [Honey Bee Venom]     Facial swelling after sting   Metformin  And Related Diarrhea    At higher dose.  Able to tolerate 500mg  daily.     Atorvastatin      aches   Carbamazepine     Inc in seizures   Januvia  [Sitagliptin ]     aches   Lamotrigine     Inc in seizures   Phenobarbital     rash   Wasp Venom Protein     Other reaction(s): Not available     DIAGNOSTIC DATA (LABS, IMAGING, TESTING) - I reviewed patient records, labs, notes, testing and imaging myself where available.  Lab Results  Component Value Date   WBC 10.6 (H) 12/30/2021   HGB 14.9 12/30/2021   HCT 44.7 12/30/2021   MCV 88.7 12/30/2021   PLT 344.0 12/30/2021      Component Value Date/Time   NA 136 12/30/2021 1346   NA 139 01/02/2021 1150   K 4.5 12/30/2021 1346   CL 99 12/30/2021 1346   CO2 29 12/30/2021 1346   GLUCOSE 118 (H) 12/30/2021 1346   BUN 15 12/30/2021 1346   BUN 12 01/02/2021 1150   CREATININE 1.00 12/30/2021 1346   CALCIUM  9.7 12/30/2021 1346   PROT 7.6 12/30/2021 1346   PROT 7.7 01/02/2021 1150   ALBUMIN 4.5 12/30/2021 1346   ALBUMIN 4.4 01/02/2021 1150   AST 18 12/30/2021 1346   ALT 26 12/30/2021 1346   ALKPHOS 88 12/30/2021 1346   BILITOT 0.4 12/30/2021 1346   BILITOT 0.3 01/02/2021 1150  GFRNONAA 87 01/03/2020 1130   GFRAA 100 01/03/2020 1130   Lab Results  Component Value Date   CHOL 161 06/17/2022   HDL 43.70 06/17/2022   LDLCALC 96 06/17/2022   LDLDIRECT 99.0 12/30/2021   TRIG 109.0 06/17/2022   CHOLHDL 4 06/17/2022   Lab Results  Component Value Date   HGBA1C 8.1 (A) 06/12/2023   Lab Results  Component Value Date   VITAMINB12 781 10/21/2006   Lab Results  Component Value Date   TSH 1.14 12/30/2021    PHYSICAL EXAM:  Today's Vitals   10/22/23 0811  BP: 122/76  Pulse: 77  Weight: 212 lb (96.2 kg)   Height: 5' 6 (1.676 m)   Body mass index is 34.22 kg/m.   Wt Readings from Last 3 Encounters:  10/22/23 212 lb (96.2 kg)  06/12/23 210 lb (95.3 kg)  06/10/23 218 lb (98.9 kg)     Ht Readings from Last 3 Encounters:  10/22/23 5' 6 (1.676 m)  06/12/23 5' 6 (1.676 m)  06/10/23 5' 6 (1.676 m)      General: The patient is awake, alert and appears not in acute distress. Untrimmed facial hair, drooling, cleanly dressed.   Normocephalic, atraumatic. Neck is supple. Mallampati 3 plus, neck circumference: 17.5    Facial hair . Cardiovascular:  Regular rate and rhythm , without  murmurs or carotid bruit, and without distended neck veins. Respiratory: Lungs are clear to auscultation. Skin:  Without evidence of edema, but  small blisters on arms and back, that itch and  are interrupting his sleep - slow healing, scaling, and leaving a hyperpigmentation.   Trunk: BMI is just under 35  -the patient has a leaning posture. Right sided weakness.     Neurologic exam : The patient is awake and alert, oriented to place and time.  Memory subjective  described as intact. There is a normal attention span & concentration ability. Speech is fluent with dysarthria, dysphonia but not aphasia.   Mood and affect are appropriate.   Cranial nerves: Pupils are equal and briskly reactive to light. Extraocular movements ; the patient's eyes do not track movement in a coordinated fashion, he does have some nystagmus horizontally little eye bobbing with gaze to the right, up toward and downward gaze however seem to be coordinated and conjugate. Visual fields by finger perimetry are intact. Hearing to finger rub intact.  Facial sensation intact to fine touch. Facial motor weakness on the left -  tongue and uvula move midline. Tongue protrusion into either cheek is devaited to the right . Shoulder shrug is normal.    Motor exam: Mr. Court has a increased muscle tone throughout the ( hemiparetic)  right body  side, which would be his dominant side.   Sensory:  Fine touch, pinprick and vibration were affected throughout the right body.    Coordination: Rapid alternating movements in the fingers/hands were normal. Finger-to-nose maneuver  normal without evidence of ataxia, dysmetria or tremor.   Gait and station: Patient walks without assistance.  He stumbles easily , right sided weakness.    Deep tendon reflexes: in the  R upper and  R lower extremities are spastic right hemiparetic.    ASSESSMENT AND PLAN:  63 y.o. 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  He learnt to walk at 18 months.     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  Keppra  and oxcarbazepine . I will not order a new EEG.  He had an ambulatory EEG and here standart EEG< Had an EMU stay in [place] .  Reportedly suffered  3 seizures - one seizure that put out his back .   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 This is static, unchanged for years.   3) Much better controlled blood glucose now, BMI under 35.   4) skin blisters - could this be MRSA? PCP : Can he have a dermatology referral?   5) refilled meds.   I plan to follow up either personally or through our NP within 12 months.   Will order a CBC diff, and CMET( sodium level). Keppra  and  Oxcarba levels.  Trough today-      I would like to thank Bradley Arlyss RAMAN, Md [address] [place],  [place] [address] for allowing me to meet with and to take care of this pleasant patient.   CC: I will share my notes with .  After spending a total time of  35  minutes face to face and additional time for physical and neurologic examination, review of laboratory studies,  personal review of imaging studies, reports and results of other testing and review of referral information / records as far as  provided in visit,   Electronically signed by: Bradley Gores, MD 10/22/2023 8:33 AM  [facility] Sleep Board certified by The [employer] of Sleep Medicine and Diplomate of the [employer] of Sleep Medicine. Board certified In Neurology through the ABPN, Fellow of the [employer] of Neurology.

## 2023-10-22 NOTE — Addendum Note (Signed)
 Addended by: Neomia Banner on: 10/22/2023 09:09 AM   Modules accepted: Orders

## 2023-10-25 ENCOUNTER — Encounter: Payer: Self-pay | Admitting: Neurology

## 2023-10-25 ENCOUNTER — Other Ambulatory Visit: Payer: Self-pay | Admitting: Neurology

## 2023-10-25 DIAGNOSIS — Z0000 Encounter for general adult medical examination without abnormal findings: Secondary | ICD-10-CM

## 2023-10-25 DIAGNOSIS — G801 Spastic diplegic cerebral palsy: Secondary | ICD-10-CM

## 2023-10-25 DIAGNOSIS — G40309 Generalized idiopathic epilepsy and epileptic syndromes, not intractable, without status epilepticus: Secondary | ICD-10-CM

## 2023-10-25 DIAGNOSIS — M85851 Other specified disorders of bone density and structure, right thigh: Secondary | ICD-10-CM

## 2023-10-25 DIAGNOSIS — E1169 Type 2 diabetes mellitus with other specified complication: Secondary | ICD-10-CM

## 2023-10-25 NOTE — Progress Notes (Signed)
 At risk of osteoporosis.  Ordered dxa scan

## 2023-10-26 ENCOUNTER — Telehealth: Payer: Self-pay | Admitting: *Deleted

## 2023-10-26 NOTE — Telephone Encounter (Signed)
-----   Message from Cliffdell Dohmeier sent at 10/25/2023  4:51 PM EST ----- Bone density test is recommended , high alk phosphatase and many years on seizure medications increase the osteoporosis risk.

## 2023-10-26 NOTE — Telephone Encounter (Signed)
 Left message for pt to call.

## 2023-10-27 ENCOUNTER — Other Ambulatory Visit: Payer: Self-pay | Admitting: *Deleted

## 2023-10-27 LAB — COMPREHENSIVE METABOLIC PANEL
ALT: 20 [IU]/L (ref 0–44)
AST: 15 [IU]/L (ref 0–40)
Albumin: 4.4 g/dL (ref 3.9–4.9)
Alkaline Phosphatase: 126 [IU]/L — ABNORMAL HIGH (ref 44–121)
BUN/Creatinine Ratio: 10 (ref 10–24)
BUN: 10 mg/dL (ref 8–27)
Bilirubin Total: 0.4 mg/dL (ref 0.0–1.2)
CO2: 25 mmol/L (ref 20–29)
Calcium: 9.5 mg/dL (ref 8.6–10.2)
Chloride: 97 mmol/L (ref 96–106)
Creatinine, Ser: 0.97 mg/dL (ref 0.76–1.27)
Globulin, Total: 3.2 g/dL (ref 1.5–4.5)
Glucose: 111 mg/dL — ABNORMAL HIGH (ref 70–99)
Potassium: 4.5 mmol/L (ref 3.5–5.2)
Sodium: 138 mmol/L (ref 134–144)
Total Protein: 7.6 g/dL (ref 6.0–8.5)
eGFR: 88 mL/min/{1.73_m2} (ref >59)

## 2023-10-27 LAB — LEVETIRACETAM LEVEL: Levetiracetam Lvl: 14.6 ug/mL (ref 10.0–40.0)

## 2023-10-27 LAB — CBC WITH DIFFERENTIAL/PLATELET
Basophils Absolute: 0.1 10*3/uL (ref 0.0–0.2)
Basos: 1 %
EOS (ABSOLUTE): 0.1 10*3/uL (ref 0.0–0.4)
Eos: 1 %
Hematocrit: 48.2 % (ref 37.5–51.0)
Hemoglobin: 16.1 g/dL (ref 13.0–17.7)
Immature Grans (Abs): 0.1 10*3/uL (ref 0.0–0.1)
Immature Granulocytes: 1 %
Lymphocytes Absolute: 2.3 10*3/uL (ref 0.7–3.1)
Lymphs: 23 %
MCH: 29.4 pg (ref 26.6–33.0)
MCHC: 33.4 g/dL (ref 31.5–35.7)
MCV: 88 fL (ref 79–97)
Monocytes Absolute: 0.6 10*3/uL (ref 0.1–0.9)
Monocytes: 6 %
Neutrophils Absolute: 6.8 10*3/uL (ref 1.4–7.0)
Neutrophils: 68 %
Platelets: 390 10*3/uL (ref 150–450)
RBC: 5.48 x10E6/uL (ref 4.14–5.80)
RDW: 13.1 % (ref 11.6–15.4)
WBC: 9.9 10*3/uL (ref 3.4–10.8)

## 2023-10-27 LAB — 10-HYDROXYCARBAZEPINE: Oxcarbazepine SerPl-Mcnc: 28 ug/mL (ref 10–35)

## 2023-10-27 MED ORDER — LEVETIRACETAM 1000 MG PO TABS: 1000.0000 mg | ORAL_TABLET | Freq: Two times a day (BID) | ORAL | 0 refills | Status: DC

## 2023-10-27 MED ORDER — LEVETIRACETAM 1000 MG PO TABS: 1000.0000 mg | ORAL_TABLET | Freq: Two times a day (BID) | ORAL | 1 refills | Status: DC

## 2023-10-27 NOTE — Telephone Encounter (Signed)
-----   Message from Cliffdell Dohmeier sent at 10/25/2023  4:51 PM EST ----- Bone density test is recommended , high alk phosphatase and many years on seizure medications increase the osteoporosis risk.

## 2023-10-27 NOTE — Telephone Encounter (Signed)
See result note.

## 2023-10-27 NOTE — Telephone Encounter (Signed)
Pt's wife returned phone call,  transferred to nurse.

## 2023-10-27 NOTE — Telephone Encounter (Signed)
Spoke to wife (checked DPR) gave test results Gave Dr Dohmeier recommendation for done density test  Wife agreeable Made wife aware of two weeks for insurance approval for test . Wife concerned about Keppra refill . Did resend 90 day refill order to [facility] and send a bridge to [facility] on [address] due to the fact it will take [facility] 5-7 days to approve medication and mail out to patient . Wife expressed understanding and thanked me for calling

## 2023-10-28 DIAGNOSIS — H401131 Primary open-angle glaucoma, bilateral, mild stage: Secondary | ICD-10-CM | POA: Diagnosis not present

## 2023-11-12 DIAGNOSIS — H2513 Age-related nuclear cataract, bilateral: Secondary | ICD-10-CM | POA: Diagnosis not present

## 2023-11-12 DIAGNOSIS — E119 Type 2 diabetes mellitus without complications: Secondary | ICD-10-CM | POA: Diagnosis not present

## 2023-11-12 DIAGNOSIS — H401131 Primary open-angle glaucoma, bilateral, mild stage: Secondary | ICD-10-CM | POA: Diagnosis not present

## 2023-11-12 LAB — HM DIABETES EYE EXAM

## 2023-11-30 ENCOUNTER — Ambulatory Visit (HOSPITAL_BASED_OUTPATIENT_CLINIC_OR_DEPARTMENT_OTHER)
Admission: RE | Admit: 2023-11-30 | Discharge: 2023-11-30 | Disposition: A | Discharge status: Home / Self Care | Source: Ambulatory Visit | Attending: Neurology | Admitting: Neurology

## 2023-11-30 DIAGNOSIS — E1169 Type 2 diabetes mellitus with other specified complication: Secondary | ICD-10-CM | POA: Diagnosis not present

## 2023-11-30 DIAGNOSIS — Z794 Long term (current) use of insulin: Secondary | ICD-10-CM | POA: Diagnosis not present

## 2023-11-30 DIAGNOSIS — Z0000 Encounter for general adult medical examination without abnormal findings: Secondary | ICD-10-CM

## 2023-11-30 DIAGNOSIS — M85852 Other specified disorders of bone density and structure, left thigh: Secondary | ICD-10-CM | POA: Insufficient documentation

## 2023-11-30 DIAGNOSIS — G801 Spastic diplegic cerebral palsy: Secondary | ICD-10-CM | POA: Diagnosis not present

## 2023-11-30 DIAGNOSIS — M85851 Other specified disorders of bone density and structure, right thigh: Secondary | ICD-10-CM

## 2023-11-30 DIAGNOSIS — F17200 Nicotine dependence, unspecified, uncomplicated: Secondary | ICD-10-CM | POA: Diagnosis not present

## 2023-11-30 DIAGNOSIS — G40309 Generalized idiopathic epilepsy and epileptic syndromes, not intractable, without status epilepticus: Secondary | ICD-10-CM

## 2023-12-15 ENCOUNTER — Telehealth: Payer: Self-pay

## 2023-12-15 NOTE — Telephone Encounter (Signed)
 Toribio Harbour, CMA Called pt lvm to call office and schedule  appt       Previous Messages    ----- Message ----- From: Donnamarie Poag, CMA Sent: 12/01/2023   1:55 PM EDT To: Quentin Cornwall Mila Merry Admin Subject: FW: please call about seeing up a yearly vis*   ----- Message ----- From: Joaquim Nam, MD Sent: 11/29/2023  11:20 AM EDT To: Albertine Patricia Subject: please call about seeing up a yearly visit. *

## 2023-12-16 NOTE — Telephone Encounter (Signed)
 LVM for patient to c/b and schedule.

## 2023-12-17 NOTE — Telephone Encounter (Signed)
 [email or web address]

## 2023-12-23 ENCOUNTER — Encounter: Payer: Self-pay | Admitting: Neurology

## 2023-12-24 NOTE — Telephone Encounter (Signed)
-----   Message from Anson Fret sent at 12/23/2023  9:11 PM EDT -----  This patient is considered normal according to the  World Health Organization [facility]) criteria

## 2023-12-24 NOTE — Telephone Encounter (Signed)
 Called the pt to advise of the normal finding. There was no answer. Left a detailed message advising of the normal bone density scan and there were no concerns. Advised the pt to call back with any questions or concerns.

## 2023-12-28 ENCOUNTER — Other Ambulatory Visit: Payer: Self-pay | Admitting: Family Medicine

## 2023-12-31 ENCOUNTER — Other Ambulatory Visit: Payer: Self-pay

## 2023-12-31 DIAGNOSIS — E1169 Type 2 diabetes mellitus with other specified complication: Secondary | ICD-10-CM

## 2023-12-31 MED ORDER — RAMIPRIL 1.25 MG PO CAPS: ORAL_CAPSULE | ORAL | 0 refills | Status: DC

## 2024-01-05 ENCOUNTER — Ambulatory Visit: Payer: Medicare HMO | Admitting: Adult Health

## 2024-01-06 ENCOUNTER — Other Ambulatory Visit: Payer: Self-pay | Admitting: Family Medicine

## 2024-01-06 DIAGNOSIS — Z794 Long term (current) use of insulin: Secondary | ICD-10-CM

## 2024-01-06 MED ORDER — RAMIPRIL 1.25 MG PO CAPS: ORAL_CAPSULE | ORAL | 1 refills | Status: DC

## 2024-01-08 ENCOUNTER — Telehealth: Payer: Self-pay

## 2024-01-08 NOTE — Telephone Encounter (Signed)
 [email or web address]

## 2024-01-08 NOTE — Telephone Encounter (Signed)
 Please reach out to schedule patient for his annual visit. Thank you

## 2024-01-13 ENCOUNTER — Encounter: Payer: Self-pay | Admitting: Family Medicine

## 2024-01-13 NOTE — Telephone Encounter (Signed)
 Called [phone] number not available at this time, [phone] just rings and no vm or answering machine. Sent mychart

## 2024-01-13 NOTE — Telephone Encounter (Signed)
 Please try to reach out to patient, Thank you

## 2024-01-15 NOTE — Telephone Encounter (Signed)
 Closing encounter message has been sent to patient to schedule an appointment via mychart

## 2024-02-04 ENCOUNTER — Ambulatory Visit: Payer: Medicare HMO | Admitting: Adult Health

## 2024-02-18 ENCOUNTER — Ambulatory Visit: Admitting: Family Medicine

## 2024-02-18 ENCOUNTER — Encounter: Payer: Self-pay | Admitting: Family Medicine

## 2024-02-18 VITALS — BP 124/78 | HR 71 | Temp 97.8°F | Ht 66.0 in | Wt 214.0 lb

## 2024-02-18 DIAGNOSIS — G720 Drug-induced myopathy: Secondary | ICD-10-CM | POA: Diagnosis not present

## 2024-02-18 DIAGNOSIS — R21 Rash and other nonspecific skin eruption: Secondary | ICD-10-CM

## 2024-02-18 DIAGNOSIS — E1169 Type 2 diabetes mellitus with other specified complication: Secondary | ICD-10-CM | POA: Diagnosis not present

## 2024-02-18 DIAGNOSIS — Z794 Long term (current) use of insulin: Secondary | ICD-10-CM

## 2024-02-18 DIAGNOSIS — H6120 Impacted cerumen, unspecified ear: Secondary | ICD-10-CM | POA: Diagnosis not present

## 2024-02-18 LAB — HEMOGLOBIN A1C: Hgb A1c MFr Bld: 8.6 % — ABNORMAL HIGH (ref 4.6–6.5)

## 2024-02-18 MED ORDER — CLOTRIMAZOLE-BETAMETHASONE 1-0.05 % EX CREA: 1.0000 | TOPICAL_CREAM | Freq: Every day | CUTANEOUS | 1 refills | Status: DC

## 2024-02-18 NOTE — Patient Instructions (Signed)
 I would use the cream in the meantime.  Go to the lab on the way out.   If you have mychart we'll likely use that to update you.    Take care.  Glad to see you.

## 2024-02-18 NOTE — Progress Notes (Signed)
 DM2.  50 units lantus .  Sugars have been 100-120 in the AMs.  Sugar affected by timing of meals.  No lows, lowest was in the 80s.  Labs pending.  Nonfasting.  He is off metformin .  He is off pravastatin .  D/w pt about use. He had cramps on med. Resolved off med.   He had a rash near the perineum.  Itchy.  Ran out of lotrisone .    Ears felt plugged.  Used debrox.  No fevers.  Hearing is affected.    Meds, vitals, and allergies reviewed.   ROS: Per HPI unless specifically indicated in ROS section   Nad Ncat Neck supple, no LA TM WNL on recheck after bilateral cerumen impaction removed with curette.  No complication.  He consented for treatment.  He felt better after cerumen removal bilaterally. Rrr Ctab Bilateral tinea cruris without ulceration.  35 minutes were devoted to patient care in this encounter (this includes time spent reviewing the patient's file/history, interviewing and examining the patient, counseling/reviewing plan with patient).

## 2024-02-19 LAB — LIPID PANEL
Cholesterol: 222 mg/dL — ABNORMAL HIGH (ref 0–200)
HDL: 43.2 mg/dL (ref >39.00)
LDL Cholesterol: 143 mg/dL — ABNORMAL HIGH (ref 0–99)
NonHDL: 179.15
Total CHOL/HDL Ratio: 5
Triglycerides: 182 mg/dL — ABNORMAL HIGH (ref 0.0–149.0)
VLDL: 36.4 mg/dL (ref 0.0–40.0)

## 2024-02-21 ENCOUNTER — Ambulatory Visit: Payer: Self-pay | Admitting: Family Medicine

## 2024-02-21 DIAGNOSIS — G720 Drug-induced myopathy: Secondary | ICD-10-CM | POA: Insufficient documentation

## 2024-02-21 NOTE — Assessment & Plan Note (Signed)
 Statin intolerant

## 2024-02-21 NOTE — Assessment & Plan Note (Signed)
 Restart Lotrisone  and let me know if that does not help.

## 2024-02-21 NOTE — Assessment & Plan Note (Signed)
 See notes on follow-up labs.  He is off metformin .  He did not tolerate pravastatin .  Sugars have been controlled on home check taking 50 units of Lantus .

## 2024-02-21 NOTE — Assessment & Plan Note (Signed)
 Removed with curette without complication.  He felt better.  He consented for treatment.

## 2024-02-29 ENCOUNTER — Other Ambulatory Visit: Payer: Self-pay | Admitting: Neurology

## 2024-03-21 ENCOUNTER — Other Ambulatory Visit: Payer: Self-pay | Admitting: Neurology

## 2024-06-14 ENCOUNTER — Ambulatory Visit (INDEPENDENT_AMBULATORY_CARE_PROVIDER_SITE_OTHER): Payer: Medicare HMO

## 2024-06-14 VITALS — BP 124/78 | Ht 66.0 in | Wt 213.0 lb

## 2024-06-14 DIAGNOSIS — Z2821 Immunization not carried out because of patient refusal: Secondary | ICD-10-CM | POA: Diagnosis not present

## 2024-06-14 DIAGNOSIS — Z0000 Encounter for general adult medical examination without abnormal findings: Secondary | ICD-10-CM | POA: Diagnosis not present

## 2024-06-14 DIAGNOSIS — Z5320 Procedure and treatment not carried out because of patient's decision for unspecified reasons: Secondary | ICD-10-CM | POA: Diagnosis not present

## 2024-06-14 NOTE — Patient Instructions (Signed)
 Bradley Davila,  Thank you for taking the time for your Medicare Wellness Visit. I appreciate your continued commitment to your health goals. Please review the care plan we discussed, and feel free to reach out if I can assist you further.  Medicare recommends these wellness visits once per year to help you and your care team stay ahead of potential health issues. These visits are designed to focus on prevention, allowing your provider to concentrate on managing your acute and chronic conditions during your regular appointments.  Please note that Annual Wellness Visits do not include a physical exam. Some assessments may be limited, especially if the visit was conducted virtually. If needed, we may recommend a separate in-person follow-up with your provider.  Ongoing Care Seeing your primary care provider every 3 to 6 months helps us  monitor your health and provide consistent, personalized care.   Referrals If a referral was made during today's visit and you haven't received any updates within two weeks, please contact the referred provider directly to check on the status.  Recommended Screenings:  Health Maintenance  Topic Date Due   DTaP/Tdap/Td vaccine (1 - Tdap) Never done   Pneumococcal Vaccine for age over 47 (1 of 2 - PCV) Never done   Zoster (Shingles) Vaccine (1 of 2) Never done   Yearly kidney health urinalysis for diabetes  03/31/2015   Colon Cancer Screening  06/17/2019   Flu Shot  Never done   Complete foot exam   06/11/2024   Hemoglobin A1C  08/19/2024   Yearly kidney function blood test for diabetes  10/21/2024   Eye exam for diabetics  11/11/2024   Medicare Annual Wellness Visit  06/14/2025   Hepatitis C Screening  Completed   HIV Screening  Completed   Hepatitis B Vaccine  Aged Out   HPV Vaccine  Aged Out   Meningitis B Vaccine  Aged Out   COVID-19 Vaccine  Discontinued       06/14/2024   10:36 AM  Advanced Directives  Does Patient Have a Medical Advance Directive?  No  Would patient like information on creating a medical advance directive? No - Patient declined   Advance Care Planning is important because it: Ensures you receive medical care that aligns with your values, goals, and preferences. Provides guidance to your family and loved ones, reducing the emotional burden of decision-making during critical moments.  Vision: Annual vision screenings are recommended for early detection of glaucoma, cataracts, and diabetic retinopathy. These exams can also reveal signs of chronic conditions such as diabetes and high blood pressure.  Dental: Annual dental screenings help detect early signs of oral cancer, gum disease, and other conditions linked to overall health, including heart disease and diabetes.  Please see the attached documents for additional preventive care recommendations.

## 2024-06-14 NOTE — Progress Notes (Signed)
 Because this visit was a virtual/telehealth visit,  certain criteria was not obtained, such a blood pressure, CBG if applicable, and timed get up and go. Any medications not marked as taking were not mentioned during the medication reconciliation part of the visit. Any vitals not documented were not able to be obtained due to this being a telehealth visit or patient was unable to self-report a recent blood pressure reading due to a lack of equipment at home via telehealth. Vitals that have been documented are verbally provided by the patient.  This visit was performed by a medical professional under my direct supervision. I was immediately available for consultation/collaboration. I have reviewed and agree with the Annual Wellness Visit documentation.  Subjective:   Bradley Davila is a 63 y.o. who presents for a Medicare Wellness preventive visit.  As a reminder, Annual Wellness Visits don't include a physical exam, and some assessments may be limited, especially if this visit is performed virtually. We may recommend an in-person follow-up visit with your provider if needed.  Visit Complete: Virtual I connected with  Bradley Davila on 06/14/24 by a audio enabled telemedicine application and verified that I am speaking with the correct person using two identifiers.  Patient Location: Home  Provider Location: Home Office  I discussed the limitations of evaluation and management by telemedicine. The patient expressed understanding and agreed to proceed.  Vital Signs: Because this visit was a virtual/telehealth visit, some criteria may be missing or patient reported. Any vitals not documented were not able to be obtained and vitals that have been documented are patient reported.  VideoDeclined- This patient declined Librarian, academic. Therefore the visit was completed with audio only.  Persons Participating in Visit: Patient.  AWV Questionnaire: No: Patient Medicare  AWV questionnaire was not completed prior to this visit.  Cardiac Risk Factors include: advanced age (>78men, >14 women);hypertension;diabetes mellitus;obesity (BMI >30kg/m2)     Objective:    Today's Vitals   06/14/24 1029 06/14/24 1030  BP: 124/78   Weight: 213 lb (96.6 kg)   Height: 5' 6 (1.676 m)   PainSc:  7    Body mass index is 34.38 kg/m.     06/14/2024   10:36 AM 06/10/2023    1:46 PM 02/18/2022    9:37 AM 08/21/2021   10:45 AM 07/01/2019   12:17 PM 06/10/2019    2:44 PM 10/26/2018    8:35 AM  Advanced Directives  Does Patient Have a Medical Advance Directive? No No No No No No No   Would patient like information on creating a medical advance directive? No - Patient declined  No - Patient declined No - Patient declined No - Patient declined No - Patient declined Yes (MAU/Ambulatory/Procedural Areas - Information given)      Data saved with a previous flowsheet row definition    Current Medications (verified) Outpatient Encounter Medications as of 06/14/2024  Medication Sig   acetaminophen  (TYLENOL ) 325 MG tablet You can take 2 tablets every 6 hours as needed for pain. Use this first then the prescribed pain medication.   DO NOT TAKE MORE THAN 4000 MG OF TYLENOL  PER DAY.  IT CAN HARM YOUR LIVER.   clotrimazole -betamethasone  (LOTRISONE ) cream Apply 1 Application topically daily.   EPINEPHrine  0.3 mg/0.3 mL IJ SOAJ injection Inject 0.3 mg into the muscle as needed.   glucose blood (TRUE METRIX BLOOD GLUCOSE TEST) test strip TEST BLOOD SUGAR ONE TIME DAILY OR AS NEEDED AS DIRECTED  insulin  glargine (LANTUS  SOLOSTAR) 100 UNIT/ML Solostar Pen INJECT 45-50 UNITS INTO THE SKIN DAILY.   latanoprost (XALATAN) 0.005 % ophthalmic solution    levETIRAcetam  (KEPPRA ) 1000 MG tablet TAKE 1 TABLET TWICE DAILY IN THE MORNING AND AT BEDTIME   meloxicam  (MOBIC ) 15 MG tablet TAKE 1 TABLET EVERY DAY WITH FOOD AS NEEDED FOR PAIN   nystatin  (MYCOSTATIN /NYSTOP ) powder Apply 1 application.  topically 2 (two) times daily.   oxcarbazepine  (TRILEPTAL ) 600 MG tablet TAKE 1 TABLET TWICE DAILY   ramipril  (ALTACE ) 1.25 MG capsule TAKE 1 CAPSULE BY MOUTH EVERY DAY   triamcinolone  cream (KENALOG ) 0.5 % Apply 1 application topically 2 (two) times daily as needed (for itchy spots on skin).   TRUEPLUS LANCETS 33G MISC Use as instructed to test blood sugar once daily or as needed.  Diagnosis:  E11.65  Non insulin  dependent.   No facility-administered encounter medications on file as of 06/14/2024.    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   Diabetes mellitus without complication (HCC)    Dysphagia as late effect of stroke    Headache(784.0)    Hypertension    Seizures (HCC)    h/o grand mal and staring episodes, followed by [facility] neuro, last grand mal July 09, 2013 last seizure   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    Stroke [facility])    childhood   Past Surgical History:  Procedure Laterality Date   CHOLECYSTECTOMY  2007   CYSTECTOMY     on buttock, s/p removal   HERNIA REPAIR     INSERTION OF MESH N/A 08/05/2017   Procedure: INSERTION OF MESH;  Surgeon: Vernetta Berg, MD;  Location: [facility] OR;  Service: General;  Laterality: N/A;   TONSILLECTOMY     UMBILICAL HERNIA REPAIR  08/05/2017   UMBILICAL HERNIA REPAIR N/A 08/05/2017   Procedure: UMBILICAL HERNIA REPAIR;  Surgeon: Vernetta Berg, MD;  Location: [facility] OR;  Service: General;  Laterality: N/A;   WISDOM TOOTH EXTRACTION     Family History  Problem Relation Age of Onset    Hydrocephalus Mother    Liver cancer Father    Alcohol abuse Father    Alcohol abuse Sister    Heart disease Brother    Colon cancer Neg Hx    Prostate cancer Neg Hx    Seizures Neg Hx    Social History   Socioeconomic History   Marital status: Married    Spouse name: Zebedee   Number of children: 1   Years of education: Not on file   Highest education level: Not on file  Occupational History    Employer: NOT EMPLOYED  Tobacco Use   Smoking status: Some Days    Types: Cigars   Smokeless tobacco: Never   Tobacco comments:    Pt smokes 7 cigars a week   Vaping Use   Vaping status: Never Used  Substance and Sexual Activity   Alcohol use: Yes    Comment: rarley   Drug use: No   Sexual activity: Yes  Other Topics Concern   Not on file  Social History Narrative  Married 2005, 1 daughter Disabled due to cva      Caffeine: soda, all day long    Pt lives wife   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 [employer] Strain: Medium Risk (06/14/2024)   Overall Financial Resource Strain (CARDIA)    Difficulty of Paying Living Expenses: Somewhat hard  Food Insecurity: No Food Insecurity (06/14/2024)   Hunger Vital Sign    Worried About Running Out of Food in the Last Year: Never true    Ran Out of Food in the Last Year: Never true  Transportation Needs: No Transportation Needs (06/14/2024)   PRAPARE - Administrator, Civil Service (Medical): No    Lack of Transportation (Non-Medical): No  Physical Activity: Insufficiently Active (06/14/2024)   Exercise Vital Sign    Days of Exercise per Week: 6 days    Minutes of Exercise per Session: 10 min  Stress: No Stress Concern Present (06/14/2024)   [employer] of Occupational Health - Occupational Stress Questionnaire    Feeling of Stress: Only a little  Social Connections: Moderately Isolated (06/14/2024)   Social Connection and Isolation Panel    Frequency of Communication with Friends and Family: Three  times a week    Frequency of Social Gatherings with Friends and Family: Never    Attends Religious Services: Never    Database administrator or Organizations: No    Attends Engineer, structural: Never    Marital Status: Married    Tobacco Counseling Ready to quit: Not Answered Counseling given: Not Answered Tobacco comments: Pt smokes 7 cigars a week     Clinical Intake:  Pre-visit preparation completed: Yes  Pain : 0-10 Pain Score: 7  Pain Type: Neuropathic pain     BMI - recorded: 34.38 Nutritional Status: BMI > 30  Obese Nutritional Risks: None Diabetes: Yes CBG done?: No Did pt. bring in CBG monitor from home?: No  Lab Results  Component Value Date   HGBA1C 8.6 (H) 02/18/2024   HGBA1C 8.1 (A) 06/12/2023   HGBA1C 8.6 (A) 03/30/2023     How often do you need to have someone help you when you read instructions, pamphlets, or other written materials from your doctor or pharmacy?: 1 - Never  Interpreter Needed?: No  Information entered by :: Allea Kassner,CMA   Activities of Daily Living     06/14/2024   10:33 AM  In your present state of health, do you have any difficulty performing the following activities:  Hearing? 0  Vision? 0  Difficulty concentrating or making decisions? 0  Walking or climbing stairs? 0  Dressing or bathing? 0  Doing errands, shopping? 0  Preparing Food and eating ? N  Using the Toilet? N  In the past six months, have you accidently leaked urine? N  Do you have problems with loss of bowel control? N  Managing your Medications? N  Managing your Finances? N  Housekeeping or managing your Housekeeping? N    Patient Care Team: Cleatus Arlyss RAMAN, MD as PCP - General (Family Medicine) Myrna Adine Anes, MD as Consulting Physician (Ophthalmology) Fate Morna SAILOR, [facility] (Inactive) as Pharmacist (Pharmacist)  I have updated your Care Teams any recent Medical Services you may have received from other providers in the  past year.     Assessment:   This is a routine wellness examination for Bradley Davila.  Hearing/Vision screen Hearing Screening - Comments:: Patient has some difficulties  Vision Screening - Comments::  Patient wears glasses    Goals Addressed             This Visit's Progress    DIET - INCREASE WATER INTAKE   On track    Starting 10/26/2018, I will attempt to drink at least 6 glasses of water daily.        Depression Screen     06/14/2024   10:37 AM 02/18/2024   11:06 AM 06/12/2023    4:12 PM 06/10/2023    1:42 PM 11/24/2022    3:59 PM 02/18/2022    9:35 AM 08/21/2021   10:45 AM  PHQ 2/9 Scores  PHQ - 2 Score 0 3 3 3 3  0 0  PHQ- 9 Score 1 8 10 11 11       Fall Risk     06/14/2024   10:36 AM 02/18/2024   11:06 AM 06/12/2023    4:12 PM 06/10/2023    1:39 PM 03/30/2023    4:13 PM  Fall Risk   Falls in the past year? 0 0 0 0 0  Number falls in past yr: 0 0 0 0 0  Injury with Fall? 0 0 0 0 0  Risk for fall due to : No Fall Risks No Fall Risks No Fall Risks No Fall Risks No Fall Risks  Follow up Falls evaluation completed Falls evaluation completed Falls evaluation completed Education provided;Falls prevention discussed Falls evaluation completed    MEDICARE RISK AT HOME:  Medicare Risk at Home Any stairs in or around the home?: Yes If so, are there any without handrails?: No Home free of loose throw rugs in walkways, pet beds, electrical cords, etc?: Yes Adequate lighting in your home to reduce risk of falls?: Yes Life alert?: No Use of a cane, walker or w/c?: No Grab bars in the bathroom?: No Shower chair or bench in shower?: Yes Elevated toilet seat or a handicapped toilet?: Yes  TIMED UP AND GO:  Was the test performed?  No  Cognitive Function: 6CIT completed    10/26/2018    8:35 AM 10/16/2017   10:16 AM  MMSE - Mini Mental State Exam  Orientation to time 5 5   Orientation to Place 5 5   Registration 3 3   Attention/ Calculation 0 0   Recall 2 3   Recall-comments  unable to recall 1 of 3 words   Language- name 2 objects 0 0   Language- repeat 1 1  Language- follow 3 step command 3 3   Language- read & follow direction 0 0   Write a sentence 0 0   Copy design 0 0   Total score 19 20      Data saved with a previous flowsheet row definition        06/14/2024   10:31 AM 06/10/2023    1:50 PM 05/12/2021    8:55 AM  6CIT Screen  What Year? 0 points 0 points 0 points  What month? 0 points 0 points 0 points  What time? 0 points 0 points 0 points  Count back from 20 0 points 0 points 0 points  Months in reverse 0 points -- 0 points  Repeat phrase 0 points  0 points  Total Score 0 points  0 points    Immunizations Immunization History  Administered Date(s) Administered   PFIZER(Purple Top)SARS-COV-2 Vaccination 12/03/2019, 01/03/2020, 11/17/2020    Screening Tests Health Maintenance  Topic Date Due   DTaP/Tdap/Td (1 - Tdap) Never done   Pneumococcal  Vaccine: 50+ Years (1 of 2 - PCV) Never done   Zoster Vaccines- Shingrix (1 of 2) Never done   Diabetic kidney evaluation - Urine ACR  03/31/2015   Colonoscopy  06/17/2019   Influenza Vaccine  Never done   FOOT EXAM  06/11/2024   HEMOGLOBIN A1C  08/19/2024   Diabetic kidney evaluation - eGFR measurement  10/21/2024   OPHTHALMOLOGY EXAM  11/11/2024   Medicare Annual Wellness (AWV)  06/14/2025   Hepatitis C Screening  Completed   HIV Screening  Completed   Hepatitis B Vaccines 19-59 Average Risk  Aged Out   HPV VACCINES  Aged Out   Meningococcal B Vaccine  Aged Out   COVID-19 Vaccine  Discontinued    Health Maintenance Items Addressed:patient declined   Additional Screening:  Vision Screening: Recommended annual ophthalmology exams for early detection of glaucoma and other disorders of the eye. Is the patient up to date with their annual eye exam?  No  Who is the provider or what is the name of the office in which the patient attends annual eye exams?   Dental Screening: Recommended  annual dental exams for proper oral hygiene  Community Resource Referral / Chronic Care Management: CRR required this visit?  No   CCM required this visit?  No   Plan:    I have personally reviewed and noted the following in the patient's chart:   Medical and social history Use of alcohol, tobacco or illicit drugs  Current medications and supplements including opioid prescriptions. Patient is not currently taking opioid prescriptions. Functional ability and status Nutritional status Physical activity Advanced directives List of other physicians Hospitalizations, surgeries, and ER visits in previous 12 months Vitals Screenings to include cognitive, depression, and falls Referrals and appointments  In addition, I have reviewed and discussed with patient certain preventive protocols, quality metrics, and best practice recommendations. A written personalized care plan for preventive services as well as general preventive health recommendations were provided to patient.   Lyle MARLA Right, [place]   06/14/2024   After Visit Summary: (MyChart) Due to this being a telephonic visit, the after visit summary with patients personalized plan was offered to patient via MyChart   Notes: Nothing significant to report at this time.

## 2024-06-24 ENCOUNTER — Other Ambulatory Visit: Payer: Self-pay | Admitting: Family Medicine

## 2024-07-04 ENCOUNTER — Other Ambulatory Visit: Payer: Self-pay | Admitting: Family Medicine

## 2024-07-04 DIAGNOSIS — R21 Rash and other nonspecific skin eruption: Secondary | ICD-10-CM

## 2024-07-04 NOTE — Telephone Encounter (Unsigned)
 Copied from CRM #[id number]. Topic: Clinical - Medication Refill >> Jul 04, 2024  4:56 PM Sasha M wrote: Medication: clotrimazole -betamethasone  (LOTRISONE ) cream  Has the patient contacted their pharmacy? Yes (Agent: If no, request that the patient contact the pharmacy for the refill. If patient does not wish to contact the pharmacy document the reason why and proceed with request.) (Agent: If yes, when and what did the pharmacy advise?) Pharmacy called to find out if we received their refill request  This is the patient's preferred pharmacy:  [facility] - [place], [place] - [address] [place] [address] Phone: [phone] Fax: [phone]  Is this the correct pharmacy for this prescription? Yes If no, delete pharmacy and type the correct one.   Has the prescription been filled recently? No  Is the patient out of the medication? Yes  Has the patient been seen for an appointment in the last year OR does the patient have an upcoming appointment? Yes  Can we respond through MyChart? No  Agent: Please be advised that Rx refills may take up to 3 business days. We ask that you follow-up with your pharmacy.

## 2024-07-05 NOTE — Telephone Encounter (Signed)
 [facility]  crm Last filled:  02/18/24, #45 g Last OV:  02/18/24, DM f/u Next OV:  none

## 2024-07-06 ENCOUNTER — Other Ambulatory Visit: Payer: Self-pay

## 2024-07-06 MED ORDER — CLOTRIMAZOLE-BETAMETHASONE 1-0.05 % EX CREA
1.0000 | TOPICAL_CREAM | Freq: Every day | CUTANEOUS | 1 refills | Status: AC
Start: 2024-07-06 — End: ?

## 2024-07-28 ENCOUNTER — Encounter: Payer: Self-pay | Admitting: Pharmacist

## 2024-07-28 NOTE — Progress Notes (Signed)
 Pharmacy Quality Measure Review  This patient is appearing on a report for being at risk of failing the Kidney Health Evaluation for Patients with Diabetes measure this calendar year.   Last documented UACR: 2015 Lab Results  Component Value Date   MICRALBCREAT 13.3 03/30/2014   GFR: Feb 2025  Future UACR already ordered.  Reminder to collect placed in encounter notes 08/04/24  Future Appointments  Date Time Provider Department Center  08/04/2024 11:00 AM Cleatus Arlyss RAMAN, MD [facility] [address]  10/27/2024  3:30 PM Dohmeier, Dedra, MD [facility] None  06/27/2025 10:10 AM [facility] ANNUAL WELLNESS VISIT 2 [facility] [address]

## 2024-08-04 ENCOUNTER — Encounter: Payer: Self-pay | Admitting: Family Medicine

## 2024-08-04 ENCOUNTER — Ambulatory Visit (INDEPENDENT_AMBULATORY_CARE_PROVIDER_SITE_OTHER): Admitting: Family Medicine

## 2024-08-04 VITALS — BP 124/82 | HR 79 | Temp 97.8°F | Ht 66.0 in | Wt 215.4 lb

## 2024-08-04 DIAGNOSIS — E1169 Type 2 diabetes mellitus with other specified complication: Secondary | ICD-10-CM

## 2024-08-04 DIAGNOSIS — Z794 Long term (current) use of insulin: Secondary | ICD-10-CM

## 2024-08-04 LAB — MICROALBUMIN / CREATININE URINE RATIO
Creatinine,U: 163.9 mg/dL
Microalb Creat Ratio: 56.1 mg/g — ABNORMAL HIGH (ref 0.0–30.0)
Microalb, Ur: 9.2 mg/dL — ABNORMAL HIGH (ref 0.0–1.9)

## 2024-08-04 LAB — POCT GLYCOSYLATED HEMOGLOBIN (HGB A1C): Hemoglobin A1C: 8.8 % — AB (ref 4.0–5.6)

## 2024-08-04 MED ORDER — RAMIPRIL 1.25 MG PO CAPS: ORAL_CAPSULE | ORAL | 1 refills | Status: AC

## 2024-08-04 MED ORDER — EMPAGLIFLOZIN 10 MG PO TABS
10.0000 mg | ORAL_TABLET | Freq: Every day | ORAL | 3 refills | Status: AC
Start: 2024-08-04 — End: ?

## 2024-08-04 MED ORDER — LANTUS SOLOSTAR 100 UNIT/ML ~~LOC~~ SOPN: 45.0000 [IU] | PEN_INJECTOR | Freq: Every day | SUBCUTANEOUS | 1 refills | Status: AC

## 2024-08-04 MED ORDER — TRUE METRIX BLOOD GLUCOSE TEST VI STRP: ORAL_STRIP | 3 refills | Status: AC

## 2024-08-04 MED ORDER — TRUEPLUS LANCETS 33G MISC: 3 refills | Status: AC

## 2024-08-04 NOTE — Progress Notes (Signed)
 Diabetes:  Using medications without difficulties: yes Hypoglycemic episodes:no Hyperglycemic episodes:no Feet problems: tingling noted on the distal feet.   Blood Sugars averaging: usually ~130 in the AMs, 109 this AM.  Lowest was 89.   eye exam within last year: yes A1c 8.8.   MALB pending.   Taking 50 units insulin .   Sugar has been diet dependent.    Meds, vitals, and allergies reviewed.   ROS: Per HPI unless specifically indicated in ROS section   GEN: nad, alert and oriented HEENT: mucous membranes moist NECK: supple w/o LA CV: rrr. PULM: ctab, no inc wob ABD: soft, +bs EXT: no edema SKIN: no acute rash  Diabetic foot exam: Normal inspection No skin breakdown No calluses  Normal DP pulses Normal sensation to light touch but dec to monofilament Nails normal

## 2024-08-04 NOTE — Patient Instructions (Addendum)
 Go to the lab on the way out.   If you have mychart we'll likely use that to update you.    Let me know if you can't get jardiance  filled or if you can't tolerate taking it.  Recheck at a yearly visit in about 4 months.  Take care.  Glad to see you.

## 2024-08-07 NOTE — Assessment & Plan Note (Signed)
 A1c 8.8.   MALB pending.   Taking 50 units insulin .   Sugar has been diet dependent.    see notes on labs. He can update me if he cannot get jardiance  filled or if he can't tolerate taking it.  Routine cautions given to patient. Recheck at a yearly visit in about 4 months.

## 2024-09-13 ENCOUNTER — Encounter: Payer: Self-pay | Admitting: Family Medicine

## 2024-10-27 ENCOUNTER — Ambulatory Visit: Payer: Medicare HMO | Admitting: Neurology

## 2024-12-02 ENCOUNTER — Encounter: Admitting: Family Medicine

## 2025-06-27 ENCOUNTER — Ambulatory Visit
